# Patient Record
Sex: Female | Born: 1960 | Hispanic: Yes | Smoking: Former smoker
Health system: Southern US, Community
[De-identification: ages and names within clinical notes are randomized; demographics above are authoritative.]

## PROBLEM LIST (undated history)

## (undated) DIAGNOSIS — G894 Chronic pain syndrome: Secondary | ICD-10-CM

## (undated) DIAGNOSIS — M797 Fibromyalgia: Secondary | ICD-10-CM

## (undated) DIAGNOSIS — E119 Type 2 diabetes mellitus without complications: Secondary | ICD-10-CM

## (undated) DIAGNOSIS — I10 Essential (primary) hypertension: Secondary | ICD-10-CM

## (undated) DIAGNOSIS — J449 Chronic obstructive pulmonary disease, unspecified: Secondary | ICD-10-CM

## (undated) HISTORY — PX: COLONOSCOPY: SHX174

## (undated) HISTORY — PX: ESOPHAGOGASTRODUODENOSCOPY: SHX1529

## (undated) HISTORY — PX: HERNIA REPAIR: SHX51

## (undated) HISTORY — PX: OOPHORECTOMY: SHX86

## (undated) HISTORY — PX: APPENDECTOMY: SHX54

## (undated) HISTORY — PX: TUBAL LIGATION: SHX77

---

## 2011-07-21 DIAGNOSIS — K219 Gastro-esophageal reflux disease without esophagitis: Secondary | ICD-10-CM | POA: Insufficient documentation

## 2011-07-21 DIAGNOSIS — K5790 Diverticulosis of intestine, part unspecified, without perforation or abscess without bleeding: Secondary | ICD-10-CM | POA: Insufficient documentation

## 2011-07-21 DIAGNOSIS — F331 Major depressive disorder, recurrent, moderate: Secondary | ICD-10-CM | POA: Insufficient documentation

## 2011-07-21 DIAGNOSIS — M797 Fibromyalgia: Secondary | ICD-10-CM | POA: Insufficient documentation

## 2012-03-01 DIAGNOSIS — Z720 Tobacco use: Secondary | ICD-10-CM | POA: Insufficient documentation

## 2012-07-11 DIAGNOSIS — J449 Chronic obstructive pulmonary disease, unspecified: Secondary | ICD-10-CM | POA: Diagnosis present

## 2012-08-28 DIAGNOSIS — K432 Incisional hernia without obstruction or gangrene: Secondary | ICD-10-CM | POA: Insufficient documentation

## 2013-03-13 DIAGNOSIS — F1190 Opioid use, unspecified, uncomplicated: Secondary | ICD-10-CM | POA: Insufficient documentation

## 2013-03-13 DIAGNOSIS — M51369 Other intervertebral disc degeneration, lumbar region without mention of lumbar back pain or lower extremity pain: Secondary | ICD-10-CM | POA: Insufficient documentation

## 2013-03-13 DIAGNOSIS — T1491XA Suicide attempt, initial encounter: Secondary | ICD-10-CM | POA: Insufficient documentation

## 2013-03-13 DIAGNOSIS — M5136 Other intervertebral disc degeneration, lumbar region: Secondary | ICD-10-CM | POA: Insufficient documentation

## 2013-03-20 HISTORY — PX: EPIDURAL BLOOD PATCH: SHX1517

## 2013-05-01 DIAGNOSIS — E1165 Type 2 diabetes mellitus with hyperglycemia: Secondary | ICD-10-CM | POA: Insufficient documentation

## 2013-05-01 DIAGNOSIS — E119 Type 2 diabetes mellitus without complications: Secondary | ICD-10-CM | POA: Insufficient documentation

## 2013-05-01 DIAGNOSIS — Z794 Long term (current) use of insulin: Secondary | ICD-10-CM | POA: Insufficient documentation

## 2014-12-09 DIAGNOSIS — N3946 Mixed incontinence: Secondary | ICD-10-CM | POA: Insufficient documentation

## 2015-04-20 DIAGNOSIS — M87051 Idiopathic aseptic necrosis of right femur: Secondary | ICD-10-CM | POA: Insufficient documentation

## 2015-04-20 DIAGNOSIS — M87052 Idiopathic aseptic necrosis of left femur: Secondary | ICD-10-CM

## 2015-11-11 DIAGNOSIS — G4719 Other hypersomnia: Secondary | ICD-10-CM | POA: Insufficient documentation

## 2016-02-18 DIAGNOSIS — Z6839 Body mass index (BMI) 39.0-39.9, adult: Secondary | ICD-10-CM | POA: Insufficient documentation

## 2016-02-18 DIAGNOSIS — E66813 Obesity, class 3: Secondary | ICD-10-CM | POA: Insufficient documentation

## 2016-06-11 DIAGNOSIS — K635 Polyp of colon: Secondary | ICD-10-CM | POA: Insufficient documentation

## 2016-10-06 DIAGNOSIS — M461 Sacroiliitis, not elsewhere classified: Secondary | ICD-10-CM | POA: Insufficient documentation

## 2017-01-12 DIAGNOSIS — K439 Ventral hernia without obstruction or gangrene: Secondary | ICD-10-CM | POA: Insufficient documentation

## 2017-01-24 DIAGNOSIS — M2550 Pain in unspecified joint: Secondary | ICD-10-CM | POA: Insufficient documentation

## 2017-03-28 ENCOUNTER — Emergency Department
Admission: EM | Admit: 2017-03-28 | Discharge: 2017-03-28 | Disposition: A | Discharge status: Home / Self Care | Payer: Medicare (Managed Care) | Attending: Emergency Medicine | Admitting: Emergency Medicine

## 2017-03-28 ENCOUNTER — Encounter: Payer: Self-pay | Admitting: Emergency Medicine

## 2017-03-28 ENCOUNTER — Emergency Department: Payer: Medicare (Managed Care)

## 2017-03-28 DIAGNOSIS — J441 Chronic obstructive pulmonary disease with (acute) exacerbation: Secondary | ICD-10-CM

## 2017-03-28 DIAGNOSIS — E119 Type 2 diabetes mellitus without complications: Secondary | ICD-10-CM | POA: Insufficient documentation

## 2017-03-28 DIAGNOSIS — I10 Essential (primary) hypertension: Secondary | ICD-10-CM | POA: Insufficient documentation

## 2017-03-28 DIAGNOSIS — J45909 Unspecified asthma, uncomplicated: Secondary | ICD-10-CM | POA: Insufficient documentation

## 2017-03-28 DIAGNOSIS — Z87891 Personal history of nicotine dependence: Secondary | ICD-10-CM | POA: Insufficient documentation

## 2017-03-28 HISTORY — DX: Chronic obstructive pulmonary disease, unspecified: J44.9

## 2017-03-28 HISTORY — DX: Type 2 diabetes mellitus without complications: E11.9

## 2017-03-28 LAB — COMPREHENSIVE METABOLIC PANEL
ALBUMIN: 3.6 g/dL (ref 3.5–5.0)
ALT: 31 U/L (ref 14–54)
ANION GAP: 9 (ref 5–15)
AST: 51 U/L — ABNORMAL HIGH (ref 15–41)
Alkaline Phosphatase: 105 U/L (ref 38–126)
BILIRUBIN TOTAL: 1 mg/dL (ref 0.3–1.2)
BUN: 7 mg/dL (ref 6–20)
CHLORIDE: 101 mmol/L (ref 101–111)
CO2: 24 mmol/L (ref 22–32)
Calcium: 8.5 mg/dL — ABNORMAL LOW (ref 8.9–10.3)
Creatinine, Ser: 0.65 mg/dL (ref 0.44–1.00)
GFR calc Af Amer: >60 mL/min (ref >60)
GFR calc non Af Amer: >60 mL/min (ref >60)
GLUCOSE: 132 mg/dL — AB (ref 65–99)
POTASSIUM: 3.6 mmol/L (ref 3.5–5.1)
SODIUM: 134 mmol/L — AB (ref 135–145)
TOTAL PROTEIN: 8.9 g/dL — AB (ref 6.5–8.1)

## 2017-03-28 LAB — BLOOD GAS, VENOUS
Acid-Base Excess: 1.7 mmol/L (ref 0.0–2.0)
BICARBONATE: 25.8 mmol/L (ref 20.0–28.0)
O2 Saturation: 95.9 %
PATIENT TEMPERATURE: 37
PO2 VEN: 78 mmHg — AB (ref 32.0–45.0)
pCO2, Ven: 38 mmHg — ABNORMAL LOW (ref 44.0–60.0)
pH, Ven: 7.44 — ABNORMAL HIGH (ref 7.250–7.430)

## 2017-03-28 LAB — CBC WITH DIFFERENTIAL/PLATELET
BASOS PCT: 0 %
Basophils Absolute: 0 10*3/uL (ref 0–0.1)
EOS ABS: 0.2 10*3/uL (ref 0–0.7)
EOS PCT: 1 %
HEMATOCRIT: 37.3 % (ref 35.0–47.0)
Hemoglobin: 12.8 g/dL (ref 12.0–16.0)
Lymphocytes Relative: 18 %
Lymphs Abs: 2.2 10*3/uL (ref 1.0–3.6)
MCH: 30.3 pg (ref 26.0–34.0)
MCHC: 34.3 g/dL (ref 32.0–36.0)
MCV: 88.4 fL (ref 80.0–100.0)
MONO ABS: 0.6 10*3/uL (ref 0.2–0.9)
MONOS PCT: 5 %
Neutro Abs: 9.3 10*3/uL — ABNORMAL HIGH (ref 1.4–6.5)
Neutrophils Relative %: 76 %
PLATELETS: 163 10*3/uL (ref 150–440)
RBC: 4.22 MIL/uL (ref 3.80–5.20)
RDW: 13.8 % (ref 11.5–14.5)
WBC: 12.4 10*3/uL — ABNORMAL HIGH (ref 3.6–11.0)

## 2017-03-28 MED ORDER — AZITHROMYCIN 250 MG PO TABS: ORAL_TABLET | ORAL | 0 refills | Status: DC

## 2017-03-28 MED ORDER — IPRATROPIUM-ALBUTEROL 0.5-2.5 (3) MG/3ML IN SOLN
3.0000 mL | Freq: Once | RESPIRATORY_TRACT | Status: AC
  Administered 2017-03-28: 3 mL via RESPIRATORY_TRACT
  Filled 2017-03-28: qty 3

## 2017-03-28 MED ORDER — MAGNESIUM SULFATE 2 GM/50ML IV SOLN
2.0000 g | Freq: Once | INTRAVENOUS | Status: AC
  Administered 2017-03-28: 2 g via INTRAVENOUS
  Filled 2017-03-28: qty 50

## 2017-03-28 MED ORDER — PREDNISONE 10 MG PO TABS: 50.0000 mg | ORAL_TABLET | Freq: Every day | ORAL | 0 refills | Status: DC

## 2017-03-28 MED ORDER — HALOPERIDOL LACTATE 5 MG/ML IJ SOLN
5.0000 mg | Freq: Once | INTRAMUSCULAR | Status: AC
  Administered 2017-03-28: 5 mg via INTRAVENOUS

## 2017-03-28 MED ORDER — ALBUTEROL SULFATE HFA 108 (90 BASE) MCG/ACT IN AERS: 2.0000 | INHALATION_SPRAY | Freq: Four times a day (QID) | RESPIRATORY_TRACT | 2 refills | Status: DC | PRN

## 2017-03-28 MED ORDER — SPACER/AERO CHAMBER MOUTHPIECE MISC: 1.0000 [IU] | 0 refills | Status: DC | PRN

## 2017-03-28 MED ORDER — METHYLPREDNISOLONE SODIUM SUCC 125 MG IJ SOLR
125.0000 mg | Freq: Once | INTRAMUSCULAR | Status: AC
  Administered 2017-03-28: 125 mg via INTRAVENOUS
  Filled 2017-03-28: qty 2

## 2017-03-28 MED ORDER — HALOPERIDOL LACTATE 5 MG/ML IJ SOLN
INTRAMUSCULAR | Status: AC
  Filled 2017-03-28: qty 1

## 2017-03-28 NOTE — Discharge Instructions (Signed)
Please take all of your medications as prescribed and follow-up with the primary care physician in 2 days for recheck. Return to the emergency department for any concerns.  It was a pleasure to take care of you today, and thank you for coming to our emergency department.  If you have any questions or concerns before leaving please ask the nurse to grab me and I'm more than happy to go through your aftercare instructions again.  If you were prescribed any opioid pain medication today such as Norco, Vicodin, Percocet, morphine, hydrocodone, or oxycodone please make sure you do not drive when you are taking this medication as it can alter your ability to drive safely.  If you have any concerns once you are home that you are not improving or are in fact getting worse before you can make it to your follow-up appointment, please do not hesitate to call 911 and come back for further evaluation.  Darel Hong MD  Results for orders placed or performed during the hospital encounter of 03/28/17  CBC with Differential  Result Value Ref Range   WBC 12.4 (H) 3.6 - 11.0 K/uL   RBC 4.22 3.80 - 5.20 MIL/uL   Hemoglobin 12.8 12.0 - 16.0 g/dL   HCT 37.3 35.0 - 47.0 %   MCV 88.4 80.0 - 100.0 fL   MCH 30.3 26.0 - 34.0 pg   MCHC 34.3 32.0 - 36.0 g/dL   RDW 13.8 11.5 - 14.5 %   Platelets 163 150 - 440 K/uL   Neutrophils Relative % 76 %   Neutro Abs 9.3 (H) 1.4 - 6.5 K/uL   Lymphocytes Relative 18 %   Lymphs Abs 2.2 1.0 - 3.6 K/uL   Monocytes Relative 5 %   Monocytes Absolute 0.6 0.2 - 0.9 K/uL   Eosinophils Relative 1 %   Eosinophils Absolute 0.2 0 - 0.7 K/uL   Basophils Relative 0 %   Basophils Absolute 0.0 0 - 0.1 K/uL  Comprehensive metabolic panel  Result Value Ref Range   Sodium 134 (L) 135 - 145 mmol/L   Potassium 3.6 3.5 - 5.1 mmol/L   Chloride 101 101 - 111 mmol/L   CO2 24 22 - 32 mmol/L   Glucose, Bld 132 (H) 65 - 99 mg/dL   BUN 7 6 - 20 mg/dL   Creatinine, Ser 0.65 0.44 - 1.00 mg/dL   Calcium 8.5 (L) 8.9 - 10.3 mg/dL   Total Protein 8.9 (H) 6.5 - 8.1 g/dL   Albumin 3.6 3.5 - 5.0 g/dL   AST 51 (H) 15 - 41 U/L   ALT 31 14 - 54 U/L   Alkaline Phosphatase 105 38 - 126 U/L   Total Bilirubin 1.0 0.3 - 1.2 mg/dL   GFR calc non Af Amer >60 >60 mL/min   GFR calc Af Amer >60 >60 mL/min   Anion gap 9 5 - 15  Blood gas, venous  Result Value Ref Range   pH, Ven 7.44 (H) 7.250 - 7.430   pCO2, Ven 38 (L) 44.0 - 60.0 mmHg   pO2, Ven 78.0 (H) 32.0 - 45.0 mmHg   Bicarbonate 25.8 20.0 - 28.0 mmol/L   Acid-Base Excess 1.7 0.0 - 2.0 mmol/L   O2 Saturation 95.9 %   Patient temperature 37.0    Collection site VEIN    Sample type VENOUS    Dg Chest Port 1 View  Result Date: 03/28/2017 CLINICAL DATA:  Shortness of breath with cough for 3 weeks. History of COPD. EXAM:  PORTABLE CHEST 1 VIEW COMPARISON:  None. FINDINGS: Heart size is within normal limits. There are mild, but diffuse BILATERAL pulmonary opacities suggesting vascular congestion. Overt pulmonary edema is not present. Viral pneumonitis is possible. No effusion or pneumothorax. No bony abnormality. IMPRESSION: Mild diffuse BILATERAL pulmonary opacities which could represent vascular congestion or viral pneumonitis. No focal consolidation or frank pulmonary edema. Consider further evaluation with two-view upright PA and lateral chest when the patient is stable. Electronically Signed   By: Staci Righter M.D.   On: 03/28/2017 21:47

## 2017-03-28 NOTE — ED Provider Notes (Signed)
[facility] Emergency Department Provider Note  ____________________________________________   First MD Initiated Contact with Patient 03/28/17 2120     (approximate)  I have reviewed the triage vital signs and the nursing notes.   HISTORY  Chief Complaint Shortness of Breath    HPI Kelly Dillon is a 56 y.o. female who comes to the emergency department via EMS with roughly 24 hours of worsening shortness of breath and anxiety. She has a past medical history of asthma and COPD as well as chronic pain and fibromyalgia. When EMS arrived she was hyperventilating and not moving good air severe for 2 DuoNeb nebs which they said improved.She also reports a significant amount of stress at home. She reports moderate severity sharp chest pain worse when she coughs improved with rest. Nonradiating. She denies fevers or chills. She does report increasing nonproductive cough recently.   Past Medical History:  Diagnosis Date  . COPD (chronic obstructive pulmonary disease) ([place])   . Diabetes mellitus without complication ([place])   . Hypertension     Patient Active Problem List   Diagnosis Date Noted  . Acute exacerbation of COPD with asthma ([place]) 03/29/2017    Past Surgical History:  Procedure Laterality Date  . APPENDECTOMY    . HERNIA REPAIR    . TUBAL LIGATION      Prior to Admission medications   Medication Sig Start Date End Date Taking? Authorizing Provider  ADVAIR DISKUS 250-50 MCG/DOSE AEPB Inhale 1 puff into the lungs 2 (two) times daily. 01/19/17   [provider]  albuterol (PROVENTIL HFA;VENTOLIN HFA) 108 (90 Base) MCG/ACT inhaler Inhale 2 puffs into the lungs every 6 (six) hours as needed for wheezing or shortness of breath. 03/28/17   Darel Hong, MD  ipratropium (ATROVENT HFA) 17 MCG/ACT inhaler Inhale 2 puffs into the lungs 4 (four) times daily as needed. 02/18/16   [provider]  LYRICA 200 MG capsule Take 1 capsule by mouth 3  (three) times daily as needed. 02/22/17   [provider]  metFORMIN (GLUCOPHAGE) 1000 MG tablet Take 1 tablet by mouth 2 (two) times daily. 01/12/17   [provider]  methocarbamol (ROBAXIN) 500 MG tablet Take 1 tablet by mouth 3 (three) times daily as needed. 02/27/17   [provider]  morphine (MS CONTIN) 30 MG 12 hr tablet Take 1 tablet by mouth every 12 (twelve) hours. 02/13/17 04/14/17  [provider]  morphine (MSIR) 15 MG tablet Take 1 tablet by mouth 2 (two) times daily. 02/13/17 04/14/17  [provider]  omeprazole (PRILOSEC) 40 MG capsule Take 1 capsule by mouth daily. 02/18/17   [provider]  oxybutynin (DITROPAN) 5 MG tablet Take 1 tablet by mouth 2 (two) times daily. 02/13/17 02/13/18  [provider]  predniSONE (DELTASONE) 10 MG tablet Take 5 tablets (50 mg total) by mouth daily. Patient not taking: Reported on 03/29/2017 03/28/17 04/02/17  Darel Hong, MD  Spacer/Aero Chamber Mouthpiece MISC 1 Units by Does not apply route every 4 (four) hours as needed (wheezing). 03/28/17   Darel Hong, MD  traZODone (DESYREL) 50 MG tablet Take 1 tablet by mouth daily. 07/12/16   [provider]  venlafaxine XR (EFFEXOR-XR) 150 MG 24 hr capsule Take 1 capsule by mouth daily. 03/19/17   [provider]  zolpidem (AMBIEN) 5 MG tablet Take 1 tablet by mouth at bedtime. 03/19/17   [provider]    Allergies Patient has no known allergies.  No family  history on file.  Social History Social History  Substance Use Topics  . Smoking status: Former Smoker    Types: Cigarettes    Quit date: 02/19/2017  . Smokeless tobacco: Never Used  . Alcohol use No    Review of Systems Constitutional: No fever/chills Eyes: No visual changes. ENT: No sore throat. Cardiovascular: Positive chest pain. Respiratory: Positive shortness of breath. Gastrointestinal: No abdominal pain.  No nausea, no vomiting.  No diarrhea.  No  constipation. Genitourinary: Negative for dysuria. Musculoskeletal: Negative for back pain. Skin: Negative for rash. Neurological: Negative for headaches, focal weakness or numbness.  10-point ROS otherwise negative.  ____________________________________________   PHYSICAL EXAM:  VITAL SIGNS: ED Triage Vitals  Enc Vitals Group     BP      Pulse      Resp      Temp      Temp src      SpO2      Weight      Height      Head Circumference      Peak Flow      Pain Score      Pain Loc      Pain Edu?      Excl. in [place]?     Constitutional: Alert and oriented x 4 Anxious appearing tearful hyperventilating although able to speak in full clear sentences Eyes: PERRL EOMI. Head: Atraumatic. Nose: No congestion/rhinnorhea. Mouth/Throat: No trismus Neck: No stridor.   Cardiovascular: Tachycardic rate, regular rhythm. Grossly normal heart sounds.  Good peripheral circulation. Respiratory: Increased respiratory effort using mild accessory muscles wheezes throughout but moving good amounts of air Gastrointestinal: Soft nontender Musculoskeletal: No lower extremity edema   Neurologic:   No gross focal neurologic deficits are appreciated. Skin:  Skin is warm, dry and intact. No rash noted. Psychiatric: Anxious appearing    ____ ____________________________________________   LABS (all labs ordered are listed, but only abnormal results are displayed)  Labs Reviewed  CBC WITH DIFFERENTIAL/PLATELET - Abnormal; Notable for the following:       Result Value   WBC 12.4 (*)    Neutro Abs 9.3 (*)    All other components within normal limits  COMPREHENSIVE METABOLIC PANEL - Abnormal; Notable for the following:    Sodium 134 (*)    Glucose, Bld 132 (*)    Calcium 8.5 (*)    Total Protein 8.9 (*)    AST 51 (*)    All other components within normal limits  BLOOD GAS, VENOUS - Abnormal; Notable for the following:    pH, Ven 7.44 (*)    pCO2, Ven 38 (*)    pO2, Ven 78.0 (*)    All  other components within normal limits    Elevated white count is nonspecific and is likely secondary to stress __________________________________________  EKG  ED ECG REPORT I, Darel Hong, the attending physician, personally viewed and interpreted this ECG.  Date: 03/31/2017 Rate: 120 Rhythm: Sinus tachycardia QRS Axis: normal Intervals: normal ST/T Wave abnormalities: normal Conduction Disturbances: none Narrative Interpretation: Wavy baseline makes interpretation difficult but unremarkable largely  ____________________________________________  RADIOLOGY  IMPRESSION: Mild diffuse BILATERAL pulmonary opacities which could represent vascular congestion or viral pneumonitis. No focal consolidation or frank pulmonary edema.  Could be consistent with COPD exacerbation ____________________________________________   PROCEDURES  Procedure(s) performed: no  Procedures  Critical Care performed: no  Observation: no ____________________________________________   INITIAL IMPRESSION / ASSESSMENT AND PLAN / ED COURSE  Pertinent labs & imaging  results that were available during my care of the patient were reviewed by me and considered in my medical decision making (see chart for details).  According to EMS when they first found the patient on scene she appeared much worse than she does now after they've given her 2 nebulizations. She is certainly quite wheezy and hyperventilating. I will give her 3 more nebulization treatments along with Solu-Medrol and a touch of haloperidol for her anxiety. Disposition depending on response to treatment.  The patient feels improved after several more nebulizations would like to go home. Think this is reasonable as she is saturating well and has normal work of breathing. I will discharge her home with primary care follow-up and a short course of steroids and antibiotics. The patient verbalized understanding and agreement with the plan.        ____________________________________________   FINAL CLINICAL IMPRESSION(S) / ED DIAGNOSES  Final diagnoses:  COPD exacerbation ([place])      NEW MEDICATIONS STARTED DURING THIS VISIT:  Discharge Medication List as of 03/28/2017 10:29 PM    START taking these medications   Details  albuterol (PROVENTIL HFA;VENTOLIN HFA) 108 (90 Base) MCG/ACT inhaler Inhale 2 puffs into the lungs every 6 (six) hours as needed for wheezing or shortness of breath., Starting Tue 03/28/2017, Print    predniSONE (DELTASONE) 10 MG tablet Take 5 tablets (50 mg total) by mouth daily., Starting Tue 03/28/2017, Until Sun 04/02/2017, Print    Spacer/Aero Chamber Mouthpiece MISC 1 Units by Does not apply route every 4 (four) hours as needed (wheezing)., Starting Tue 03/28/2017, Print    azithromycin (ZITHROMAX Z-PAK) 250 MG tablet Take 2 tablets (500 mg) on  Day 1,  followed by 1 tablet (250 mg) once daily on Days 2 through 5., Print         Note:  This document was prepared using Dragon voice recognition software and may include unintentional dictation errors.     Darel Hong, MD 03/31/17 2217

## 2017-03-29 ENCOUNTER — Encounter: Payer: Self-pay | Admitting: *Deleted

## 2017-03-29 ENCOUNTER — Inpatient Hospital Stay
Admission: EM | Admit: 2017-03-29 | Discharge: 2017-04-01 | DRG: 193 | Disposition: A | Discharge status: Home Health | Payer: Medicare (Managed Care) | Attending: Internal Medicine | Admitting: Internal Medicine

## 2017-03-29 ENCOUNTER — Emergency Department: Payer: Medicare (Managed Care)

## 2017-03-29 DIAGNOSIS — I10 Essential (primary) hypertension: Secondary | ICD-10-CM | POA: Diagnosis present

## 2017-03-29 DIAGNOSIS — J9621 Acute and chronic respiratory failure with hypoxia: Secondary | ICD-10-CM | POA: Diagnosis present

## 2017-03-29 DIAGNOSIS — F329 Major depressive disorder, single episode, unspecified: Secondary | ICD-10-CM | POA: Diagnosis present

## 2017-03-29 DIAGNOSIS — J189 Pneumonia, unspecified organism: Principal | ICD-10-CM | POA: Diagnosis present

## 2017-03-29 DIAGNOSIS — E1140 Type 2 diabetes mellitus with diabetic neuropathy, unspecified: Secondary | ICD-10-CM | POA: Diagnosis present

## 2017-03-29 DIAGNOSIS — J441 Chronic obstructive pulmonary disease with (acute) exacerbation: Secondary | ICD-10-CM | POA: Diagnosis present

## 2017-03-29 DIAGNOSIS — Z6841 Body Mass Index (BMI) 40.0 and over, adult: Secondary | ICD-10-CM

## 2017-03-29 DIAGNOSIS — G8929 Other chronic pain: Secondary | ICD-10-CM | POA: Diagnosis present

## 2017-03-29 DIAGNOSIS — J449 Chronic obstructive pulmonary disease, unspecified: Secondary | ICD-10-CM | POA: Diagnosis present

## 2017-03-29 DIAGNOSIS — Z23 Encounter for immunization: Secondary | ICD-10-CM | POA: Diagnosis present

## 2017-03-29 DIAGNOSIS — Z87891 Personal history of nicotine dependence: Secondary | ICD-10-CM

## 2017-03-29 DIAGNOSIS — R0600 Dyspnea, unspecified: Secondary | ICD-10-CM

## 2017-03-29 DIAGNOSIS — M797 Fibromyalgia: Secondary | ICD-10-CM | POA: Diagnosis present

## 2017-03-29 DIAGNOSIS — R32 Unspecified urinary incontinence: Secondary | ICD-10-CM | POA: Diagnosis present

## 2017-03-29 HISTORY — DX: Essential (primary) hypertension: I10

## 2017-03-29 LAB — COMPREHENSIVE METABOLIC PANEL
ALBUMIN: 3.7 g/dL (ref 3.5–5.0)
ALK PHOS: 102 U/L (ref 38–126)
ALT: 31 U/L (ref 14–54)
ANION GAP: 7 (ref 5–15)
AST: 48 U/L — ABNORMAL HIGH (ref 15–41)
BUN: 10 mg/dL (ref 6–20)
CALCIUM: 9.2 mg/dL (ref 8.9–10.3)
CHLORIDE: 106 mmol/L (ref 101–111)
CO2: 25 mmol/L (ref 22–32)
Creatinine, Ser: 0.54 mg/dL (ref 0.44–1.00)
GFR calc Af Amer: >60 mL/min (ref >60)
GFR calc non Af Amer: >60 mL/min (ref >60)
GLUCOSE: 176 mg/dL — AB (ref 65–99)
POTASSIUM: 3.7 mmol/L (ref 3.5–5.1)
SODIUM: 138 mmol/L (ref 135–145)
Total Bilirubin: 1 mg/dL (ref 0.3–1.2)
Total Protein: 9.5 g/dL — ABNORMAL HIGH (ref 6.5–8.1)

## 2017-03-29 LAB — TROPONIN I: Troponin I: <0.03 ng/mL (ref <0.03)

## 2017-03-29 LAB — GLUCOSE, CAPILLARY
GLUCOSE-CAPILLARY: 170 mg/dL — AB (ref 65–99)
Glucose-Capillary: 165 mg/dL — ABNORMAL HIGH (ref 65–99)

## 2017-03-29 LAB — CBC
HCT: 43.9 % (ref 35.0–47.0)
HEMOGLOBIN: 14.9 g/dL (ref 12.0–16.0)
MCH: 30.5 pg (ref 26.0–34.0)
MCHC: 33.9 g/dL (ref 32.0–36.0)
MCV: 90.1 fL (ref 80.0–100.0)
PLATELETS: 186 10*3/uL (ref 150–440)
RBC: 4.87 MIL/uL (ref 3.80–5.20)
RDW: 14.4 % (ref 11.5–14.5)
WBC: 15 10*3/uL — ABNORMAL HIGH (ref 3.6–11.0)

## 2017-03-29 LAB — TSH: TSH: 0.701 u[IU]/mL (ref 0.350–4.500)

## 2017-03-29 MED ORDER — ONDANSETRON HCL 4 MG/2ML IJ SOLN
4.0000 mg | Freq: Four times a day (QID) | INTRAMUSCULAR | Status: DC | PRN
  Administered 2017-03-30 – 2017-03-31 (×2): 4 mg via INTRAVENOUS
  Filled 2017-03-29 (×2): qty 2

## 2017-03-29 MED ORDER — ACETAMINOPHEN 650 MG RE SUPP: 650.0000 mg | Freq: Four times a day (QID) | RECTAL | Status: DC | PRN

## 2017-03-29 MED ORDER — MORPHINE SULFATE ER 15 MG PO TBCR: 15.0000 mg | EXTENDED_RELEASE_TABLET | Freq: Two times a day (BID) | ORAL | Status: DC

## 2017-03-29 MED ORDER — LORAZEPAM 2 MG/ML IJ SOLN
INTRAMUSCULAR | Status: AC
  Filled 2017-03-29: qty 1

## 2017-03-29 MED ORDER — PNEUMOCOCCAL VAC POLYVALENT 25 MCG/0.5ML IJ INJ
0.5000 mL | INJECTION | INTRAMUSCULAR | Status: AC
  Administered 2017-03-31: 0.5 mL via INTRAMUSCULAR
  Filled 2017-03-29: qty 0.5

## 2017-03-29 MED ORDER — TIOTROPIUM BROMIDE MONOHYDRATE 18 MCG IN CAPS
18.0000 ug | ORAL_CAPSULE | Freq: Every day | RESPIRATORY_TRACT | Status: DC
  Administered 2017-03-29: 18 ug via RESPIRATORY_TRACT
  Filled 2017-03-29: qty 5

## 2017-03-29 MED ORDER — CEFTRIAXONE SODIUM IN DEXTROSE 20 MG/ML IV SOLN
1.0000 g | INTRAVENOUS | Status: DC
  Filled 2017-03-29: qty 50

## 2017-03-29 MED ORDER — METHYLPREDNISOLONE SODIUM SUCC 125 MG IJ SOLR
60.0000 mg | Freq: Four times a day (QID) | INTRAMUSCULAR | Status: DC
  Administered 2017-03-29 – 2017-04-01 (×12): 60 mg via INTRAVENOUS
  Filled 2017-03-29 (×12): qty 2

## 2017-03-29 MED ORDER — LORAZEPAM 2 MG/ML IJ SOLN
0.5000 mg | Freq: Once | INTRAMUSCULAR | Status: AC
  Administered 2017-03-29: 0.5 mg via INTRAVENOUS
  Filled 2017-03-29: qty 1

## 2017-03-29 MED ORDER — GUAIFENESIN ER 600 MG PO TB12
600.0000 mg | ORAL_TABLET | Freq: Two times a day (BID) | ORAL | Status: DC
  Administered 2017-03-29 – 2017-04-01 (×6): 600 mg via ORAL
  Filled 2017-03-29 (×6): qty 1

## 2017-03-29 MED ORDER — GUAIFENESIN-CODEINE 100-10 MG/5ML PO SOLN
10.0000 mL | Freq: Four times a day (QID) | ORAL | Status: DC | PRN
  Administered 2017-03-29 – 2017-04-01 (×5): 10 mL via ORAL
  Filled 2017-03-29 (×6): qty 10

## 2017-03-29 MED ORDER — ZOLPIDEM TARTRATE 5 MG PO TABS
5.0000 mg | ORAL_TABLET | Freq: Every evening | ORAL | Status: DC | PRN
  Administered 2017-03-29 – 2017-03-31 (×3): 5 mg via ORAL
  Filled 2017-03-29 (×3): qty 1

## 2017-03-29 MED ORDER — INSULIN ASPART 100 UNIT/ML ~~LOC~~ SOLN
0.0000 [IU] | Freq: Three times a day (TID) | SUBCUTANEOUS | Status: DC
  Administered 2017-03-29 – 2017-03-30 (×2): 2 [IU] via SUBCUTANEOUS
  Administered 2017-03-30: 3 [IU] via SUBCUTANEOUS
  Administered 2017-03-30 – 2017-03-31 (×2): 5 [IU] via SUBCUTANEOUS
  Administered 2017-03-31 (×2): 3 [IU] via SUBCUTANEOUS
  Administered 2017-04-01: 5 [IU] via SUBCUTANEOUS
  Administered 2017-04-01: 2 [IU] via SUBCUTANEOUS
  Filled 2017-03-29: qty 3
  Filled 2017-03-29 (×2): qty 2
  Filled 2017-03-29 (×2): qty 3
  Filled 2017-03-29 (×3): qty 5
  Filled 2017-03-29: qty 2

## 2017-03-29 MED ORDER — IPRATROPIUM-ALBUTEROL 0.5-2.5 (3) MG/3ML IN SOLN
3.0000 mL | Freq: Once | RESPIRATORY_TRACT | Status: AC
  Administered 2017-03-29: 3 mL via RESPIRATORY_TRACT
  Filled 2017-03-29: qty 3

## 2017-03-29 MED ORDER — IPRATROPIUM-ALBUTEROL 0.5-2.5 (3) MG/3ML IN SOLN
3.0000 mL | Freq: Four times a day (QID) | RESPIRATORY_TRACT | Status: DC
  Administered 2017-03-29 – 2017-03-30 (×3): 3 mL via RESPIRATORY_TRACT
  Filled 2017-03-29 (×3): qty 3

## 2017-03-29 MED ORDER — NICOTINE 21 MG/24HR TD PT24
21.0000 mg | MEDICATED_PATCH | Freq: Every day | TRANSDERMAL | Status: DC
  Administered 2017-03-29 – 2017-04-01 (×4): 21 mg via TRANSDERMAL
  Filled 2017-03-29 (×4): qty 1

## 2017-03-29 MED ORDER — ENOXAPARIN SODIUM 40 MG/0.4ML ~~LOC~~ SOLN
40.0000 mg | SUBCUTANEOUS | Status: DC
  Administered 2017-03-29: 40 mg via SUBCUTANEOUS
  Filled 2017-03-29: qty 0.4

## 2017-03-29 MED ORDER — ACETAMINOPHEN 325 MG PO TABS
650.0000 mg | ORAL_TABLET | Freq: Four times a day (QID) | ORAL | Status: DC | PRN
  Administered 2017-03-30 – 2017-04-01 (×5): 650 mg via ORAL
  Filled 2017-03-29 (×5): qty 2

## 2017-03-29 MED ORDER — INSULIN ASPART 100 UNIT/ML ~~LOC~~ SOLN: 0.0000 [IU] | Freq: Three times a day (TID) | SUBCUTANEOUS | Status: DC

## 2017-03-29 MED ORDER — IOPAMIDOL (ISOVUE-370) INJECTION 76%
75.0000 mL | Freq: Once | INTRAVENOUS | Status: AC | PRN
  Administered 2017-03-29: 75 mL via INTRAVENOUS

## 2017-03-29 MED ORDER — MORPHINE SULFATE 15 MG PO TABS: 15.0000 mg | ORAL_TABLET | Freq: Two times a day (BID) | ORAL | Status: DC

## 2017-03-29 MED ORDER — DEXTROSE 5 % IV SOLN
500.0000 mg | INTRAVENOUS | Status: DC
  Administered 2017-03-30 – 2017-03-31 (×2): 500 mg via INTRAVENOUS
  Filled 2017-03-29 (×4): qty 500

## 2017-03-29 MED ORDER — CEFTRIAXONE SODIUM-DEXTROSE 1-3.74 GM-% IV SOLR
1.0000 g | Freq: Once | INTRAVENOUS | Status: AC
  Administered 2017-03-29: 1 g via INTRAVENOUS
  Filled 2017-03-29: qty 50

## 2017-03-29 MED ORDER — SODIUM CHLORIDE 0.9% FLUSH
3.0000 mL | Freq: Two times a day (BID) | INTRAVENOUS | Status: DC
  Administered 2017-03-29 – 2017-04-01 (×6): 3 mL via INTRAVENOUS

## 2017-03-29 MED ORDER — LORAZEPAM 2 MG/ML IJ SOLN
0.5000 mg | Freq: Once | INTRAMUSCULAR | Status: AC
  Administered 2017-03-29: 0.5 mg via INTRAVENOUS

## 2017-03-29 MED ORDER — CEFTRIAXONE SODIUM IN DEXTROSE 20 MG/ML IV SOLN
1.0000 g | Freq: Once | INTRAVENOUS | Status: DC
  Filled 2017-03-29: qty 50

## 2017-03-29 MED ORDER — SODIUM CHLORIDE 0.9% FLUSH: 3.0000 mL | INTRAVENOUS | Status: DC | PRN

## 2017-03-29 MED ORDER — SODIUM CHLORIDE 0.9 % IV SOLN: 250.0000 mL | INTRAVENOUS | Status: DC | PRN

## 2017-03-29 MED ORDER — AZITHROMYCIN 500 MG PO TABS
500.0000 mg | ORAL_TABLET | Freq: Once | ORAL | Status: AC
  Administered 2017-03-29: 500 mg via ORAL
  Filled 2017-03-29: qty 1

## 2017-03-29 MED ORDER — METHYLPREDNISOLONE SODIUM SUCC 125 MG IJ SOLR
125.0000 mg | Freq: Once | INTRAMUSCULAR | Status: AC
Start: 2017-03-29 — End: 2017-03-29
  Administered 2017-03-29: 125 mg via INTRAVENOUS
  Filled 2017-03-29: qty 2

## 2017-03-29 MED ORDER — MORPHINE SULFATE 15 MG PO TABS
15.0000 mg | ORAL_TABLET | Freq: Two times a day (BID) | ORAL | Status: DC
  Administered 2017-03-29 – 2017-04-01 (×6): 15 mg via ORAL
  Filled 2017-03-29 (×7): qty 1

## 2017-03-29 MED ORDER — ONDANSETRON HCL 4 MG PO TABS: 4.0000 mg | ORAL_TABLET | Freq: Four times a day (QID) | ORAL | Status: DC | PRN

## 2017-03-29 MED ORDER — MORPHINE SULFATE ER 30 MG PO TBCR
30.0000 mg | EXTENDED_RELEASE_TABLET | Freq: Two times a day (BID) | ORAL | Status: DC
  Administered 2017-03-29 – 2017-04-01 (×6): 30 mg via ORAL
  Filled 2017-03-29 (×6): qty 1

## 2017-03-29 NOTE — ED Provider Notes (Signed)
[facility] Emergency Department Provider Note  Time seen: 11:26 AM  I have reviewed the triage vital signs and the nursing notes.   HISTORY  Chief Complaint Shortness of Breath and Anxiety    HPI Kelly Dillon is a 56 y.o. female with a past medical history of asthma, COPD, diabetes, fibromyalgia, on morphine at home, who presents to the emergency department for difficulty breathing. According to the patient for the past one week she has been feeling increased shortness of breath which became worse yesterday along with some mild chest discomfort. Patient came to the emergency department was diagnosed with a likely asthma exacerbation due to a upper respiratory infection. Patient states she was discharged prednisone but has not had it filled yet. She states her shortness of breath was worse this morning so she came to the emergency department for recheck/reevaluation. Patient describes her chest discomfort as a tightness sensation ongoing for the past 2 or 3 days. Scratch or shortness of breath is moderate, ongoing for the past one week but worse over the past 2 days. She states mild cough and multiple sick family members with upper respiratory infections. Denies any fever. Denies abdominal pain, nausea vomiting or diarrhea. Patient states she has been admitted to the hospital before for asthma attacks.  Past Medical History:  Diagnosis Date  . COPD (chronic obstructive pulmonary disease) ([place])   . Diabetes mellitus without complication ([place])     There are no active problems to display for this patient.   History reviewed. No pertinent surgical history.  Prior to Admission medications   Medication Sig Start Date End Date Taking? Authorizing Provider  albuterol (PROVENTIL HFA;VENTOLIN HFA) 108 (90 Base) MCG/ACT inhaler Inhale 2 puffs into the lungs every 6 (six) hours as needed for wheezing or shortness of breath. 03/28/17   Darel Hong, MD  azithromycin (ZITHROMAX  Z-PAK) 250 MG tablet Take 2 tablets (500 mg) on  Day 1,  followed by 1 tablet (250 mg) once daily on Days 2 through 5. 03/28/17 04/02/17  Darel Hong, MD  predniSONE (DELTASONE) 10 MG tablet Take 5 tablets (50 mg total) by mouth daily. 03/28/17 04/02/17  Darel Hong, MD  Spacer/Aero Chamber Mouthpiece MISC 1 Units by Does not apply route every 4 (four) hours as needed (wheezing). 03/28/17   Darel Hong, MD    No Known Allergies  History reviewed. No pertinent family history.  Social History Social History  Substance Use Topics  . Smoking status: Former Smoker    Types: Cigarettes    Quit date: 02/19/2017  . Smokeless tobacco: Not on file  . Alcohol use No    Review of Systems Constitutional: Negative for fever. Eyes: Negative for visual changes. ENT: Negative for congestion Cardiovascular: Mild chest tightness Respiratory: Moderate shortness of breath. Moderate wheeze. Gastrointestinal: Negative for abdominal pain, vomiting and diarrhea. Genitourinary: Negative for dysuria. Musculoskeletal: No leg pain or swelling. Skin: Negative for rash. Neurological: Negative for headache All other ROS negative  ____________________________________________   PHYSICAL EXAM:  VITAL SIGNS: ED Triage Vitals  Enc Vitals Group     BP 03/29/17 1049 (!) 124/97     Pulse Rate 03/29/17 1049 (!) 104     Resp 03/29/17 1049 (!) 32     Temp 03/29/17 1049 97.6 F (36.4 C)     Temp Source 03/29/17 1049 Oral     SpO2 03/29/17 1049 93 %     Weight 03/29/17 1051 180 lb (81.6 kg)     Height 03/29/17  1051 4\' 10"  (1.473 m)     Head Circumference --      Peak Flow --      Pain Score 03/29/17 1101 0     Pain Loc --      Pain Edu? --      Excl. in [place]? --     Constitutional: Alert and oriented. Well appearing and in no distress. Eyes: Normal exam ENT   Head: Normocephalic and atraumatic.   Mouth/Throat: Mucous membranes are moist. Cardiovascular: Normal rate, regular rhythm. No  murmur Respiratory: Moderate increased respiratory effort with moderate tachypnea. Mild to moderate expiratory wheezes bilaterally. No obvious rales or rhonchi. Gastrointestinal: Soft and nontender. No distention.   Musculoskeletal: Nontender with normal range of motion in all extremities. No lower extremity tenderness or edema. Neurologic:  Normal speech and language. No gross focal neurologic deficits Skin:  Skin is warm, dry and intact.  Psychiatric: Mood and affect are normal.   ____________________________________________    EKG  EKG reviewed and interpreted by myself shows sinus tachycardia 108 bpm, narrow QRS, normal axis, normal intervals, nonspecific ST changes without ST elevation.  ____________________________________________    RADIOLOGY  IMPRESSION: 1. No evidence of pulmonary emboli within limitations of mild motion artifact. 2. Patchy ground-glass opacities throughout both lungs. This may reflect pneumonia (including viral etiologies), although noninfectious inflammatory conditions (such as eosinophilic pneumonia) are also possible.  ____________________________________________   INITIAL IMPRESSION / ASSESSMENT AND PLAN / ED COURSE  Pertinent labs & imaging results that were available during my care of the patient were reviewed by me and considered in my medical decision making (see chart for details).  Patient presents to the emergency department with increased dyspnea over the past 2-3 days, but ongoing for 1 week. Patient has a history of asthma as well as COPD. Patient states she has been using her inhalers at home without relief. She has not yet filled her steroids which she was prescribed yesterday. Patient's chest x-ray yesterday appear to show bilateral opacities which could be vascular congestion versus viral pneumonitis. Patient is given neck and hypoxic today with mild to moderate wheezes. Given the patient's equivocal chest x-ray yesterday with continued  and worsening ongoing symptoms we will obtain a CT scan of the chest today to rule out pulmonary emboli, pneumonia, etc. We will treat with DuoNeb times, Solu-Medrol, continued close monitoring the emergency department while awaiting further results.  CT shows no evidence of PE but the patient does have patchy groundglass opacities throughout both lungs which could reflect pneumonia. Patient satting in the upper 80s on room air, we will place on 2 L nasal cannula, check blood cultures start antibiotics to cover for community-acquired pneumonia and admitted to the hospital for hypoxia.  ____________________________________________   FINAL CLINICAL IMPRESSION(S) / ED DIAGNOSES  Asthma exacerbation Pneumonia Hypoxia   Harvest Dark, MD 03/29/17 1437

## 2017-03-29 NOTE — H&P (Signed)
[facility] NAME: Kelly Dillon    MR#:  [id number]  DATE OF BIRTH:  [birth year]  DATE OF ADMISSION:  03/29/2017  PRIMARY CARE PHYSICIAN: System, Pcp Not In   REQUESTING/REFERRING PHYSICIAN: Harvest Dark MD  CHIEF COMPLAINT:   Chief Complaint  Patient presents with  . Shortness of Breath  . Anxiety    HISTORY OF PRESENT ILLNESS: Kelly Dillon  is a 56 y.o. female with a known history of  COPD, diabetes type 2, essential hypertension who is presenting to the emergency room with complaint of shortness of breath ongoing for the past 1 week. She reports that she has had productive cough with greenish yellow sputum. Also progressive shortness of breath with wheezing. She came to the emergency room yesterday and was given some prednisone however she was not able to fill that. She comes back to the emergency room with complaint of worsening shortness of breath and cough. She is noted to have oxygen saturations in the 80s. She underwent a CT scan of the chest which shows bilateral infiltrates suggestive of possible atypical infection. Patient does complain of having some fevers and chills. Complains of chest pain with coughing. No denies any nausea vomiting or diarrhea.   PAST MEDICAL HISTORY:   Past Medical History:  Diagnosis Date  . COPD (chronic obstructive pulmonary disease) ([place])   . Diabetes mellitus without complication ([place])   . Hypertension     PAST SURGICAL HISTORY:  Past Surgical History:  Procedure Laterality Date  . APPENDECTOMY    . HERNIA REPAIR    . TUBAL LIGATION      SOCIAL HISTORY:  Social History  Substance Use Topics  . Smoking status: Former Smoker    Types: Cigarettes    Quit date: 02/19/2017  . Smokeless tobacco: Not on file  . Alcohol use No    FAMILY HISTORY: History reviewed. No pertinent family history.  DRUG ALLERGIES: No Known Allergies  REVIEW OF SYSTEMS:   CONSTITUTIONAL: No fever, fatigue or  weakness.  EYES: No blurred or double vision.  EARS, NOSE, AND THROAT: No tinnitus or ear pain.  RESPIRATORY: Positive cough, positive shortness of breath, positive wheezing no hemoptysis.  CARDIOVASCULAR: No chest pain, orthopnea, edema.  GASTROINTESTINAL: No nausea, vomiting, diarrhea or abdominal pain.  GENITOURINARY: No dysuria, hematuria.  ENDOCRINE: No polyuria, nocturia,  HEMATOLOGY: No anemia, easy bruising or bleeding SKIN: No rash or lesion. MUSCULOSKELETAL: No joint pain or arthritis.   NEUROLOGIC: No tingling, numbness, weakness.  PSYCHIATRY: No anxiety or depression.   MEDICATIONS AT HOME:  Prior to Admission medications   Medication Sig Start Date End Date Taking? Authorizing Provider  albuterol (PROVENTIL HFA;VENTOLIN HFA) 108 (90 Base) MCG/ACT inhaler Inhale 2 puffs into the lungs every 6 (six) hours as needed for wheezing or shortness of breath. 03/28/17   Darel Hong, MD  azithromycin (ZITHROMAX Z-PAK) 250 MG tablet Take 2 tablets (500 mg) on  Day 1,  followed by 1 tablet (250 mg) once daily on Days 2 through 5. 03/28/17 04/02/17  Darel Hong, MD  predniSONE (DELTASONE) 10 MG tablet Take 5 tablets (50 mg total) by mouth daily. 03/28/17 04/02/17  Darel Hong, MD  Spacer/Aero Chamber Mouthpiece MISC 1 Units by Does not apply route every 4 (four) hours as needed (wheezing). 03/28/17   Darel Hong, MD      PHYSICAL EXAMINATION:   VITAL SIGNS: Blood pressure 118/74, pulse (!) 106, temperature 97.6 F (36.4 C), temperature source  Oral, resp. rate 12, height 4\' 10"  (1.473 m), weight 180 lb (81.6 kg), SpO2 90 %.  GENERAL:  56 y.o.-year-old patient lying in the bed with no acute distress.  EYES: Pupils equal, round, reactive to light and accommodation. No scleral icterus. Extraocular muscles intact.  HEENT: Head atraumatic, normocephalic. Oropharynx and nasopharynx clear.  NECK:  Supple, no jugular venous distention. No thyroid enlargement, no tenderness.  LUNGS:  Bilateral wheezing throughout both lungs no sensory muscle usage no crackles  CARDIOVASCULAR: S1, S2 normal. No murmurs, rubs, or gallops.  ABDOMEN: Soft, nontender, nondistended. Bowel sounds present. No organomegaly or mass.  EXTREMITIES: No pedal edema, cyanosis, or clubbing.  NEUROLOGIC: Cranial nerves II through XII are intact. Muscle strength 5/5 in all extremities. Sensation intact. Gait not checked.  PSYCHIATRIC: The patient is alert and oriented x 3.  SKIN: No obvious rash, lesion, or ulcer.   LABORATORY PANEL:   CBC  Recent Labs Lab 03/28/17 2135 03/29/17 1145  WBC 12.4* 15.0*  HGB 12.8 14.9  HCT 37.3 43.9  PLT 163 186  MCV 88.4 90.1  MCH 30.3 30.5  MCHC 34.3 33.9  RDW 13.8 14.4  LYMPHSABS 2.2  --   MONOABS 0.6  --   EOSABS 0.2  --   BASOSABS 0.0  --    ------------------------------------------------------------------------------------------------------------------  Chemistries   Recent Labs Lab 03/28/17 2135 03/29/17 1145  NA 134* 138  K 3.6 3.7  CL 101 106  CO2 24 25  GLUCOSE 132* 176*  BUN 7 10  CREATININE 0.65 0.54  CALCIUM 8.5* 9.2  AST 51* 48*  ALT 31 31  ALKPHOS 105 102  BILITOT 1.0 1.0   ------------------------------------------------------------------------------------------------------------------ estimated creatinine clearance is 73.4 mL/min (by C-G formula based on SCr of 0.54 mg/dL). ------------------------------------------------------------------------------------------------------------------ No results for input(s): TSH, T4TOTAL, T3FREE, THYROIDAB in the last 72 hours.  Invalid input(s): FREET3   Coagulation profile No results for input(s): INR, PROTIME in the last 168 hours. ------------------------------------------------------------------------------------------------------------------- No results for input(s): DDIMER in the last 72  hours. -------------------------------------------------------------------------------------------------------------------  Cardiac Enzymes  Recent Labs Lab 03/29/17 1145  TROPONINI <0.03   ------------------------------------------------------------------------------------------------------------------ Invalid input(s): POCBNP  ---------------------------------------------------------------------------------------------------------------  Urinalysis No results found for: COLORURINE, APPEARANCEUR, LABSPEC, PHURINE, GLUCOSEU, HGBUR, BILIRUBINUR, KETONESUR, PROTEINUR, UROBILINOGEN, NITRITE, LEUKOCYTESUR   RADIOLOGY: Ct Angio Chest Pe W Or Wo Contrast  Result Date: 03/29/2017 CLINICAL DATA:  Shortness of breath, worsening over the past 2 weeks. Leukocytosis. EXAM: CT ANGIOGRAPHY CHEST WITH CONTRAST TECHNIQUE: Multidetector CT imaging of the chest was performed using the standard protocol during bolus administration of intravenous contrast. Multiplanar CT image reconstructions and MIPs were obtained to evaluate the vascular anatomy. CONTRAST:  75 mL Isovue 370 COMPARISON:  Chest radiograph 03/28/2017 FINDINGS: Cardiovascular: No pulmonary emboli are identified, although segmental and subsegmental evaluation is limited in some regions by respiratory motion artifact. There is no evidence of thoracic aortic aneurysm or dissection. The heart is normal in size. There is no pericardial effusion. Mediastinum/Nodes: No enlarged axillary, mediastinal, or hilar lymph nodes are identified. Scattered small mediastinal lymph nodes measure up to 9 mm in short axis. The thyroid and esophagus are unremarkable. Lungs/Pleura: No pleural effusion or pneumothorax. There are patchy ground-glass opacities throughout both lungs, greatest in the right upper lobe. Many of these opacities are peripherally located. There is a small focus of consolidation or atelectasis in the right middle lobe. No mass. Upper Abdomen:  Punctate calcification in the liver. Prior upper abdominal ventral hernia repair. Musculoskeletal: Mild thoracic spondylosis. Review of the MIP  images confirms the above findings. IMPRESSION: 1. No evidence of pulmonary emboli within limitations of mild motion artifact. 2. Patchy ground-glass opacities throughout both lungs. This may reflect pneumonia (including viral etiologies), although noninfectious inflammatory conditions (such as eosinophilic pneumonia) are also possible. Electronically Signed   By: Logan Bores M.D.   On: 03/29/2017 13:13   Dg Chest Port 1 View  Result Date: 03/28/2017 CLINICAL DATA:  Shortness of breath with cough for 3 weeks. History of COPD. EXAM: PORTABLE CHEST 1 VIEW COMPARISON:  None. FINDINGS: Heart size is within normal limits. There are mild, but diffuse BILATERAL pulmonary opacities suggesting vascular congestion. Overt pulmonary edema is not present. Viral pneumonitis is possible. No effusion or pneumothorax. No bony abnormality. IMPRESSION: Mild diffuse BILATERAL pulmonary opacities which could represent vascular congestion or viral pneumonitis. No focal consolidation or frank pulmonary edema. Consider further evaluation with two-view upright PA and lateral chest when the patient is stable. Electronically Signed   By: Staci Righter M.D.   On: 03/28/2017 21:47    EKG: Orders placed or performed during the hospital encounter of 03/28/17  . ED EKG  . ED EKG  . EKG 12-Lead  . EKG 12-Lead    IMPRESSION AND PLAN: Patient is a 56 year old Hispanic female with history of COPD presents with shortness of breath  1. Acute hypoxic respiratory failure This is due to acute COPD exasperation as well as possible atypical pneumonia I will treat patient with IV Solu-Medrol and antibiotics We will place patient on nebulizer therapy I will place her on Pulmicort nebs I will asked pulmonary to see Oxygen therapy We will treat with mucolytic  2. Diabetes type 2 will place on  sliding scale insulin and check hemoglobin A1c  3.  Nicotine abuse patient states she stopped smoking 3 wks ago patient still requesting nicotine patch. I continue to reinforce she should not smoke. Continue smoking cessation provided for minutes spent strongly recommended to refrain from smoking    All the records are reviewed and case discussed with ED provider. Management plans discussed with the patient, family and they are in agreement.  CODE STATUS: Code Status History    This patient does not have a recorded code status. Please follow your organizational policy for patients in this situation.       TOTAL TIME TAKING CARE OF THIS PATIENT: 55 minutes.    Dustin Flock M.D on 03/29/2017 at 3:09 PM  Between 7am to 6pm - Pager - [phone]  After 6pm go to [email or web address] - password [email or web address] [facility]  Office  [phone]  CC: Primary care physician; System, Pcp Not In

## 2017-03-29 NOTE — ED Notes (Signed)
Admitting MD in room to assess patient.  Will continue to monitor.   

## 2017-03-29 NOTE — Progress Notes (Signed)
Pt. Requests ambien 5mg  po hs to be ordered. Pt. Stated she takes this at home. Dr. Ara Kussmaul notified and ambien 5mg  qhs ordered.

## 2017-03-29 NOTE — ED Triage Notes (Signed)
Pt arrives with complaints of SOB, pt pacing in room, tachypenic, sweating, hx of anxiety, states was seen last night and told she is having an asthma attack

## 2017-03-29 NOTE — ED Notes (Signed)
Pt coming from home via EMS for SOB had duenebs PTA. Pt has hx of COPD. Pt normally on 2L [place] at home.

## 2017-03-29 NOTE — Progress Notes (Addendum)
Patient requesting morphine which she takes outpatient.  Was not ordered on admission.  On call MD Dr. Fritzi Mandes paged, and verbal order given to order morphine MS contin 30mg  po q12hrs and morphine MSIR 15mg  po q12hrs.  These are the doses that patient takes at home.  Clarise Cruz, RN

## 2017-03-29 NOTE — ED Notes (Signed)
Patient sleeping. No obvious distress at this time.

## 2017-03-29 NOTE — ED Notes (Signed)
Dr. Paduchowski in room to assess patient.  Will continue to monitor.   

## 2017-03-30 LAB — BASIC METABOLIC PANEL
ANION GAP: 6 (ref 5–15)
BUN: 15 mg/dL (ref 6–20)
CHLORIDE: 108 mmol/L (ref 101–111)
CO2: 26 mmol/L (ref 22–32)
Calcium: 9.1 mg/dL (ref 8.9–10.3)
Creatinine, Ser: 0.6 mg/dL (ref 0.44–1.00)
GFR calc non Af Amer: >60 mL/min (ref >60)
Glucose, Bld: 166 mg/dL — ABNORMAL HIGH (ref 65–99)
POTASSIUM: 3.5 mmol/L (ref 3.5–5.1)
SODIUM: 140 mmol/L (ref 135–145)

## 2017-03-30 LAB — CBC
HEMATOCRIT: 42.4 % (ref 35.0–47.0)
HEMOGLOBIN: 14.4 g/dL (ref 12.0–16.0)
MCH: 30.8 pg (ref 26.0–34.0)
MCHC: 33.9 g/dL (ref 32.0–36.0)
MCV: 91 fL (ref 80.0–100.0)
Platelets: 185 10*3/uL (ref 150–440)
RBC: 4.66 MIL/uL (ref 3.80–5.20)
RDW: 14 % (ref 11.5–14.5)
WBC: 18.5 10*3/uL — ABNORMAL HIGH (ref 3.6–11.0)

## 2017-03-30 LAB — PROCALCITONIN: Procalcitonin: <0.1 ng/mL

## 2017-03-30 LAB — GLUCOSE, CAPILLARY
GLUCOSE-CAPILLARY: 254 mg/dL — AB (ref 65–99)
GLUCOSE-CAPILLARY: 227 mg/dL — AB (ref 65–99)
Glucose-Capillary: 174 mg/dL — ABNORMAL HIGH (ref 65–99)
Glucose-Capillary: 203 mg/dL — ABNORMAL HIGH (ref 65–99)

## 2017-03-30 MED ORDER — VENLAFAXINE HCL ER 75 MG PO CP24
150.0000 mg | ORAL_CAPSULE | Freq: Every day | ORAL | Status: DC
  Administered 2017-03-30 – 2017-04-01 (×3): 150 mg via ORAL
  Filled 2017-03-30 (×3): qty 2

## 2017-03-30 MED ORDER — TRAZODONE HCL 50 MG PO TABS
50.0000 mg | ORAL_TABLET | Freq: Every day | ORAL | Status: DC
  Administered 2017-03-30 – 2017-04-01 (×3): 50 mg via ORAL
  Filled 2017-03-30 (×3): qty 1

## 2017-03-30 MED ORDER — IPRATROPIUM-ALBUTEROL 0.5-2.5 (3) MG/3ML IN SOLN
3.0000 mL | RESPIRATORY_TRACT | Status: DC
  Administered 2017-03-30 – 2017-04-01 (×12): 3 mL via RESPIRATORY_TRACT
  Filled 2017-03-30 (×12): qty 3

## 2017-03-30 MED ORDER — PREGABALIN 75 MG PO CAPS
200.0000 mg | ORAL_CAPSULE | Freq: Three times a day (TID) | ORAL | Status: DC | PRN
  Administered 2017-03-31 – 2017-04-01 (×4): 200 mg via ORAL
  Filled 2017-03-30 (×4): qty 1

## 2017-03-30 MED ORDER — DEXTROSE 5 % IV SOLN
1.0000 g | INTRAVENOUS | Status: DC
  Administered 2017-03-30 – 2017-03-31 (×2): 1 g via INTRAVENOUS
  Filled 2017-03-30 (×3): qty 10

## 2017-03-30 MED ORDER — BUDESONIDE 0.5 MG/2ML IN SUSP
0.5000 mg | Freq: Two times a day (BID) | RESPIRATORY_TRACT | Status: DC
  Administered 2017-03-30 – 2017-04-01 (×5): 0.5 mg via RESPIRATORY_TRACT
  Filled 2017-03-30 (×5): qty 2

## 2017-03-30 MED ORDER — OXYBUTYNIN CHLORIDE 5 MG PO TABS
5.0000 mg | ORAL_TABLET | Freq: Two times a day (BID) | ORAL | Status: DC
  Administered 2017-03-30 – 2017-04-01 (×4): 5 mg via ORAL
  Filled 2017-03-30 (×6): qty 1

## 2017-03-30 MED ORDER — ENOXAPARIN SODIUM 40 MG/0.4ML ~~LOC~~ SOLN
40.0000 mg | Freq: Two times a day (BID) | SUBCUTANEOUS | Status: DC
  Administered 2017-03-30 – 2017-03-31 (×4): 40 mg via SUBCUTANEOUS
  Filled 2017-03-30 (×4): qty 0.4

## 2017-03-30 NOTE — Progress Notes (Signed)
Anticoagulation monitoring(Lovenox):  55yo  F ordered Lovenox 40 mg Q24h  Filed Weights   03/29/17 1051 03/29/17 1649  Weight: 180 lb (81.6 kg) 195 lb 8 oz (88.7 kg)   BMI 40.9   Lab Results  Component Value Date   CREATININE 0.60 03/30/2017   CREATININE 0.54 03/29/2017   CREATININE 0.65 03/28/2017   Estimated Creatinine Clearance: 77 mL/min (by C-G formula based on SCr of 0.6 mg/dL). Hemoglobin & Hematocrit     Component Value Date/Time   HGB 14.4 03/30/2017 0527   HCT 42.4 03/30/2017 0527     Per Protocol for Patient with estCrcl > 30 ml/min and BMI > 40, will transition to Lovenox 40 mg Q12h.     Chinita Greenland PharmD Clinical Pharmacist 03/30/2017

## 2017-03-30 NOTE — Care Management Note (Addendum)
Case Management Note  Patient Details  Name: Kelly Dillon MRN: [id number] Date of Birth: 10-18-1961  Subjective/Objective:   Met with patient at bedside to discuss discharged planning. She lives alone but plans to go to her daughters to stay for a while. Daughter,  [name] [phone]. LM to get her address. PCP is Tim Lair [phone]) at [facility] in [place].She is on chronic O2 @ 2l. She is open to home health with no agency preference. [facility] to [facility] for [facility], PT and HHA. She has a walker. Patient has been independent with adls prior to admission.                Action/Plan: [facility] for PT, SN and HHA.   Expected Discharge Date:                  Expected Discharge Plan:  [place]  In-House Referral:     Discharge planning Services  CM Consult  Post Acute Care Choice:  Home Health Choice offered to:  Patient  DME Arranged:    DME Agency:     HH Arranged:  RN, PT, Nurse's Aide [place] Agency:  [facility]  Status of Service:  In process, will continue to follow  If discussed at Long Length of Stay Meetings, dates discussed:    Additional Comments:  Jolly Mango, RN 03/30/2017, 11:14 AM

## 2017-03-30 NOTE — Progress Notes (Signed)
Date: 03/30/2017,   MRN# [id number] Kelly Dillon 1961/02/09 Code Status:     Code Status Orders        Start     Ordered   03/29/17 1654  Full code  Continuous     03/29/17 1653    Code Status History    Date Active Date Inactive Code Status Order ID Comments User Context   This patient has a current code status but no historical code status.     Hosp day:@LENGTHOFSTAYDAYS @ Referring MD: @ATDPROV @        AdmissionWeight: 180 lb (81.6 kg)                 CurrentWeight: 195 lb 8 oz (88.7 kg)  CC: shortness of breath, wheezing and peripheral patchy infiltrates  HPI: This is 56 yr old latino lady, from [place], a smoker, on disability who is here with increase sob, wheezing and cough. She is  known to have copd/asthma. She was in the ER with like symptoms the night preceding the admission.  Since being her with present regimen she is improving. Asked to see regarding her cxr findings. That. Peripheral ground glass infiltrates. Her eosinophil count was one. No prior ct's to compare. No pleurisy or hemoptysis. No rashes, hair loss.     Past Medical History:  Diagnosis Date  . COPD (chronic obstructive pulmonary disease) ([place])   . Diabetes mellitus without complication ([place])   . Hypertension    Surgical Hx:  Past Surgical History:  Procedure Laterality Date  . APPENDECTOMY    . HERNIA REPAIR    . TUBAL LIGATION     Family Hx:  History reviewed. No pertinent family history. Social Hx:   Social History  Substance Use Topics  . Smoking status: Former Smoker    Types: Cigarettes    Quit date: 02/19/2017  . Smokeless tobacco: Never Used  . Alcohol use No   Medication:    Home Medication:    Current Medication: @CURMEDTAB @   Allergies:  Patient has no known allergies.  Review of Systems: Gen:  Denies  fever, sweats, chills HEENT: Denies blurred vision, double vision, ear pain, eye pain, hearing loss, nose bleeds, sore throat Cvc:  No dizziness, chest pain or  heaviness Resp:   Dyspnea, some better sice here,cough, wheezing, no hemoptysis Gi: Denies swallowing difficulty, stomach pain, nausea or vomiting, diarrhea, constipation, bowel incontinence Gu:  Denies bladder incontinence, burning urine Ext:   No Joint pain, stiffness or swelling Skin: No skin rash, easy bruising or bleeding or hives Endoc:  No polyuria, polydipsia , polyphagia or weight change Psych: No depression, insomnia or hallucinations  Other:  All other systems negative  Physical Examination:   VS: BP 138/86 (BP Location: Right Arm)   Pulse (!) 113   Temp 97.7 F (36.5 C) (Oral)   Resp (!) 22   Ht 4\' 10"  (1.473 m)   Wt 195 lb 8 oz (88.7 kg)   SpO2 94%   BMI 40.86 kg/m   General Appearance: No distress, coughs intermittently  Neuro: without focal findings, mental status, speech normal, alert and oriented, cranial nerves 2-12 intact, reflexes normal and symmetric, sensation grossly normal  NECK: Supple, no stridor HEENT: PERRLA, EOM intact, no ptosis, no other lesions noticed Pulmonary:.+ ve wheezing, No rales     Cardiovascular:  Normal S1,S2.  No m/r/g.   Abdomen:Benign, Soft, non-tender, No masses, hepatosplenomegaly, No lymphadenopathy Endoc: No evident thyromegaly, no signs of acromegaly or Cushing features Skin:  warm, no rashes, no ecchymosis  Extremities: normal, no cyanosis, clubbing, no edema, warm with normal capillary refill. Other findings:   Labs results:   Recent Labs     03/28/17  2135  03/29/17  1145  03/30/17  0527  HGB  12.8  14.9  14.4  HCT  37.3  43.9  42.4  MCV  88.4  90.1  91.0  WBC  12.4*  15.0*  18.5*  BUN  7  10  15   CREATININE  0.65  0.54  0.60  GLUCOSE  132*  176*  166*  CALCIUM  8.5*  9.2  9.1  ,      Rad results:   Ct Angio Chest Pe W Or Wo Contrast  Result Date: 03/29/2017 CLINICAL DATA:  Shortness of breath, worsening over the past 2 weeks. Leukocytosis. EXAM: CT ANGIOGRAPHY CHEST WITH CONTRAST TECHNIQUE: Multidetector  CT imaging of the chest was performed using the standard protocol during bolus administration of intravenous contrast. Multiplanar CT image reconstructions and MIPs were obtained to evaluate the vascular anatomy. CONTRAST:  75 mL Isovue 370 COMPARISON:  Chest radiograph 03/28/2017 FINDINGS: Cardiovascular: No pulmonary emboli are identified, although segmental and subsegmental evaluation is limited in some regions by respiratory motion artifact. There is no evidence of thoracic aortic aneurysm or dissection. The heart is normal in size. There is no pericardial effusion. Mediastinum/Nodes: No enlarged axillary, mediastinal, or hilar lymph nodes are identified. Scattered small mediastinal lymph nodes measure up to 9 mm in short axis. The thyroid and esophagus are unremarkable. Lungs/Pleura: No pleural effusion or pneumothorax. There are patchy ground-glass opacities throughout both lungs, greatest in the right upper lobe. Many of these opacities are peripherally located. There is a small focus of consolidation or atelectasis in the right middle lobe. No mass. Upper Abdomen: Punctate calcification in the liver. Prior upper abdominal ventral hernia repair. Musculoskeletal: Mild thoracic spondylosis. Review of the MIP images confirms the above findings. IMPRESSION: 1. No evidence of pulmonary emboli within limitations of mild motion artifact. 2. Patchy ground-glass opacities throughout both lungs. This may reflect pneumonia (including viral etiologies), although noninfectious inflammatory conditions (such as eosinophilic pneumonia) are also possible. Electronically Signed   By: Logan Bores M.D.   On: 03/29/2017 13:13     Assessment and Plan: Known hx of copd/asthma, on oxygen, smoker here with an exacerbation -duo nebs qid -budenoside 0.5 bid -solumederol 50 mg qid  -follow up at [facility] pulmonary -out patient pfts -reases in am dvt prophylaxis check IGE. Eosinophil count   Peripheral infiltrates. ? Etiology.  Ddx: cad pneumonia, on rocephin, ? EOS pneumonia. Doubt ( no fever, no peripheral eosinophilia, grant it she was on steroids prior to admission) -treat as cap  -out patient follow up chest xray -anticipate going home on pred taper -hold on bronch unless cxr not improving       I have personally obtained a history, examined the patient, evaluated laboratory and imaging results, formulated the assessment and plan and placed orders.  The Patient requires high complexity decision making for assessment and support, frequent evaluation and titration of therapies, application of advanced monitoring technologies and extensive interpretation of multiple databases.   Herbon Fleming,M.D. Pulmonary & Critical care Medicine [facility]

## 2017-03-30 NOTE — Progress Notes (Signed)
[facility] [place] NAME: Kelly Dillon    MR#:  [id number]  DATE OF BIRTH:  Mar 16, 1961  SUBJECTIVE:   Patient here due to shortness of breath and wheezing and noted to be in COPD exacerbation. Still having some wheezing and bronchospasm.  REVIEW OF SYSTEMS:    Review of Systems  Constitutional: Negative for chills and fever.  HENT: Negative for congestion and tinnitus.   Eyes: Negative for blurred vision and double vision.  Respiratory: Positive for cough, shortness of breath and wheezing.   Cardiovascular: Negative for chest pain, orthopnea and PND.  Gastrointestinal: Negative for abdominal pain, diarrhea, nausea and vomiting.  Genitourinary: Negative for dysuria and hematuria.  Neurological: Negative for dizziness, sensory change and focal weakness.  All other systems reviewed and are negative.   Nutrition: Heart healthy Tolerating Diet: Yes Tolerating PT: Eval noted.    DRUG ALLERGIES:  No Known Allergies  VITALS:  Blood pressure 138/86, pulse (!) 113, temperature 97.7 F (36.5 C), temperature source Oral, resp. rate (!) 22, height 4\' 10"  (1.473 m), weight 88.7 kg (195 lb 8 oz), SpO2 94 %.  PHYSICAL EXAMINATION:   Physical Exam  GENERAL:  56 y.o.-year-old patient lying in bed in no acute distress.  EYES: Pupils equal, round, reactive to light and accommodation. No scleral icterus. Extraocular muscles intact.  HEENT: Head atraumatic, normocephalic. Oropharynx and nasopharynx clear.  NECK:  Supple, no jugular venous distention. No thyroid enlargement, no tenderness.  LUNGS:Diffuse wheezing, rhonchi bilaterally, negative use of accessory muscles. No dullness to percussion. CARDIOVASCULAR: S1, S2 normal. No murmurs, rubs, or gallops.  ABDOMEN: Soft, nontender, nondistended. Bowel sounds present. No organomegaly or mass.  EXTREMITIES: No cyanosis, clubbing or edema b/l.    NEUROLOGIC: Cranial nerves II through XII are intact. No  focal Motor or sensory deficits b/l.   PSYCHIATRIC: The patient is alert and oriented x 3.  SKIN: No obvious rash, lesion, or ulcer.    LABORATORY PANEL:   CBC  Recent Labs Lab 03/30/17 0527  WBC 18.5*  HGB 14.4  HCT 42.4  PLT 185   ------------------------------------------------------------------------------------------------------------------  Chemistries   Recent Labs Lab 03/29/17 1145 03/30/17 0527  NA 138 140  K 3.7 3.5  CL 106 108  CO2 25 26  GLUCOSE 176* 166*  BUN 10 15  CREATININE 0.54 0.60  CALCIUM 9.2 9.1  AST 48*  --   ALT 31  --   ALKPHOS 102  --   BILITOT 1.0  --    ------------------------------------------------------------------------------------------------------------------  Cardiac Enzymes  Recent Labs Lab 03/29/17 1145  TROPONINI <0.03   ------------------------------------------------------------------------------------------------------------------  RADIOLOGY:  Ct Angio Chest Pe W Or Wo Contrast  Result Date: 03/29/2017 CLINICAL DATA:  Shortness of breath, worsening over the past 2 weeks. Leukocytosis. EXAM: CT ANGIOGRAPHY CHEST WITH CONTRAST TECHNIQUE: Multidetector CT imaging of the chest was performed using the standard protocol during bolus administration of intravenous contrast. Multiplanar CT image reconstructions and MIPs were obtained to evaluate the vascular anatomy. CONTRAST:  75 mL Isovue 370 COMPARISON:  Chest radiograph 03/28/2017 FINDINGS: Cardiovascular: No pulmonary emboli are identified, although segmental and subsegmental evaluation is limited in some regions by respiratory motion artifact. There is no evidence of thoracic aortic aneurysm or dissection. The heart is normal in size. There is no pericardial effusion. Mediastinum/Nodes: No enlarged axillary, mediastinal, or hilar lymph nodes are identified. Scattered small mediastinal lymph nodes measure up to 9 mm in short axis. The thyroid and esophagus are unremarkable.  Lungs/Pleura: No pleural effusion or pneumothorax. There are patchy ground-glass opacities throughout both lungs, greatest in the right upper lobe. Many of these opacities are peripherally located. There is a small focus of consolidation or atelectasis in the right middle lobe. No mass. Upper Abdomen: Punctate calcification in the liver. Prior upper abdominal ventral hernia repair. Musculoskeletal: Mild thoracic spondylosis. Review of the MIP images confirms the above findings. IMPRESSION: 1. No evidence of pulmonary emboli within limitations of mild motion artifact. 2. Patchy ground-glass opacities throughout both lungs. This may reflect pneumonia (including viral etiologies), although noninfectious inflammatory conditions (such as eosinophilic pneumonia) are also possible. Electronically Signed   By: Logan Bores M.D.   On: 03/29/2017 13:13   Dg Chest Port 1 View  Result Date: 03/28/2017 CLINICAL DATA:  Shortness of breath with cough for 3 weeks. History of COPD. EXAM: PORTABLE CHEST 1 VIEW COMPARISON:  None. FINDINGS: Heart size is within normal limits. There are mild, but diffuse BILATERAL pulmonary opacities suggesting vascular congestion. Overt pulmonary edema is not present. Viral pneumonitis is possible. No effusion or pneumothorax. No bony abnormality. IMPRESSION: Mild diffuse BILATERAL pulmonary opacities which could represent vascular congestion or viral pneumonitis. No focal consolidation or frank pulmonary edema. Consider further evaluation with two-view upright PA and lateral chest when the patient is stable. Electronically Signed   By: Staci Righter M.D.   On: 03/28/2017 21:47     ASSESSMENT AND PLAN:   56 year old female with past history of hypertension, diabetes, COPD who presented to the hospital due to shortness of breath and noted to be in COPD exacerbation.  1. COPD exacerbation-secondary to ongoing tobacco abuse and also underlying bronchitis/atypical pneumonia. -Continue IV  steroids, scheduled DuoNeb's, will add Pulmicort nebs. -Continue empiric IV antibiotics with ceftriaxone, Zithromax.  -seen by pulmonary and continue current care. Check IgE levels.   2. Chronic Pain - cont. MS contin, Morphine IR  3. DM - cont. SSI and follow BS  4. Depression - cont. Trazodone, Effexor  5. Neuropathy - cont. Lyrica  6. Urinary Incontinence - cont. Ditropan    All the records are reviewed and case discussed with Care Management/Social Worker. Management plans discussed with the patient, family and they are in agreement.  CODE STATUS: Full Code  DVT Prophylaxis: Lovenox  TOTAL TIME TAKING CARE OF THIS PATIENT: 30 minutes.   POSSIBLE D/C IN 1-2 DAYS, DEPENDING ON CLINICAL CONDITION.   Henreitta Leber M.D on 03/30/2017 at 1:31 PM  Between 7am to 6pm - Pager - [phone]  After 6pm go to [email or web address] - Proofreader  [facility]  Office  [phone]  CC: Primary care physician; System, Pcp Not In

## 2017-03-30 NOTE — Evaluation (Signed)
Physical Therapy Evaluation Patient Details Name: Kelly Dillon MRN: [id number] DOB: Feb 13, 1961 Today's Date: 03/30/2017   History of Present Illness  Pt admitted for acute exacerbation of COPD. Pt with complaints of anxiety and SOB symptoms x 1 week. History includes COPD, DM, and HTN and is currently on 2L of O2 at home.   Clinical Impression  Pt is a pleasant 56 year old female who was admitted for acute exacerbation of COPD. Pt performs bed mobility with mod I, transfers with cga, and ambulation with cga and RW. Pt demonstrates deficits with strength/endurance/mobility. Pt is getting close to baseline level and reports she feels safe to dc home whenever the time comes. Reports she will be able to stay with daughter if needed for more support. Is interested in having HHPT for further improvement with mobility status. Would benefit from skilled PT to address above deficits and promote optimal return to PLOF. Recommend transition to [place] upon discharge from acute hospitalization.       Follow Up Recommendations Home health PT;Supervision for mobility/OOB    Equipment Recommendations  None recommended by PT    Recommendations for Other Services OT consult     Precautions / Restrictions Precautions Precautions: Fall Restrictions Weight Bearing Restrictions: No      Mobility  Bed Mobility Overal bed mobility: Modified Independent             General bed mobility comments: safe technique performed with use of railing. Once seated at EOB, pt able to scoot in B lateral directions without assist as well as scoot towards EOB.  Transfers Overall transfer level: Needs assistance Equipment used: Rolling walker (2 wheeled) Transfers: Sit to/from Stand Sit to Stand: Min guard         General transfer comment: safe technique using RW. Increased SOB symptoms noted with exertion  Ambulation/Gait Ambulation/Gait assistance: Min guard Ambulation Distance (Feet): 10 Feet Assistive  device: Rolling walker (2 wheeled) Gait Pattern/deviations: Step-to pattern     General Gait Details: step to gait pattern with safe technique from bed->BSC->bed->recliner. Safe technique with all movement with no LOB noted. Safe technique with RW. Maintained on 3L of O2 with exertion with sats at 90%.  Stairs            Wheelchair Mobility    Modified Rankin (Stroke Patients Only)       Balance Overall balance assessment: Needs assistance (reports no falls) Sitting-balance support: Feet supported;Bilateral upper extremity supported Sitting balance-Leahy Scale: Normal     Standing balance support: Bilateral upper extremity supported Standing balance-Leahy Scale: Good                               Pertinent Vitals/Pain Pain Assessment: Faces Faces Pain Scale: Hurts little more Pain Location: B knees with movement Pain Descriptors / Indicators: Aching;Discomfort;Dull Pain Intervention(s): Limited activity within patient's tolerance;Repositioned    Home Living Family/patient expects to be discharged to:: Private residence Living Arrangements: Alone Available Help at Discharge: Family (daughter is close by) Type of Home: House Home Access: Stairs to enter Entrance Stairs-Rails: Can reach both Entrance Stairs-Number of Steps: 2 Home Layout: One level Home Equipment: Walker - 2 wheels;Walker - 4 wheels;Wheelchair - manual      Prior Function Level of Independence: Independent with assistive device(s)         Comments: usually able to ambulate short household distances with rollater and uses electric WC primarily around home and community. Daughter assist  for ADLs.     Hand Dominance        Extremity/Trunk Assessment   Upper Extremity Assessment Upper Extremity Assessment: Overall WFL for tasks assessed    Lower Extremity Assessment Lower Extremity Assessment: Generalized weakness (B LE grossly 4/5)       Communication   Communication:  No difficulties  Cognition Arousal/Alertness: Awake/alert Behavior During Therapy: WFL for tasks assessed/performed Overall Cognitive Status: Within Functional Limits for tasks assessed                                        General Comments      Exercises Other Exercises Other Exercises: seated ther-ex performed including LAQ, ankle circles, shoudler flexion, and hip abd/add. All ther-ex performed x 10 reps with supervision and cues for correct technique. 2 rest break required secondary to fatigue. Other Exercises: Assist for bathroom tasks while on BSC. Needs set up and cga while performing self hygiene.   Assessment/Plan    PT Assessment Patient needs continued PT services  PT Problem List Decreased strength;Decreased balance;Decreased mobility;Pain;Cardiopulmonary status limiting activity       PT Treatment Interventions Gait training;DME instruction;Therapeutic exercise    PT Goals (Current goals can be found in the Care Plan section)  Acute Rehab PT Goals Patient Stated Goal: to get stronger PT Goal Formulation: With patient Time For Goal Achievement: 04/13/17 Potential to Achieve Goals: Good    Frequency Min 2X/week   Barriers to discharge        Co-evaluation               AM-PAC PT "6 Clicks" Daily Activity  Outcome Measure Difficulty turning over in bed (including adjusting bedclothes, sheets and blankets)?: None Difficulty moving from lying on back to sitting on the side of the bed? : None Difficulty sitting down on and standing up from a chair with arms (e.g., wheelchair, bedside commode, etc,.)?: Total Help needed moving to and from a bed to chair (including a wheelchair)?: A Little Help needed walking in hospital room?: A Little Help needed climbing 3-5 steps with a railing? : A Lot 6 Click Score: 17    End of Session Equipment Utilized During Treatment: Gait belt;Oxygen Activity Tolerance: Patient limited by fatigue Patient  left: in chair;with nursing/sitter in room (no chair alarm on unit; RN aware) Nurse Communication: Mobility status PT Visit Diagnosis: Unsteadiness on feet (R26.81);Muscle weakness (generalized) (M62.81);Pain;Difficulty in walking, not elsewhere classified (R26.2)    Time: 7673-4193 PT Time Calculation (min) (ACUTE ONLY): 31 min   Charges:   PT Evaluation $PT Eval Low Complexity: 1 Procedure PT Treatments $Therapeutic Exercise: 8-22 mins $Therapeutic Activity: 8-22 mins   PT G Codes:        Greggory Stallion, PT, DPT [phone]   Kelly Dillon 03/30/2017, 9:57 AM

## 2017-03-31 LAB — GLUCOSE, CAPILLARY
GLUCOSE-CAPILLARY: 289 mg/dL — AB (ref 65–99)
Glucose-Capillary: 206 mg/dL — ABNORMAL HIGH (ref 65–99)
Glucose-Capillary: 245 mg/dL — ABNORMAL HIGH (ref 65–99)
Glucose-Capillary: 337 mg/dL — ABNORMAL HIGH (ref 65–99)

## 2017-03-31 LAB — HIV ANTIBODY (ROUTINE TESTING W REFLEX): HIV SCREEN 4TH GENERATION: NONREACTIVE

## 2017-03-31 NOTE — Progress Notes (Signed)
[facility] [place] NAME: Kelly Dillon    MR#:  [id number]  DATE OF BIRTH:  1961/02/01  SUBJECTIVE:   Patient here due to shortness of breath and wheezing and noted to be in COPD exacerbation. Shortness of breath improved since admission but still has some wheezing/bronchospasm.   REVIEW OF SYSTEMS:    Review of Systems  Constitutional: Negative for chills and fever.  HENT: Negative for congestion and tinnitus.   Eyes: Negative for blurred vision and double vision.  Respiratory: Positive for shortness of breath and wheezing. Negative for cough.   Cardiovascular: Negative for chest pain, orthopnea and PND.  Gastrointestinal: Negative for abdominal pain, diarrhea, nausea and vomiting.  Genitourinary: Negative for dysuria and hematuria.  Neurological: Negative for dizziness, sensory change and focal weakness.  All other systems reviewed and are negative.   Nutrition: Heart healthy Tolerating Diet: Yes Tolerating PT: Eval noted.    DRUG ALLERGIES:  No Known Allergies  VITALS:  Blood pressure 118/64, pulse 74, temperature 97.5 F (36.4 C), temperature source Oral, resp. rate (!) 24, height 4\' 10"  (1.473 m), weight 88.7 kg (195 lb 8 oz), SpO2 97 %.  PHYSICAL EXAMINATION:   Physical Exam  GENERAL:  56 y.o.-year-old patient lying in bed in no acute distress.  EYES: Pupils equal, round, reactive to light and accommodation. No scleral icterus. Extraocular muscles intact.  HEENT: Head atraumatic, normocephalic. Oropharynx and nasopharynx clear.  NECK:  Supple, no jugular venous distention. No thyroid enlargement, no tenderness.  LUNGS: end-exp. Wheezing, rhonchi bilaterally, negative use of accessory muscles. No dullness to percussion. CARDIOVASCULAR: S1, S2 normal. No murmurs, rubs, or gallops.  ABDOMEN: Soft, nontender, nondistended. Bowel sounds present. No organomegaly or mass.  EXTREMITIES: No cyanosis, clubbing or edema b/l.     NEUROLOGIC: Cranial nerves II through XII are intact. No focal Motor or sensory deficits b/l.   PSYCHIATRIC: The patient is alert and oriented x 3.  SKIN: No obvious rash, lesion, or ulcer.    LABORATORY PANEL:   CBC  Recent Labs Lab 03/30/17 0527  WBC 18.5*  HGB 14.4  HCT 42.4  PLT 185   ------------------------------------------------------------------------------------------------------------------  Chemistries   Recent Labs Lab 03/29/17 1145 03/30/17 0527  NA 138 140  K 3.7 3.5  CL 106 108  CO2 25 26  GLUCOSE 176* 166*  BUN 10 15  CREATININE 0.54 0.60  CALCIUM 9.2 9.1  AST 48*  --   ALT 31  --   ALKPHOS 102  --   BILITOT 1.0  --    ------------------------------------------------------------------------------------------------------------------  Cardiac Enzymes  Recent Labs Lab 03/29/17 1145  TROPONINI <0.03   ------------------------------------------------------------------------------------------------------------------  RADIOLOGY:  No results found.   ASSESSMENT AND PLAN:   56 year old female with past history of hypertension, diabetes, COPD who presented to the hospital due to shortness of breath and noted to be in COPD exacerbation.  1. COPD exacerbation-secondary to ongoing tobacco abuse and also underlying bronchitis/atypical pneumonia. -Continue IV steroids, scheduled DuoNeb's, Pulmicort nebs and improved since yesterday. -Continue IV ceftriaxone, Zithromax.  -seen by pulmonary and continue current care.   2. Chronic Pain - cont. MS contin, Morphine IR  3. DM - cont. SSI and follow BS  4. Depression - cont. Trazodone, Effexor  5. Neuropathy - cont. Lyrica  6. Urinary Incontinence - cont. Ditropan  Likely d/c home tomorrow.   All the records are reviewed and case discussed with Care Management/Social Worker. Management plans discussed with the patient, family  and they are in agreement.  CODE STATUS: Full Code  DVT  Prophylaxis: Lovenox  TOTAL TIME TAKING CARE OF THIS PATIENT: 25 minutes.   POSSIBLE D/C IN 1-2 DAYS, DEPENDING ON CLINICAL CONDITION.   Henreitta Leber M.D on 03/31/2017 at 2:03 PM  Between 7am to 6pm - Pager - [phone]  After 6pm go to [email or web address] - Proofreader  [facility]  Office  [phone]  CC: Primary care physician; System, Pcp Not In

## 2017-03-31 NOTE — Progress Notes (Signed)
Physical Therapy Treatment Patient Details Name: Kelly Dillon MRN: [id number] DOB: [birth year] Today's Date: 03/31/2017    History of Present Illness Pt admitted for acute exacerbation of COPD. Pt with complaints of anxiety and SOB symptoms x 1 week. History includes COPD, DM, and HTN and is currently on 2L of O2 at home.     PT Comments    Pt is making improved progress with mobility with increased ambulation distance noted this date. All mobility performed on 2L of O2 with sats WNL at rest and with exertion. Good endurance with there-ex this date. Will continue to progress.   Follow Up Recommendations  Home health PT     Equipment Recommendations  None recommended by PT    Recommendations for Other Services OT consult     Precautions / Restrictions Precautions Precautions: Fall Restrictions Weight Bearing Restrictions: No    Mobility  Bed Mobility Overal bed mobility: Modified Independent             General bed mobility comments: safe technique with ease of transition  Transfers Overall transfer level: Needs assistance Equipment used: Rolling walker (2 wheeled) Transfers: Sit to/from Stand Sit to Stand: Modified independent (Device/Increase time)         General transfer comment: safe technique with upright posture  Ambulation/Gait Ambulation/Gait assistance: Min guard Ambulation Distance (Feet): 60 Feet Assistive device: Rolling walker (2 wheeled) Gait Pattern/deviations: Step-through pattern     General Gait Details: ambulated using reciprocal gait pattern and safe technique around room. Pt on 2L of O2 with all exertion with O2 sats WNL. No fatigue noted. 1 coughing spell with exertion.   Stairs            Wheelchair Mobility    Modified Rankin (Stroke Patients Only)       Balance Overall balance assessment: Needs assistance Sitting-balance support: Feet supported;No upper extremity supported Sitting balance-Leahy Scale: Normal      Standing balance support: Bilateral upper extremity supported Standing balance-Leahy Scale: Good                              Cognition Arousal/Alertness: Awake/alert Behavior During Therapy: WFL for tasks assessed/performed Overall Cognitive Status: Within Functional Limits for tasks assessed                                        Exercises Other Exercises Other Exercises: supine ther-ex performed on B LE including ankle circles, SLRs, hip abd/add, SAQ, and hip add squeezes. ALl ther-ex performed x 12 reps with supervision. Other Exercises: Assist for ambulation to bathroom with safe technique. Able to perform self hygiene with supervision    General Comments        Pertinent Vitals/Pain Pain Assessment: No/denies pain    Home Living                      Prior Function            PT Goals (current goals can now be found in the care plan section) Acute Rehab PT Goals Patient Stated Goal: to get stronger PT Goal Formulation: With patient Time For Goal Achievement: 04/13/17 Potential to Achieve Goals: Good Progress towards PT goals: Progressing toward goals    Frequency    Min 2X/week      PT Plan Current plan remains appropriate  Co-evaluation              AM-PAC PT "6 Clicks" Daily Activity  Outcome Measure  Difficulty turning over in bed (including adjusting bedclothes, sheets and blankets)?: None Difficulty moving from lying on back to sitting on the side of the bed? : None Difficulty sitting down on and standing up from a chair with arms (e.g., wheelchair, bedside commode, etc,.)?: Total Help needed moving to and from a bed to chair (including a wheelchair)?: None Help needed walking in hospital room?: None Help needed climbing 3-5 steps with a railing? : A Little 6 Click Score: 20    End of Session Equipment Utilized During Treatment: Gait belt;Oxygen Activity Tolerance: Patient tolerated treatment  well Patient left: in chair;with chair alarm set Nurse Communication: Mobility status PT Visit Diagnosis: Unsteadiness on feet (R26.81);Muscle weakness (generalized) (M62.81);Pain;Difficulty in walking, not elsewhere classified (R26.2)     Time: 5625-6389 PT Time Calculation (min) (ACUTE ONLY): 24 min  Charges:  $Gait Training: 8-22 mins $Therapeutic Exercise: 8-22 mins                    G Codes:       Greggory Stallion, PT, DPT [phone]    Dorothea Yow 03/31/2017, 1:16 PM

## 2017-03-31 NOTE — Plan of Care (Signed)
Problem: Activity: Goal: Ability to tolerate increased activity will improve Outcome: Progressing Patient ambulating without being diaphoretic, patient states she feels less short of breath than yesterday. Patient now on 2LO2 as she is on baseline, sating 95%.   Deri Fuelling, RN

## 2017-03-31 NOTE — Progress Notes (Signed)
Date: 03/31/2017,   MRN# [id number] Kelly Dillon 05-03-1961 Code Status:     Code Status Orders        Start     Ordered   03/29/17 1654  Full code  Continuous     03/29/17 1653    Code Status History    Date Active Date Inactive Code Status Order ID Comments User Context   This patient has a current code status but no historical code status.     Hosp day:@LENGTHOFSTAYDAYS @ Referring MD: @ATDPROV @        HPI: Sitting in the chair eating. Speaking in full sentences. Still wheezing, cough and sob is some better.   PMHX:   Past Medical History:  Diagnosis Date  . COPD (chronic obstructive pulmonary disease) ([place])   . Diabetes mellitus without complication ([place])   . Hypertension    Surgical Hx:  Past Surgical History:  Procedure Laterality Date  . APPENDECTOMY    . HERNIA REPAIR    . TUBAL LIGATION     Family Hx:  History reviewed. No pertinent family history. Social Hx:   Social History  Substance Use Topics  . Smoking status: Former Smoker    Types: Cigarettes    Quit date: 02/19/2017  . Smokeless tobacco: Never Used  . Alcohol use No   Medication:    Home Medication:    Current Medication: @CURMEDTAB @   Allergies:  Patient has no known allergies.  Review of Systems: Gen:  Denies  fever, sweats, chills HEENT: Denies blurred vision, double vision, ear pain, eye pain, hearing loss, nose bleeds, sore throat Cvc:  No dizziness, chest pain or heaviness Resp:   Still wheezing, less dyspneic Gi: Denies swallowing difficulty, stomach pain, nausea or vomiting, diarrhea, constipation, bowel incontinence Gu:  Denies bladder incontinence, burning urine Ext:   No Joint pain, stiffness or swelling Skin: No skin rash, easy bruising or bleeding or hives Endoc:  No polyuria, polydipsia , polyphagia or weight change Psych: No depression, insomnia or hallucinations  Other:  All other systems negative  Physical Examination:   VS: BP 118/64 (BP Location: Left Arm)   Pulse  74   Temp 97.5 F (36.4 C) (Oral)   Resp (!) 24   Ht 4\' 10"  (1.473 m)   Wt 195 lb 8 oz (88.7 kg)   SpO2 97%   BMI 40.86 kg/m   General Appearance: No distress [place] o2 on at  Neuro: without focal findings, mental status, speech normal, alert and oriented, cranial nerves 2-12 intact, reflexes normal and symmetric, sensation grossly normal  NECK: No stridor HEENT: PERRLA, EOM intact, no ptosis, no other lesions noticed,  Pulmonary:.still wheezing, tight No rales  :   Cardiovascular:  Normal S1,S2.  No m/r/g.    Abdomen:Benign, Soft, non-tender, No masses, hepatosplenomegaly, No lymphadenopathy Endoc: No evident thyromegaly, no signs of acromegaly or Cushing features Skin:   warm, no rashes, no ecchymosis  Extremities: normal, no cyanosis, clubbing, no edema, warm with normal capillary refill. Other findings:   Labs results:   Recent Labs     03/28/17  2135  03/29/17  1145  03/30/17  0527  HGB  12.8  14.9  14.4  HCT  37.3  43.9  42.4  MCV  88.4  90.1  91.0  WBC  12.4*  15.0*  18.5*  BUN  7  10  15   CREATININE  0.65  0.54  0.60  GLUCOSE  132*  176*  166*  CALCIUM  8.5*  9.2  9.1  ,  IGE pending    Assessment and Plan: Known hx of copd/asthma, on oxygen, smoker here with an exacerbation, still wheezing -duo nebs qid -budenoside 0.5 bid -solumederol 50 mg qid , transition to prednisone -[facility] up at [facility] pulmonary -out patient pfts -reases in am dvt prophylaxis Keep another day   Peripheral infiltrates. ? Etiology. Ddx: cap  on rocephin, ? EOS pneumonia. Doubt ( no fever, no peripheral eosinophilia, grant it she was on steroids prior to admission) -treat as cap  -out patient follow up chest xray -anticipate going home on pred taper -hold on bronch unless cxr not improving         I have personally obtained a history, examined the patient, evaluated laboratory and imaging results, formulated the assessment and plan and placed orders.  The Patient requires high  complexity decision making for assessment and support, frequent evaluation and titration of therapies, application of advanced monitoring technologies and extensive interpretation of multiple databases.   Abdulrahim Siddiqi,M.D. Pulmonary & Critical care Medicine [facility]

## 2017-03-31 NOTE — Progress Notes (Signed)
Inpatient Diabetes Program Recommendations  AACE/ADA: New Consensus Statement on Inpatient Glycemic Control (2015)  Target Ranges:  Prepandial:   less than 140 mg/dL      Peak postprandial:   less than 180 mg/dL (1-2 hours)      Critically ill patients:  140 - 180 mg/dL   Results for Kelly Dillon, Kelly Dillon (MRN [id number]) as of 03/31/2017 09:23  Ref. Range 03/30/2017 07:24 03/30/2017 11:26 03/30/2017 16:15 03/30/2017 21:23 03/31/2017 07:48  Glucose-Capillary Latest Ref Range: 65 - 99 mg/dL 174 (H) 203 (H) 254 (H) 227 (H) 206 (H)   Review of Glycemic Control  Diabetes history: DM2 Outpatient Diabetes medications: Metformin 1000 mg BID Current orders for Inpatient glycemic control: Novolog 0-9 units TID with meals for meal coverage  Inpatient Diabetes Program Recommendations: Correction (SSI): Please consider ordering Novolog 0-5 units QHS for bedtime correction. Insulin - Meal Coverage: While inpatient and ordered steroids, please consider ordering Novolog 3 units TID with meals for meal coverage if patient eats at least 50% of meals.  Thanks, Barnie Alderman, RN, MSN, CDE Diabetes Coordinator Inpatient Diabetes Program [phone] (Team Pager from 8am to 5pm)

## 2017-03-31 NOTE — Care Management Important Message (Signed)
Important Message  Patient Details  Name: Kelly Dillon MRN: [id number] Date of Birth: Aug 14, 1961   Medicare Important Message Given:  Yes    Jolly Mango, RN 03/31/2017, 9:49 AM

## 2017-04-01 LAB — GLUCOSE, CAPILLARY
Glucose-Capillary: 198 mg/dL — ABNORMAL HIGH (ref 65–99)
Glucose-Capillary: 275 mg/dL — ABNORMAL HIGH (ref 65–99)

## 2017-04-01 LAB — PROCALCITONIN

## 2017-04-01 MED ORDER — IPRATROPIUM-ALBUTEROL 0.5-2.5 (3) MG/3ML IN SOLN
3.0000 mL | Freq: Four times a day (QID) | RESPIRATORY_TRACT | Status: DC
  Administered 2017-04-01: 3 mL via RESPIRATORY_TRACT
  Filled 2017-04-01: qty 3

## 2017-04-01 MED ORDER — GUAIFENESIN ER 600 MG PO TB12: 600.0000 mg | ORAL_TABLET | Freq: Two times a day (BID) | ORAL | 0 refills | Status: DC

## 2017-04-01 MED ORDER — LEVOFLOXACIN 500 MG PO TABS: 500.0000 mg | ORAL_TABLET | Freq: Every day | ORAL | 0 refills | Status: DC

## 2017-04-01 MED ORDER — IPRATROPIUM-ALBUTEROL 0.5-2.5 (3) MG/3ML IN SOLN: 3.0000 mL | Freq: Four times a day (QID) | RESPIRATORY_TRACT | 0 refills | Status: DC

## 2017-04-01 MED ORDER — GUAIFENESIN-CODEINE 100-10 MG/5ML PO SOLN: 10.0000 mL | Freq: Four times a day (QID) | ORAL | 0 refills | Status: DC | PRN

## 2017-04-01 MED ORDER — INSULIN LISPRO 100 UNIT/ML (KWIKPEN): 15.0000 [IU] | PEN_INJECTOR | Freq: Every day | SUBCUTANEOUS | 0 refills | Status: DC

## 2017-04-01 MED ORDER — INSULIN GLARGINE 100 UNITS/ML SOLOSTAR PEN: 15.0000 [IU] | PEN_INJECTOR | Freq: Every day | SUBCUTANEOUS | 0 refills | Status: DC

## 2017-04-01 MED ORDER — PREDNISONE 50 MG PO TABS: 50.0000 mg | ORAL_TABLET | Freq: Every day | ORAL | 0 refills | Status: DC

## 2017-04-01 NOTE — Care Management Note (Signed)
Case Management Note  Patient Details  Name: Tassie Pollett MRN: [id number] Date of Birth: 12-Jan-1961  Subjective/Objective:       Mandy at [place] was updated that Mrs Enerson address that she will be going to after discharge is [address] [place], [place].              Action/Plan:   Expected Discharge Date:  04/01/17               Expected Discharge Plan:  [place]  In-House Referral:     Discharge planning Services  CM Consult  Post Acute Care Choice:  Home Health Choice offered to:  Patient  DME Arranged:    DME Agency:     HH Arranged:  RN, PT, Nurse's Aide [place] Agency:  [place]  Status of Service:  In process, will continue to follow  If discussed at Long Length of Stay Meetings, dates discussed:    Additional Comments:  Koby [place]field A, RN 04/01/2017, 1:55 PM

## 2017-04-01 NOTE — Discharge Instructions (Signed)
Resume diet and activity as before  QUIT SMOKING

## 2017-04-01 NOTE — Care Management Note (Signed)
Case Management Note  Patient Details  Name: Kelly Dillon MRN: [id number] Date of Birth: 1961/05/30  Subjective/Objective:      [name] was accepted by [facility] for new home oxygen and delivery of a nebulizer machine. Ardeen Fillers at [facility] accepted [name] for HH-PT and RN after running her insurance. [name]'s generic Medicare is through [employer]. [facility] and DME refused referral for new home oxygen and for HH-PT and RN is why these referrals were given to other providers after initially being referred to [facility] .            Action/Plan:   Expected Discharge Date:  04/01/17               Expected Discharge Plan:  [name]  In-House Referral:     Discharge planning Services  CM Consult  Post Acute Care Choice:  Home Health Choice offered to:  Patient  DME Arranged:    DME Agency:     HH Arranged:  RN, PT, Nurse's Aide [place] Agency:  [place]  Status of Service:  In process, will continue to follow  If discussed at Long Length of Stay Meetings, dates discussed:    Additional Comments:  Gabreil [place] A, RN 04/01/2017, 3:11 PM

## 2017-04-01 NOTE — Care Management Note (Addendum)
Case Management Note  Patient Details  Name: Kelly Dillon MRN: [id number] Date of Birth: Feb 27, 1961  Subjective/Objective:     Discussed discharge planning with [name]. Call to [facility] at [place] requesting a nebulizer machine and  new home oxygen with a portable tank to be delivered to [name] in room. Qualifying Sats are in Progress Notes and qualifying diagnosis is COPD. Referral called to Ardeen Fillers at [facility] for HH-PT, RN. No other discharge needs were identified. Dona at [place] called to request [name]'s Medicare ID number. We are currently awaiting a call back from [name] who has her insurance cards.              149.  Action/Plan:   Expected Discharge Date:  04/01/17               Expected Discharge Plan:  [place]  In-House Referral:     Discharge planning Services  CM Consult  Post Acute Care Choice:  Home Health Choice offered to:  Patient  DME Arranged:    DME Agency:     HH Arranged:  RN, PT, Nurse's Aide [place] Agency:  [place]  Status of Service:  In process, will continue to follow  If discussed at Long Length of Stay Meetings, dates discussed:    Additional Comments:  Kijuana Ruppel A, RN 04/01/2017, 11:17 AM

## 2017-04-01 NOTE — Progress Notes (Signed)
Patient was discharged home with daughter. Portable O2 and ned machine sent with patient. IV removed with cath intact. Reviewed, meds, last dose given, and scripts. Allowed time for questions.

## 2017-04-01 NOTE — Progress Notes (Signed)
SATURATION QUALIFICATIONS: (This note is used to comply with regulatory documentation for home oxygen)  Patient Saturations on Room Air at Rest = 88%  Patient Saturations on Room Air while Ambulating = 84%  Patient Saturations on 2 Liters of oxygen while Ambulating = 94%  Please briefly explain why patient needs home oxygen: Sats 97-98 on 2L at rest. Desats with activity.

## 2017-04-03 LAB — CULTURE, BLOOD (ROUTINE X 2)
CULTURE: NO GROWTH
CULTURE: NO GROWTH
SPECIAL REQUESTS: ADEQUATE
Special Requests: ADEQUATE

## 2017-04-04 LAB — IGE: IgE (Immunoglobulin E), Serum: 250 IU/mL — ABNORMAL HIGH (ref 0–100)

## 2017-04-10 NOTE — Discharge Summary (Signed)
[facility] at [place] NAME: Kelly Dillon    MR#:  [id number]  DATE OF BIRTH:  Dec 08, 1960  DATE OF ADMISSION:  03/29/2017 ADMITTING PHYSICIAN: Dustin Flock, MD  DATE OF DISCHARGE: 04/01/2017  4:48 PM  PRIMARY CARE PHYSICIAN: System, Pcp Not In   ADMISSION DIAGNOSIS:  Dyspnea [R06.00]  DISCHARGE DIAGNOSIS:  Active Problems:   Acute exacerbation of COPD with asthma ([place])   SECONDARY DIAGNOSIS:   Past Medical History:  Diagnosis Date  . COPD (chronic obstructive pulmonary disease) ([place])   . Diabetes mellitus without complication ([place])   . Hypertension      ADMITTING HISTORY  HISTORY OF PRESENT ILLNESS: Kelly Dillon  is a 56 y.o. female with a known history of  COPD, diabetes type 2, essential hypertension who is presenting to the emergency room with complaint of shortness of breath ongoing for the past 1 week. She reports that she has had productive cough with greenish yellow sputum. Also progressive shortness of breath with wheezing. She came to the emergency room yesterday and was given some prednisone however she was not able to fill that. She comes back to the emergency room with complaint of worsening shortness of breath and cough. She is noted to have oxygen saturations in the 80s. She underwent a CT scan of the chest which shows bilateral infiltrates suggestive of possible atypical infection. Patient does complain of having some fevers and chills. Complains of chest pain with coughing. No denies any nausea vomiting or diarrhea.  HOSPITAL COURSE:   * Acute COPD exacerbation and atypical pneumonia Acute on chronic respiratory failure . Patient was treated with IV steroids, antibiotics and scheduled nebulizer treatment. With this she improved well. By the day of discharge patient has mild expiratory wheezing.  She feels back to normal with some breathing. Discharged home on prednisone and oral antibiotics to follow up with her primary care  physician.  Patient's home medications for chronic pain, depression and neuropathy were continued.  Stable for discharge home.  CONSULTS OBTAINED:  Treatment Team:  Erby Pian, MD  DRUG ALLERGIES:  No Known Allergies  DISCHARGE MEDICATIONS:   Discharge Medication List as of 04/01/2017 10:07 AM    START taking these medications   Details  guaiFENesin (MUCINEX) 600 MG 12 hr tablet Take 1 tablet (600 mg total) by mouth 2 (two) times daily., Starting Sat 04/01/2017, Normal    guaiFENesin-codeine 100-10 MG/5ML syrup Take 10 mLs by mouth every 6 (six) hours as needed for cough., Starting Sat 04/01/2017, Print    insulin glargine (LANTUS) 100 unit/mL SOPN Inject 0.15 mLs (15 Units total) into the skin at bedtime., Starting Sat 04/01/2017, Print    insulin lispro (HUMALOG KWIKPEN) 100 UNIT/ML KiwkPen Inject 0.15 mLs (15 Units total) into the skin daily with breakfast., Starting Sat 04/01/2017, Normal    ipratropium-albuterol (DUONEB) 0.5-2.5 (3) MG/3ML SOLN Take 3 mLs by nebulization QID., Starting Sat 04/01/2017, Normal    levofloxacin (LEVAQUIN) 500 MG tablet Take 1 tablet (500 mg total) by mouth daily., Starting Sat 04/01/2017, Normal      CONTINUE these medications which have CHANGED   Details  predniSONE (DELTASONE) 50 MG tablet Take 1 tablet (50 mg total) by mouth daily with breakfast., Starting Sat 04/01/2017, Normal      CONTINUE these medications which have NOT CHANGED   Details  ADVAIR DISKUS 250-50 MCG/DOSE AEPB Inhale 1 puff into the lungs 2 (two) times daily., Starting Thu 01/19/2017, Historical Med  albuterol (PROVENTIL HFA;VENTOLIN HFA) 108 (90 Base) MCG/ACT inhaler Inhale 2 puffs into the lungs every 6 (six) hours as needed for wheezing or shortness of breath., Starting Tue 03/28/2017, Print    LYRICA 200 MG capsule Take 1 capsule by mouth 3 (three) times daily as needed., Starting Wed 02/22/2017, Historical Med    metFORMIN (GLUCOPHAGE) 1000 MG tablet Take 1 tablet by  mouth 2 (two) times daily., Starting Thu 01/12/2017, Historical Med    methocarbamol (ROBAXIN) 500 MG tablet Take 1 tablet by mouth 3 (three) times daily as needed., Starting Mon 02/27/2017, Historical Med    morphine (MS CONTIN) 30 MG 12 hr tablet Take 1 tablet by mouth every 12 (twelve) hours., Starting Mon 02/13/2017, Until Fri 04/14/2017, Historical Med    morphine (MSIR) 15 MG tablet Take 1 tablet by mouth 2 (two) times daily., Starting Mon 02/13/2017, Until Fri 04/14/2017, Historical Med    omeprazole (PRILOSEC) 40 MG capsule Take 1 capsule by mouth daily., Starting Sat 02/18/2017, Historical Med    oxybutynin (DITROPAN) 5 MG tablet Take 1 tablet by mouth 2 (two) times daily., Starting Mon 02/13/2017, Until Tue 02/13/2018, Historical Med    traZODone (DESYREL) 50 MG tablet Take 1 tablet by mouth daily., Starting Tue 07/12/2016, Historical Med    venlafaxine XR (EFFEXOR-XR) 150 MG 24 hr capsule Take 1 capsule by mouth daily., Starting Sun 03/19/2017, Historical Med    zolpidem (AMBIEN) 5 MG tablet Take 1 tablet by mouth at bedtime., Starting Sun 03/19/2017, Historical Med    Spacer/Aero Chamber Mouthpiece MISC 1 Units by Does not apply route every 4 (four) hours as needed (wheezing)., Starting Tue 03/28/2017, Print      STOP taking these medications     ipratropium (ATROVENT HFA) 17 MCG/ACT inhaler         Today   VITAL SIGNS:  Blood pressure (!) 145/85, pulse 78, temperature 97.9 F (36.6 C), temperature source Oral, resp. rate 18, height 4\' 10"  (1.473 m), weight 88.7 kg (195 lb 8 oz), SpO2 97 %.  I/O:  No intake or output data in the 24 hours ending 04/10/17 1346  PHYSICAL EXAMINATION:  Physical Exam  GENERAL:  56 y.o.-year-old patient lying in the bed with no acute distress. Morbidly obese LUNGS: Normal work of breathing. Mild expiratory wheezing. CARDIOVASCULAR: S1, S2 normal. No murmurs, rubs, or gallops.  ABDOMEN: Soft, non-tender, non-distended. Bowel sounds present. No  organomegaly or mass.  NEUROLOGIC: Moves all 4 extremities. PSYCHIATRIC: The patient is alert and oriented x 3.  SKIN: No obvious rash, lesion, or ulcer.   DATA REVIEW:   CBC No results for input(s): WBC, HGB, HCT, PLT in the last 168 hours.  Chemistries  No results for input(s): NA, K, CL, CO2, GLUCOSE, BUN, CREATININE, CALCIUM, MG, AST, ALT, ALKPHOS, BILITOT in the last 168 hours.  Invalid input(s): GFRCGP  Cardiac Enzymes No results for input(s): TROPONINI in the last 168 hours.  Microbiology Results  Results for orders placed or performed during the hospital encounter of 03/29/17  Blood culture (routine x 2)     Status: None   Collection Time: 03/29/17  2:42 PM  Result Value Ref Range Status   Specimen Description BLOOD RIGHT ANTECUBITAL  Final   Special Requests   Final    BOTTLES DRAWN AEROBIC AND ANAEROBIC Blood Culture adequate volume   Culture NO GROWTH 5 DAYS  Final   Report Status 04/03/2017 FINAL  Final  Blood culture (routine x 2)     Status: None  Collection Time: 03/29/17  2:42 PM  Result Value Ref Range Status   Specimen Description BLOOD RIGHT HAND  Final   Special Requests   Final    BOTTLES DRAWN AEROBIC AND ANAEROBIC Blood Culture adequate volume   Culture NO GROWTH 5 DAYS  Final   Report Status 04/03/2017 FINAL  Final    RADIOLOGY:  No results found.  Follow up with PCP in 1 week.  Management plans discussed with the patient, family and they are in agreement.  CODE STATUS:  Code Status History    Date Active Date Inactive Code Status Order ID Comments User Context   03/29/2017  4:53 PM 04/01/2017  7:54 PM Full Code [id number]  Dustin Flock, MD Inpatient      TOTAL TIME TAKING CARE OF THIS PATIENT ON DAY OF DISCHARGE: more than 30 minutes.   Hillary Bow R M.D on 04/10/2017 at 1:46 PM  Between 7am to 6pm - Pager - [phone]  After 6pm go to [email or web address] - password [email or web address] [place] [facility]  Office   [phone]  CC: Primary care physician; System, Pcp Not In  Note: This dictation was prepared with Dragon dictation along with smaller phrase technology. Any transcriptional errors that result from this process are unintentional.

## 2017-05-06 ENCOUNTER — Emergency Department
Admission: EM | Admit: 2017-05-06 | Discharge: 2017-05-07 | Disposition: A | Discharge status: Home / Self Care | Payer: Medicare (Managed Care) | Attending: Emergency Medicine | Admitting: Emergency Medicine

## 2017-05-06 ENCOUNTER — Encounter: Payer: Self-pay | Admitting: Emergency Medicine

## 2017-05-06 DIAGNOSIS — Z7951 Long term (current) use of inhaled steroids: Secondary | ICD-10-CM | POA: Insufficient documentation

## 2017-05-06 DIAGNOSIS — Z7984 Long term (current) use of oral hypoglycemic drugs: Secondary | ICD-10-CM | POA: Diagnosis not present

## 2017-05-06 DIAGNOSIS — J449 Chronic obstructive pulmonary disease, unspecified: Secondary | ICD-10-CM | POA: Diagnosis not present

## 2017-05-06 DIAGNOSIS — Z794 Long term (current) use of insulin: Secondary | ICD-10-CM | POA: Insufficient documentation

## 2017-05-06 DIAGNOSIS — R112 Nausea with vomiting, unspecified: Secondary | ICD-10-CM | POA: Insufficient documentation

## 2017-05-06 DIAGNOSIS — R42 Dizziness and giddiness: Secondary | ICD-10-CM | POA: Insufficient documentation

## 2017-05-06 DIAGNOSIS — R1084 Generalized abdominal pain: Secondary | ICD-10-CM | POA: Diagnosis not present

## 2017-05-06 DIAGNOSIS — Z79899 Other long term (current) drug therapy: Secondary | ICD-10-CM | POA: Diagnosis not present

## 2017-05-06 DIAGNOSIS — Z87891 Personal history of nicotine dependence: Secondary | ICD-10-CM | POA: Diagnosis not present

## 2017-05-06 DIAGNOSIS — E119 Type 2 diabetes mellitus without complications: Secondary | ICD-10-CM | POA: Insufficient documentation

## 2017-05-06 DIAGNOSIS — I10 Essential (primary) hypertension: Secondary | ICD-10-CM | POA: Insufficient documentation

## 2017-05-06 LAB — COMPREHENSIVE METABOLIC PANEL
ALK PHOS: 116 U/L (ref 38–126)
ALT: 34 U/L (ref 14–54)
ANION GAP: 8 (ref 5–15)
AST: 50 U/L — ABNORMAL HIGH (ref 15–41)
Albumin: 4.2 g/dL (ref 3.5–5.0)
BILIRUBIN TOTAL: 0.7 mg/dL (ref 0.3–1.2)
BUN: 15 mg/dL (ref 6–20)
CALCIUM: 9.3 mg/dL (ref 8.9–10.3)
CO2: 26 mmol/L (ref 22–32)
CREATININE: 0.83 mg/dL (ref 0.44–1.00)
Chloride: 103 mmol/L (ref 101–111)
Glucose, Bld: 111 mg/dL — ABNORMAL HIGH (ref 65–99)
Potassium: 3.8 mmol/L (ref 3.5–5.1)
SODIUM: 137 mmol/L (ref 135–145)
Total Protein: 10 g/dL — ABNORMAL HIGH (ref 6.5–8.1)

## 2017-05-06 LAB — URINALYSIS, COMPLETE (UACMP) WITH MICROSCOPIC
Bilirubin Urine: NEGATIVE
GLUCOSE, UA: NEGATIVE mg/dL
KETONES UR: NEGATIVE mg/dL
NITRITE: NEGATIVE
PH: 5 (ref 5.0–8.0)
PROTEIN: NEGATIVE mg/dL
Specific Gravity, Urine: 1.021 (ref 1.005–1.030)

## 2017-05-06 LAB — LIPASE, BLOOD: Lipase: 55 U/L — ABNORMAL HIGH (ref 11–51)

## 2017-05-06 LAB — TROPONIN I: Troponin I: <0.03 ng/mL (ref <0.03)

## 2017-05-06 LAB — CBC
HCT: 46.4 % (ref 35.0–47.0)
HEMOGLOBIN: 15.8 g/dL (ref 12.0–16.0)
MCH: 30.2 pg (ref 26.0–34.0)
MCHC: 34.1 g/dL (ref 32.0–36.0)
MCV: 88.6 fL (ref 80.0–100.0)
PLATELETS: 207 10*3/uL (ref 150–440)
RBC: 5.24 MIL/uL — AB (ref 3.80–5.20)
RDW: 14.4 % (ref 11.5–14.5)
WBC: 11.1 10*3/uL — ABNORMAL HIGH (ref 3.6–11.0)

## 2017-05-06 MED ORDER — IOPAMIDOL (ISOVUE-300) INJECTION 61%
30.0000 mL | Freq: Once | INTRAVENOUS | Status: AC | PRN
  Administered 2017-05-06: 30 mL via ORAL

## 2017-05-06 MED ORDER — FENTANYL CITRATE (PF) 100 MCG/2ML IJ SOLN
50.0000 ug | INTRAMUSCULAR | Status: DC | PRN
  Administered 2017-05-06: 50 ug via INTRAVENOUS
  Filled 2017-05-06: qty 2

## 2017-05-06 MED ORDER — SODIUM CHLORIDE 0.9 % IV BOLUS (SEPSIS)
1000.0000 mL | Freq: Once | INTRAVENOUS | Status: AC
  Administered 2017-05-06: 1000 mL via INTRAVENOUS

## 2017-05-06 MED ORDER — ONDANSETRON HCL 4 MG/2ML IJ SOLN
4.0000 mg | Freq: Once | INTRAMUSCULAR | Status: AC
  Administered 2017-05-06: 4 mg via INTRAVENOUS
  Filled 2017-05-06: qty 2

## 2017-05-06 MED ORDER — MORPHINE SULFATE (PF) 4 MG/ML IV SOLN
4.0000 mg | Freq: Once | INTRAVENOUS | Status: AC
  Administered 2017-05-06: 4 mg via INTRAVENOUS
  Filled 2017-05-06: qty 1

## 2017-05-06 MED ORDER — ONDANSETRON HCL 4 MG/2ML IJ SOLN
4.0000 mg | Freq: Once | INTRAMUSCULAR | Status: AC | PRN
  Administered 2017-05-06: 4 mg via INTRAVENOUS
  Filled 2017-05-06: qty 2

## 2017-05-06 NOTE — ED Notes (Signed)
Pt resting in recliner in subwait, waiting for treatment room; pt very talkative with other patients in same area; talking in complete coherent sentences

## 2017-05-06 NOTE — ED Notes (Signed)
Dr. Corky Downs notified that patient vomiting.

## 2017-05-06 NOTE — ED Triage Notes (Signed)
Pt states that she has been vomiting since Tuesday. Pt states that she ate some food Tuesday which smelled off but she is the only one sick. Pt denies diarrhea at this time. Pt also reports that she has had several near syncopal episodes in the last few days. Pt is diaphoretic at this time in triage.

## 2017-05-06 NOTE — ED Provider Notes (Signed)
[facility] Emergency Department Provider Note   ____________________________________________   First MD Initiated Contact with Patient 05/06/17 2329     (approximate)  I have reviewed the triage vital signs and the nursing notes.   HISTORY  Chief Complaint Emesis and Near Syncope    HPI Kelly Dillon is a 56 y.o. female who presents to the ED from home with a chief complaint of abdominal pain, nausea and vomiting. Symptoms 5 days. Patient thought she ate some bad food but she is the only family member who has symptoms. Denies associated constipation or diarrhea; last bowel movement today. Reports several near syncopal episodes in the past few days secondary to generalized weakness and lightheadedness. Denies associated fever, chills, chest pain, shortness of breath, dysuria. Denies recent travel, camping, trauma or antibiotic use. History of diverticulitis status post surgical repair.   Past Medical History:  Diagnosis Date  . COPD (chronic obstructive pulmonary disease) ([place])   . Diabetes mellitus without complication ([place])   . Hypertension     Patient Active Problem List   Diagnosis Date Noted  . Acute exacerbation of COPD with asthma ([place]) 03/29/2017    Past Surgical History:  Procedure Laterality Date  . APPENDECTOMY    . HERNIA REPAIR    . TUBAL LIGATION      Prior to Admission medications   Medication Sig Start Date End Date Taking? Authorizing Provider  ADVAIR DISKUS 250-50 MCG/DOSE AEPB Inhale 1 puff into the lungs 2 (two) times daily. 01/19/17   [provider]  albuterol (PROVENTIL HFA;VENTOLIN HFA) 108 (90 Base) MCG/ACT inhaler Inhale 2 puffs into the lungs every 6 (six) hours as needed for wheezing or shortness of breath. 03/28/17   Darel Hong, MD  dicyclomine (BENTYL) 20 MG tablet Take 1 tablet (20 mg total) by mouth every 6 (six) hours as needed. 05/07/17   Paulette Blanch, MD  guaiFENesin (MUCINEX) 600 MG 12 hr tablet Take 1  tablet (600 mg total) by mouth 2 (two) times daily. 04/01/17   Hillary Bow, MD  guaiFENesin-codeine 100-10 MG/5ML syrup Take 10 mLs by mouth every 6 (six) hours as needed for cough. 04/01/17   Hillary Bow, MD  insulin glargine (LANTUS) 100 unit/mL SOPN Inject 0.15 mLs (15 Units total) into the skin at bedtime. 04/01/17   Hillary Bow, MD  insulin lispro (HUMALOG KWIKPEN) 100 UNIT/ML KiwkPen Inject 0.15 mLs (15 Units total) into the skin daily with breakfast. 04/01/17   Hillary Bow, MD  ipratropium-albuterol (DUONEB) 0.5-2.5 (3) MG/3ML SOLN Take 3 mLs by nebulization QID. 04/01/17   Hillary Bow, MD  levofloxacin (LEVAQUIN) 500 MG tablet Take 1 tablet (500 mg total) by mouth daily. 04/01/17   Sudini, Srikar, MD  LYRICA 200 MG capsule Take 1 capsule by mouth 3 (three) times daily as needed. 02/22/17   [provider]  metFORMIN (GLUCOPHAGE) 1000 MG tablet Take 1 tablet by mouth 2 (two) times daily. 01/12/17   [provider]  methocarbamol (ROBAXIN) 500 MG tablet Take 1 tablet by mouth 3 (three) times daily as needed. 02/27/17   [provider]  omeprazole (PRILOSEC) 40 MG capsule Take 1 capsule by mouth daily. 02/18/17   [provider]  ondansetron (ZOFRAN ODT) 4 MG disintegrating tablet Take 1 tablet (4 mg total) by mouth every 8 (eight) hours as needed for nausea or vomiting. 05/07/17   Paulette Blanch, MD  oxybutynin (DITROPAN) 5 MG tablet Take 1 tablet by mouth 2 (two) times daily. 02/13/17  02/13/18  [provider]  predniSONE (DELTASONE) 50 MG tablet Take 1 tablet (50 mg total) by mouth daily with breakfast. 04/01/17   Hillary Bow, MD  promethazine (PHENERGAN) 25 MG tablet Take 1 tablet (25 mg total) by mouth every 6 (six) hours as needed for nausea or vomiting. 05/07/17   Paulette Blanch, MD  Spacer/Aero Chamber Mouthpiece MISC 1 Units by Does not apply route every 4 (four) hours as needed (wheezing). 03/28/17   Darel Hong, MD  traZODone (DESYREL) 50 MG  tablet Take 1 tablet by mouth daily. 07/12/16   [provider]  venlafaxine XR (EFFEXOR-XR) 150 MG 24 hr capsule Take 1 capsule by mouth daily. 03/19/17   [provider]  zolpidem (AMBIEN) 5 MG tablet Take 1 tablet by mouth at bedtime. 03/19/17   [provider]    Allergies Patient has no known allergies.  No family history on file.  Social History Social History  Substance Use Topics  . Smoking status: Former Smoker    Types: Cigarettes    Quit date: 02/19/2017  . Smokeless tobacco: Never Used  . Alcohol use No    Review of Systems  Constitutional: No fever/chills. Eyes: No visual changes. ENT: No sore throat. Cardiovascular: Denies chest pain. Respiratory: Denies shortness of breath. Gastrointestinal: Positive for abdominal pain, nausea, and vomiting.  No diarrhea.  No constipation. Genitourinary: Negative for dysuria. Musculoskeletal: Negative for back pain. Skin: Negative for rash. Neurological: Negative for headaches, focal weakness or numbness.   ____________________________________________   PHYSICAL EXAM:  VITAL SIGNS: ED Triage Vitals  Enc Vitals Group     BP 05/06/17 1940 131/68     Pulse Rate 05/06/17 1940 (!) 107     Resp 05/06/17 1940 (!) 22     Temp 05/06/17 1940 98.4 F (36.9 C)     Temp Source 05/06/17 1940 Oral     SpO2 05/06/17 1940 98 %     Weight 05/06/17 1942 195 lb (88.5 kg)     Height 05/06/17 1942 4\' 11"  (1.499 m)     Head Circumference --      Peak Flow --      Pain Score 05/06/17 1940 10     Pain Loc --      Pain Edu? --      Excl. in [place]? --     Constitutional: Alert and oriented. Well appearing and in mild acute distress. Eyes: Conjunctivae are normal.  Head: Atraumatic. Nose: No congestion/rhinnorhea. Mouth/Throat: Mucous membranes are mildly dry.  Oropharynx non-erythematous. Neck: No stridor.   Cardiovascular: Normal rate, regular rhythm. Grossly normal heart sounds.  Good peripheral  circulation. Respiratory: Normal respiratory effort.  No retractions. Lungs CTAB. Gastrointestinal: Soft with mild generalized tenderness to palpation without rebound or guarding. No distention. No abdominal bruits. No CVA tenderness. Musculoskeletal: No lower extremity tenderness nor edema.  No joint effusions. Neurologic:  Normal speech and language. No gross focal neurologic deficits are appreciated.  Skin:  Skin is warm, dry and intact. No rash noted. Psychiatric: Mood and affect are normal. Speech and behavior are normal.  ____________________________________________   LABS (all labs ordered are listed, but only abnormal results are displayed)  Labs Reviewed  LIPASE, BLOOD - Abnormal; Notable for the following:       Result Value   Lipase 55 (*)    All other components within normal limits  COMPREHENSIVE METABOLIC PANEL - Abnormal; Notable for the following:    Glucose, Bld 111 (*)  Total Protein 10.0 (*)    AST 50 (*)    All other components within normal limits  CBC - Abnormal; Notable for the following:    WBC 11.1 (*)    RBC 5.24 (*)    All other components within normal limits  URINALYSIS, COMPLETE (UACMP) WITH MICROSCOPIC - Abnormal; Notable for the following:    Color, Urine YELLOW (*)    APPearance HAZY (*)    Hgb urine dipstick MODERATE (*)    Leukocytes, UA TRACE (*)    Bacteria, UA RARE (*)    Squamous Epithelial / LPF 6-30 (*)    All other components within normal limits  TROPONIN I   ____________________________________________  EKG  ED ECG REPORT I, Leelynn Whetsel J, the attending physician, personally viewed and interpreted this ECG.   Date: 05/06/2017  EKG Time: 1951  Rate: 95  Rhythm: normal EKG, normal sinus rhythm  Axis: Normal  Intervals:first-degree A-V block   ST&T Change: Nonspecific  ____________________________________________  RADIOLOGY  Ct Abdomen Pelvis W Contrast  Result Date: 05/07/2017 CLINICAL DATA:  Abdominal pain for 5 days  with nausea and vomiting EXAM: CT ABDOMEN AND PELVIS WITH CONTRAST TECHNIQUE: Multidetector CT imaging of the abdomen and pelvis was performed using the standard protocol following bolus administration of intravenous contrast. CONTRAST:  132mL ISOVUE-300 IOPAMIDOL (ISOVUE-300) INJECTION 61% COMPARISON:  CT chest 03/29/2017 FINDINGS: Lower chest: Lung bases demonstrate patchy atelectasis. No consolidation or effusion. Normal heart size. Hepatobiliary: Calcified granuloma. No calcified gallstones. No biliary dilatation Pancreas: Unremarkable. No pancreatic ductal dilatation or surrounding inflammatory changes. Spleen: Normal in size without focal abnormality. Adrenals/Urinary Tract: Adrenal glands are unremarkable. Kidneys are normal, without renal calculi, focal lesion, or hydronephrosis. Bladder is unremarkable. Stomach/Bowel: Stomach is nonenlarged. No dilated small bowel. No colon wall thickening. Nonvisualization of the appendix consistent with history of appendectomy. Vascular/Lymphatic: Aortic atherosclerosis. No enlarged abdominal or pelvic lymph nodes. Reproductive: Uterus and bilateral adnexa are unremarkable. Other: No free air or free fluid. Left abdominal wall hernia containing mesentery and bowel but no evidence for obstruction. Evidence of prior hernia repair on the right. Small ventral fatty hernias as well. Musculoskeletal: No acute or significant osseous findings. IMPRESSION: 1. No evidence for a bowel obstruction or bowel wall thickening 2. Left abdominal wall hernia containing mesentery and bowel but no evidence for incarceration or obstruction at this time. 3. Prior granulomatous disease of the liver Electronically Signed   By: Donavan Foil M.D.   On: 05/07/2017 02:48    ____________________________________________   PROCEDURES  Procedure(s) performed: None  Procedures  Critical Care performed: No  ____________________________________________   INITIAL IMPRESSION / ASSESSMENT AND  PLAN / ED COURSE  Pertinent labs & imaging results that were available during my care of the patient were reviewed by me and considered in my medical decision making (see chart for details).  56 year old female with diabetes and hypertension status post hernia repair and appendectomy who presents with a four-day history of generalized abdominal pain, nausea and vomiting. Laboratory urinalysis results remarkable for mild elevation of lipase. Will continue IV fluid resuscitation, IV analgesia with antiemetic, and proceed with CT abdomen/pelvis to evaluate intra-abdominal etiology.  Clinical Course as of May 07 352  Sun May 07, 2017  0221 Patient had fallen asleep and only drunk a quarter of her bottle contrast. CT tech took her to CT scanning.  [JS]  [id number] Patient resting at this time. Tolerated PO without emesis. Discussed with patient's CT results and given strict return precautions. Patient  verbalizes understanding and agrees with plan of care.  [JS]    Clinical Course User Index [JS] Paulette Blanch, MD     ____________________________________________   FINAL CLINICAL IMPRESSION(S) / ED DIAGNOSES  Final diagnoses:  Generalized abdominal pain  Nausea and vomiting, intractability of vomiting not specified, unspecified vomiting type      NEW MEDICATIONS STARTED DURING THIS VISIT:  New Prescriptions   DICYCLOMINE (BENTYL) 20 MG TABLET    Take 1 tablet (20 mg total) by mouth every 6 (six) hours as needed.   ONDANSETRON (ZOFRAN ODT) 4 MG DISINTEGRATING TABLET    Take 1 tablet (4 mg total) by mouth every 8 (eight) hours as needed for nausea or vomiting.   PROMETHAZINE (PHENERGAN) 25 MG TABLET    Take 1 tablet (25 mg total) by mouth every 6 (six) hours as needed for nausea or vomiting.     Note:  This document was prepared using Dragon voice recognition software and may include unintentional dictation errors.    Paulette Blanch, MD 05/07/17 [phone]

## 2017-05-06 NOTE — ED Notes (Signed)
Pt assisted to wheelchair upon arrival and provided with emesis bag; c/o N/V

## 2017-05-07 ENCOUNTER — Emergency Department: Payer: Medicare (Managed Care)

## 2017-05-07 ENCOUNTER — Encounter: Payer: Self-pay | Admitting: Radiology

## 2017-05-07 MED ORDER — ONDANSETRON 4 MG PO TBDP: 4.0000 mg | ORAL_TABLET | Freq: Three times a day (TID) | ORAL | 0 refills | Status: DC | PRN

## 2017-05-07 MED ORDER — DICYCLOMINE HCL 20 MG PO TABS: 20.0000 mg | ORAL_TABLET | Freq: Four times a day (QID) | ORAL | 0 refills | Status: DC | PRN

## 2017-05-07 MED ORDER — PROMETHAZINE HCL 25 MG PO TABS: 25.0000 mg | ORAL_TABLET | Freq: Four times a day (QID) | ORAL | 0 refills | Status: DC | PRN

## 2017-05-07 MED ORDER — IOPAMIDOL (ISOVUE-300) INJECTION 61%
100.0000 mL | Freq: Once | INTRAVENOUS | Status: AC | PRN
  Administered 2017-05-07: 100 mL via INTRAVENOUS

## 2017-05-07 NOTE — ED Notes (Signed)
Reports feeling some better.  Patient drinking CT contrast.

## 2017-05-07 NOTE — Discharge Instructions (Signed)
1. You may take Zofran and/or Phenergan as needed for nausea. 2. You may take Bentyl as needed for abdominal discomfort. 3. Clear liquids 12 hours, then bland diet 3 days, then slowly advance diet as tolerated. 4. Return to the ER for worsening symptoms, persistent vomiting, difficulty breathing or other concerns.

## 2017-05-07 NOTE — ED Notes (Signed)
Patient resting at this time.

## 2017-05-07 NOTE — ED Notes (Signed)
Assisted pt to bathroom. Pt back in bed. Still nauseated.

## 2017-06-15 DIAGNOSIS — Z79899 Other long term (current) drug therapy: Secondary | ICD-10-CM | POA: Insufficient documentation

## 2017-08-28 DIAGNOSIS — R0602 Shortness of breath: Secondary | ICD-10-CM | POA: Insufficient documentation

## 2017-09-28 DIAGNOSIS — Z6841 Body Mass Index (BMI) 40.0 and over, adult: Secondary | ICD-10-CM

## 2018-02-21 ENCOUNTER — Other Ambulatory Visit: Payer: Self-pay | Admitting: Nurse Practitioner

## 2018-02-21 ENCOUNTER — Ambulatory Visit: Payer: Medicare (Managed Care) | Attending: Nurse Practitioner | Admitting: Nurse Practitioner

## 2018-02-21 ENCOUNTER — Encounter: Payer: Self-pay | Admitting: Nurse Practitioner

## 2018-02-21 ENCOUNTER — Other Ambulatory Visit: Payer: Self-pay

## 2018-02-21 VITALS — BP 129/86 | HR 106 | Temp 98.0°F | Resp 16 | Ht 60.0 in | Wt 197.0 lb

## 2018-02-21 DIAGNOSIS — E119 Type 2 diabetes mellitus without complications: Secondary | ICD-10-CM | POA: Diagnosis not present

## 2018-02-21 DIAGNOSIS — M79601 Pain in right arm: Secondary | ICD-10-CM

## 2018-02-21 DIAGNOSIS — Z6838 Body mass index (BMI) 38.0-38.9, adult: Secondary | ICD-10-CM | POA: Diagnosis not present

## 2018-02-21 DIAGNOSIS — Z79891 Long term (current) use of opiate analgesic: Secondary | ICD-10-CM | POA: Insufficient documentation

## 2018-02-21 DIAGNOSIS — M899 Disorder of bone, unspecified: Secondary | ICD-10-CM | POA: Diagnosis not present

## 2018-02-21 DIAGNOSIS — M25572 Pain in left ankle and joints of left foot: Secondary | ICD-10-CM | POA: Insufficient documentation

## 2018-02-21 DIAGNOSIS — F17200 Nicotine dependence, unspecified, uncomplicated: Secondary | ICD-10-CM | POA: Diagnosis not present

## 2018-02-21 DIAGNOSIS — R1084 Generalized abdominal pain: Secondary | ICD-10-CM | POA: Diagnosis not present

## 2018-02-21 DIAGNOSIS — J449 Chronic obstructive pulmonary disease, unspecified: Secondary | ICD-10-CM | POA: Insufficient documentation

## 2018-02-21 DIAGNOSIS — Z79899 Other long term (current) drug therapy: Secondary | ICD-10-CM | POA: Insufficient documentation

## 2018-02-21 DIAGNOSIS — M25562 Pain in left knee: Secondary | ICD-10-CM

## 2018-02-21 DIAGNOSIS — M25511 Pain in right shoulder: Secondary | ICD-10-CM | POA: Diagnosis not present

## 2018-02-21 DIAGNOSIS — M542 Cervicalgia: Secondary | ICD-10-CM | POA: Insufficient documentation

## 2018-02-21 DIAGNOSIS — F329 Major depressive disorder, single episode, unspecified: Secondary | ICD-10-CM | POA: Insufficient documentation

## 2018-02-21 DIAGNOSIS — Z789 Other specified health status: Secondary | ICD-10-CM | POA: Insufficient documentation

## 2018-02-21 DIAGNOSIS — Z794 Long term (current) use of insulin: Secondary | ICD-10-CM | POA: Insufficient documentation

## 2018-02-21 DIAGNOSIS — M25561 Pain in right knee: Secondary | ICD-10-CM | POA: Diagnosis not present

## 2018-02-21 DIAGNOSIS — M5441 Lumbago with sciatica, right side: Secondary | ICD-10-CM | POA: Diagnosis not present

## 2018-02-21 DIAGNOSIS — R109 Unspecified abdominal pain: Secondary | ICD-10-CM | POA: Diagnosis not present

## 2018-02-21 DIAGNOSIS — I10 Essential (primary) hypertension: Secondary | ICD-10-CM | POA: Insufficient documentation

## 2018-02-21 DIAGNOSIS — M79604 Pain in right leg: Secondary | ICD-10-CM | POA: Diagnosis not present

## 2018-02-21 DIAGNOSIS — M25571 Pain in right ankle and joints of right foot: Secondary | ICD-10-CM | POA: Diagnosis not present

## 2018-02-21 DIAGNOSIS — M533 Sacrococcygeal disorders, not elsewhere classified: Secondary | ICD-10-CM | POA: Insufficient documentation

## 2018-02-21 DIAGNOSIS — M79602 Pain in left arm: Secondary | ICD-10-CM

## 2018-02-21 DIAGNOSIS — G894 Chronic pain syndrome: Secondary | ICD-10-CM | POA: Diagnosis present

## 2018-02-21 DIAGNOSIS — M79603 Pain in arm, unspecified: Secondary | ICD-10-CM

## 2018-02-21 DIAGNOSIS — G8929 Other chronic pain: Secondary | ICD-10-CM | POA: Insufficient documentation

## 2018-02-21 DIAGNOSIS — M79605 Pain in left leg: Secondary | ICD-10-CM

## 2018-02-21 DIAGNOSIS — M5442 Lumbago with sciatica, left side: Secondary | ICD-10-CM | POA: Diagnosis not present

## 2018-02-21 DIAGNOSIS — M25512 Pain in left shoulder: Secondary | ICD-10-CM

## 2018-02-21 NOTE — Progress Notes (Signed)
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   8. Chronic pain of both upper extremities   9. Chronic pain of both lower extremities (L>R)   10. Chronic pain syndrome   11. Long term current use of opiate analgesic   12. Pharmacologic therapy   13. Disorder of skeletal system   14. Problems influencing health status    Plan of Care  Initial treatment plan:  Please be advised that as per protocol, today's visit has been an evaluation only. We have not taken over the patient's controlled substance management.  Problem-specific plan: No problem-specific Assessment & Plan notes found for this encounter.  Ordered Lab-work, Procedure(s), Referral(s), & Consult(s): Orders Placed This Encounter  Procedures  . DG Ankle Complete Left  . DG Ankle Complete Right  . DG Lumbar Spine Complete W/Bend  . DG Knee 1-2 Views Left  . DG Knee 1-2 Views Right  . DG Si Joints  . DG Shoulder Left  . DG Shoulder Right  . Compliance Drug Analysis, Ur  . Comp. Metabolic Panel (12)  . Magnesium  . Vitamin B12  . Sedimentation rate  . 25-Hydroxyvitamin D Lcms D2+D3  . C-reactive protein  . Ambulatory referral to Psychology   Pharmacotherapy: Medications ordered:  No orders of the defined types were placed in  this encounter.  Medications administered during this visit: Hedwig Morton had no medications administered during this visit.   Pharmacotherapy under consideration:  Opioid Analgesics: The patient was informed that there is no guarantee that she would be a candidate for opioid analgesics. The decision will be made following CDC guidelines. This decision will be based on the results of diagnostic studies, as well as Ms. Belger's risk profile.  Membrane stabilizer: To be determined at a later time Muscle relaxant: To be determined at a later time  NSAID: To be determined at a later time Other analgesic(s): To be determined at a later time   Interventional therapies under consideration: Ms. Racine was informed that there is no guarantee that she would be a candidate for interventional therapies. The decision will be based on the results of diagnostic studies, as well as Ms. Dupre's risk profile.  Possible procedure(s): Possible lidocaine infusion Diagnostic trigger point injection Diagnostic bilateral intra-articular ankle injections diagnostic bilateral intra-articular knee injection Possible Hyalgan series Diagnostic bilateral LESI Diagnostic bilateral lumbar facet nerve block Possible bilateral lumbar facet RFA Diagnostic midline CESI Diagnostic bilateral cervical facet nerve block Possible bilateral cervical facet RFA Diagnostic bilateral sacroiliac joint injections Possible bilateral sacroiliac joint RFA    Provider-requested follow-up: Return for 2nd Visit, w/ Dr. Dossie Arbour, after [facility] eval.  No future appointments.  Primary Care Physician: Lorelee Market, MD Location: [facility] Note by:  Date: 02/21/2018; Time: 2:55 PM  Pain Score Disclaimer: We use the NRS-11 scale. This is a self-reported, subjective measurement of pain severity with only modest accuracy. It is used primarily to identify changes within a particular patient. It must be  understood that outpatient pain scales are significantly less accurate that those used for research, where they can be applied under ideal controlled circumstances with minimal exposure to variables. In reality, the score is likely to be a combination of pain intensity and pain affect, where pain affect describes the degree of emotional arousal or changes in action readiness caused by the sensory experience of pain. Factors such as social and work situation, setting, emotional state, anxiety levels, expectation, and prior pain experience may influence pain perception and show large inter-individual differences that may also be affected by time variables.  Patient instructions provided during this appointment: Patient Instructions   ____________________________________________________________________________________________  Appointment Policy Summary  It is our goal and responsibility to provide the medical community with assistance in the evaluation and management of patients with chronic pain. Unfortunately our resources are limited. Because we do not have an unlimited amount of time, or available appointments, we are required to closely monitor and manage their use. The following rules exist to maximize their use:  Patient's responsibilities: 1. Punctuality:  At what time should I arrive? You should be physically present in our office 30 minutes before your scheduled appointment. Your scheduled appointment is with your assigned healthcare provider. However, it takes 5-10 minutes to be "checked-in", and another 15 minutes for the nurses to do the admission. If you arrive to our office at the time you were given for your appointment, you will end up being at least 20-25 minutes late to your appointment with the provider. 2. Tardiness:  What happens if I arrive only a few minutes after my scheduled appointment time? You will need to reschedule your appointment. The cutoff is your appointment time.  This is why it is so important that you arrive at least 30 minutes before that appointment. If you have an appointment scheduled for 10:00 AM and you arrive at 10:01, you will be required to reschedule your appointment.  3. Plan ahead:  Always assume that you will encounter traffic on your way in. Plan for it. If you are dependent on a driver, make sure they understand these rules and the need to arrive early. 4. Other appointments and responsibilities:  Avoid scheduling any other appointments before or after your pain clinic appointments.  5. Be prepared:  Write down everything that you need to discuss with your healthcare provider  and give this information to the admitting nurse. Write down the medications that you will need refilled. Bring your pills and bottles (even the empty ones), to all of your appointments, except for those where a procedure is scheduled. 6. No children or pets:  Find someone to take care of them. It is not appropriate to bring them in. 7. Scheduling changes:  We request "advanced notification" of any changes or cancellations. 8. Advanced notification:  Defined as a time period of more than 24 hours prior to the originally scheduled appointment. This allows for the appointment to be offered to other patients. 9. Rescheduling:  When a visit is rescheduled, it will require the cancellation of the original appointment. For this reason they both fall within the category of "Cancellations".  10. Cancellations:  They require advanced notification. Any cancellation less than 24 hours before the  appointment will be recorded as a "No Show". 11. No Show:  Defined as an unkept appointment where the patient failed to notify or declare to the practice their intention or inability to keep the appointment.  Corrective process for repeat offenders:  1. Tardiness: Three (3) episodes of rescheduling due to late arrivals will be recorded as one (1) "No Show". 2. Cancellation or  reschedule: Three (3) cancellations or rescheduling will be recorded as one (1) "No Show". 3. "No Shows": Three (3) "No Shows" within a 12 month period will result in discharge from the practice. ____________________________________________________________________________________________  ____________________________________________________________________________________________  Pain Scale  Introduction: The pain score used by this practice is the Verbal Numerical Rating Scale (VNRS-11). This is an 11-point scale. It is for adults and children 10 years or older. There are significant differences in how the pain score is reported, used, and applied. Forget everything you learned in the past and learn this scoring system.  General Information: The scale should reflect your current level of pain. Unless you are specifically asked for the level of your worst pain, or your average pain. If you are asked for one of these two, then it should be understood that it is over the past 24 hours.  Basic Activities of Daily Living (ADL): Personal hygiene, dressing, eating, transferring, and using restroom.  Instructions: Most patients tend to report their level of pain as a combination of two factors, their physical pain and their psychosocial pain. This last one is also known as "suffering" and it is reflection of how physical pain affects you socially and psychologically. From now on, report them separately. From this point on, when asked to report your pain level, report only your physical pain. Use the following table for reference.  Pain Clinic Pain Levels (0-5/10)  Pain Level Score  Description  No Pain 0   Mild pain 1 Nagging, annoying, but does not interfere with basic activities of daily living (ADL). Patients are able to eat, bathe, get dressed, toileting (being able to get on and off the toilet and perform personal hygiene functions), transfer (move in and out of bed or a chair without assistance),  and maintain continence (able to control bladder and bowel functions). Blood pressure and heart rate are unaffected. A normal heart rate for a healthy adult ranges from 60 to 100 bpm (beats per minute).   Mild to moderate pain 2 Noticeable and distracting. Impossible to hide from other people. More frequent flare-ups. Still possible to adapt and function close to normal. It can be very annoying and may have occasional stronger flare-ups. With discipline, patients may get used to it  and adapt.   Moderate pain 3 Interferes significantly with activities of daily living (ADL). It becomes difficult to feed, bathe, get dressed, get on and off the toilet or to perform personal hygiene functions. Difficult to get in and out of bed or a chair without assistance. Very distracting. With effort, it can be ignored when deeply involved in activities.   Moderately severe pain 4 Impossible to ignore for more than a few minutes. With effort, patients may still be able to manage work or participate in some social activities. Very difficult to concentrate. Signs of autonomic nervous system discharge are evident: dilated pupils (mydriasis); mild sweating (diaphoresis); sleep interference. Heart rate becomes elevated (>115 bpm). Diastolic blood pressure (lower number) rises above 100 mmHg. Patients find relief in laying down and not moving.   Severe pain 5 Intense and extremely unpleasant. Associated with frowning face and frequent crying. Pain overwhelms the senses.  Ability to do any activity or maintain social relationships becomes significantly limited. Conversation becomes difficult. Pacing back and forth is common, as getting into a comfortable position is nearly impossible. Pain wakes you up from deep sleep. Physical signs will be obvious: pupillary dilation; increased sweating; goosebumps; brisk reflexes; cold, clammy hands and feet; nausea, vomiting or dry heaves; loss of appetite; significant sleep disturbance with  inability to fall asleep or to remain asleep. When persistent, significant weight loss is observed due to the complete loss of appetite and sleep deprivation.  Blood pressure and heart rate becomes significantly elevated. Caution: If elevated blood pressure triggers a pounding headache associated with blurred vision, then the patient should immediately seek attention at an urgent or emergency care unit, as these may be signs of an impending stroke.    Emergency Department Pain Levels (6-10/10)  Emergency Room Pain 6 Severely limiting. Requires emergency care and should not be seen or managed at an outpatient pain management facility. Communication becomes difficult and requires great effort. Assistance to reach the emergency department may be required. Facial flushing and profuse sweating along with potentially dangerous increases in heart rate and blood pressure will be evident.   Distressing pain 7 Self-care is very difficult. Assistance is required to transport, or use restroom. Assistance to reach the emergency department will be required. Tasks requiring coordination, such as bathing and getting dressed become very difficult.   Disabling pain 8 Self-care is no longer possible. At this level, pain is disabling. The individual is unable to do even the most "basic" activities such as walking, eating, bathing, dressing, transferring to a bed, or toileting. Fine motor skills are lost. It is difficult to think clearly.   Incapacitating pain 9 Pain becomes incapacitating. Thought processing is no longer possible. Difficult to remember your own name. Control of movement and coordination are lost.   The worst pain imaginable 10 At this level, most patients pass out from pain. When this level is reached, collapse of the autonomic nervous system occurs, leading to a sudden drop in blood pressure and heart rate. This in turn results in a temporary and dramatic drop in blood flow to the brain, leading to a loss  of consciousness. Fainting is one of the body's self defense mechanisms. Passing out puts the brain in a calmed state and causes it to shut down for a while, in order to begin the healing process.    Summary: 1. Refer to this scale when providing [place] with your pain level. 2. Be accurate and careful when reporting your pain level. This will help  with your care. 3. Over-reporting your pain level will lead to loss of credibility. 4. Even a level of 1/10 means that there is pain and will be treated at our facility. 5. High, inaccurate reporting will be documented as "Symptom Exaggeration", leading to loss of credibility and suspicions of possible secondary gains such as obtaining more narcotics, or wanting to appear disabled, for fraudulent reasons. 6. Only pain levels of 5 or below will be seen at our facility. 7. Pain levels of 6 and above will be sent to the Emergency Department and the appointment cancelled. ____________________________________________________________________________________________

## 2018-02-21 NOTE — Patient Instructions (Signed)

## 2018-02-21 NOTE — Progress Notes (Signed)
Safety precautions to be maintained throughout the outpatient stay will include: orient to surroundings, keep bed in low position, maintain call bell within reach at all times, provide assistance with transfer out of bed and ambulation.   Patient reports that she suffers from sleep apnea but does not have C pap machine yet.

## 2018-02-26 LAB — COMP. METABOLIC PANEL (12)
ALK PHOS: 132 IU/L — AB (ref 39–117)
AST: 25 IU/L (ref 0–40)
Albumin/Globulin Ratio: 1.3 (ref 1.2–2.2)
Albumin: 4.2 g/dL (ref 3.5–5.5)
BUN / CREAT RATIO: 16 (ref 9–23)
BUN: 12 mg/dL (ref 6–24)
Bilirubin Total: 0.3 mg/dL (ref 0.0–1.2)
CREATININE: 0.76 mg/dL (ref 0.57–1.00)
Calcium: 9.4 mg/dL (ref 8.7–10.2)
Chloride: 102 mmol/L (ref 96–106)
GFR calc Af Amer: 103 mL/min/{1.73_m2} (ref >59)
GFR, EST NON AFRICAN AMERICAN: 89 mL/min/{1.73_m2} (ref >59)
GLUCOSE: 101 mg/dL — AB (ref 65–99)
Globulin, Total: 3.2 g/dL (ref 1.5–4.5)
Potassium: 4.7 mmol/L (ref 3.5–5.2)
SODIUM: 142 mmol/L (ref 134–144)
TOTAL PROTEIN: 7.4 g/dL (ref 6.0–8.5)

## 2018-02-26 LAB — 25-HYDROXYVITAMIN D LCMS D2+D3

## 2018-02-26 LAB — 25-HYDROXY VITAMIN D LCMS D2+D3
25-Hydroxy, Vitamin D-3: 25 ng/mL
25-Hydroxy, Vitamin D: 25 ng/mL — ABNORMAL LOW

## 2018-02-26 LAB — MAGNESIUM: MAGNESIUM: 2 mg/dL (ref 1.6–2.3)

## 2018-02-26 LAB — C-REACTIVE PROTEIN: CRP: 16.6 mg/L — ABNORMAL HIGH (ref 0.0–4.9)

## 2018-02-26 LAB — SEDIMENTATION RATE: Sed Rate: 23 mm/hr (ref 0–40)

## 2018-02-26 LAB — VITAMIN B12: VITAMIN B 12: 601 pg/mL (ref 232–1245)

## 2018-02-27 LAB — COMPLIANCE DRUG ANALYSIS, UR

## 2018-03-01 ENCOUNTER — Ambulatory Visit
Admission: RE | Admit: 2018-03-01 | Discharge: 2018-03-01 | Disposition: A | Discharge status: Home / Self Care | Payer: Medicare (Managed Care) | Source: Ambulatory Visit | Attending: Nurse Practitioner | Admitting: Nurse Practitioner

## 2018-03-01 DIAGNOSIS — M25511 Pain in right shoulder: Secondary | ICD-10-CM | POA: Insufficient documentation

## 2018-03-01 DIAGNOSIS — M25562 Pain in left knee: Secondary | ICD-10-CM | POA: Diagnosis not present

## 2018-03-01 DIAGNOSIS — M25512 Pain in left shoulder: Secondary | ICD-10-CM

## 2018-03-01 DIAGNOSIS — M5136 Other intervertebral disc degeneration, lumbar region: Secondary | ICD-10-CM | POA: Diagnosis not present

## 2018-03-01 DIAGNOSIS — M7731 Calcaneal spur, right foot: Secondary | ICD-10-CM | POA: Diagnosis not present

## 2018-03-01 DIAGNOSIS — G8929 Other chronic pain: Secondary | ICD-10-CM | POA: Insufficient documentation

## 2018-03-01 DIAGNOSIS — M25561 Pain in right knee: Secondary | ICD-10-CM

## 2018-03-01 DIAGNOSIS — M7732 Calcaneal spur, left foot: Secondary | ICD-10-CM | POA: Insufficient documentation

## 2018-03-01 DIAGNOSIS — M25572 Pain in left ankle and joints of left foot: Principal | ICD-10-CM

## 2018-03-01 DIAGNOSIS — M5442 Lumbago with sciatica, left side: Secondary | ICD-10-CM

## 2018-03-01 DIAGNOSIS — M25571 Pain in right ankle and joints of right foot: Principal | ICD-10-CM

## 2018-03-01 DIAGNOSIS — M5441 Lumbago with sciatica, right side: Secondary | ICD-10-CM

## 2018-03-01 DIAGNOSIS — M533 Sacrococcygeal disorders, not elsewhere classified: Secondary | ICD-10-CM

## 2018-03-01 NOTE — Progress Notes (Signed)
Results were reviewed and found to be: mildly abnormal  No acute injury or pathology identified  Review would suggest interventional pain management techniques may be of benefit 

## 2018-03-01 NOTE — Progress Notes (Signed)
Results were reviewed and found to be: significantly abnormal  Surgical consultation may be of benefit  Review would suggest no procedures needed at this time

## 2018-08-14 ENCOUNTER — Other Ambulatory Visit: Payer: Self-pay

## 2018-08-14 ENCOUNTER — Emergency Department: Payer: Medicare (Managed Care)

## 2018-08-14 ENCOUNTER — Encounter: Payer: Self-pay | Admitting: Emergency Medicine

## 2018-08-14 ENCOUNTER — Observation Stay
Admission: EM | Admit: 2018-08-14 | Discharge: 2018-08-16 | Disposition: A | Discharge status: Home / Self Care | Payer: Medicare (Managed Care) | Source: Home / Self Care | Attending: Emergency Medicine | Admitting: Emergency Medicine

## 2018-08-14 DIAGNOSIS — G894 Chronic pain syndrome: Secondary | ICD-10-CM | POA: Insufficient documentation

## 2018-08-14 DIAGNOSIS — Z87891 Personal history of nicotine dependence: Secondary | ICD-10-CM

## 2018-08-14 DIAGNOSIS — M461 Sacroiliitis, not elsewhere classified: Secondary | ICD-10-CM | POA: Insufficient documentation

## 2018-08-14 DIAGNOSIS — M797 Fibromyalgia: Secondary | ICD-10-CM

## 2018-08-14 DIAGNOSIS — K5790 Diverticulosis of intestine, part unspecified, without perforation or abscess without bleeding: Secondary | ICD-10-CM

## 2018-08-14 DIAGNOSIS — J449 Chronic obstructive pulmonary disease, unspecified: Secondary | ICD-10-CM

## 2018-08-14 DIAGNOSIS — E119 Type 2 diabetes mellitus without complications: Secondary | ICD-10-CM | POA: Insufficient documentation

## 2018-08-14 DIAGNOSIS — M5136 Other intervertebral disc degeneration, lumbar region: Secondary | ICD-10-CM

## 2018-08-14 DIAGNOSIS — K432 Incisional hernia without obstruction or gangrene: Secondary | ICD-10-CM | POA: Diagnosis present

## 2018-08-14 DIAGNOSIS — I10 Essential (primary) hypertension: Secondary | ICD-10-CM

## 2018-08-14 DIAGNOSIS — F329 Major depressive disorder, single episode, unspecified: Secondary | ICD-10-CM

## 2018-08-14 DIAGNOSIS — Z79899 Other long term (current) drug therapy: Secondary | ICD-10-CM | POA: Insufficient documentation

## 2018-08-14 DIAGNOSIS — Z79891 Long term (current) use of opiate analgesic: Secondary | ICD-10-CM | POA: Insufficient documentation

## 2018-08-14 DIAGNOSIS — K56609 Unspecified intestinal obstruction, unspecified as to partial versus complete obstruction: Secondary | ICD-10-CM

## 2018-08-14 DIAGNOSIS — K430 Incisional hernia with obstruction, without gangrene: Secondary | ICD-10-CM | POA: Insufficient documentation

## 2018-08-14 DIAGNOSIS — Z6836 Body mass index (BMI) 36.0-36.9, adult: Secondary | ICD-10-CM

## 2018-08-14 DIAGNOSIS — M1990 Unspecified osteoarthritis, unspecified site: Secondary | ICD-10-CM

## 2018-08-14 DIAGNOSIS — Z794 Long term (current) use of insulin: Secondary | ICD-10-CM

## 2018-08-14 DIAGNOSIS — K439 Ventral hernia without obstruction or gangrene: Secondary | ICD-10-CM

## 2018-08-14 LAB — URINALYSIS, COMPLETE (UACMP) WITH MICROSCOPIC
BACTERIA UA: NONE SEEN
BILIRUBIN URINE: NEGATIVE
Glucose, UA: NEGATIVE mg/dL
KETONES UR: NEGATIVE mg/dL
LEUKOCYTES UA: NEGATIVE
NITRITE: NEGATIVE
PROTEIN: NEGATIVE mg/dL
SPECIFIC GRAVITY, URINE: 1.02 (ref 1.005–1.030)
pH: 5 (ref 5.0–8.0)

## 2018-08-14 LAB — COMPREHENSIVE METABOLIC PANEL
ALT: 36 U/L (ref 0–44)
ANION GAP: 6 (ref 5–15)
AST: 47 U/L — AB (ref 15–41)
Albumin: 4.1 g/dL (ref 3.5–5.0)
Alkaline Phosphatase: 118 U/L (ref 38–126)
BUN: 16 mg/dL (ref 6–20)
CHLORIDE: 104 mmol/L (ref 98–111)
CO2: 28 mmol/L (ref 22–32)
Calcium: 8.9 mg/dL (ref 8.9–10.3)
Creatinine, Ser: 0.72 mg/dL (ref 0.44–1.00)
GFR calc Af Amer: >60 mL/min (ref >60)
GFR calc non Af Amer: >60 mL/min (ref >60)
Glucose, Bld: 130 mg/dL — ABNORMAL HIGH (ref 70–99)
POTASSIUM: 4 mmol/L (ref 3.5–5.1)
Sodium: 138 mmol/L (ref 135–145)
TOTAL PROTEIN: 8 g/dL (ref 6.5–8.1)
Total Bilirubin: 0.6 mg/dL (ref 0.3–1.2)

## 2018-08-14 LAB — TROPONIN I

## 2018-08-14 LAB — CBC
HEMATOCRIT: 41.7 % (ref 35.0–47.0)
HEMOGLOBIN: 14.8 g/dL (ref 12.0–16.0)
MCH: 32.4 pg (ref 26.0–34.0)
MCHC: 35.5 g/dL (ref 32.0–36.0)
MCV: 91.2 fL (ref 80.0–100.0)
Platelets: 125 10*3/uL — ABNORMAL LOW (ref 150–440)
RBC: 4.57 MIL/uL (ref 3.80–5.20)
RDW: 13.3 % (ref 11.5–14.5)
WBC: 10.3 10*3/uL (ref 3.6–11.0)

## 2018-08-14 LAB — LIPASE, BLOOD: LIPASE: 40 U/L (ref 11–51)

## 2018-08-14 MED ORDER — ROCURONIUM BROMIDE 50 MG/5ML IV SOLN
INTRAVENOUS | Status: AC
  Filled 2018-08-14: qty 1

## 2018-08-14 MED ORDER — DEXAMETHASONE SODIUM PHOSPHATE 10 MG/ML IJ SOLN
INTRAMUSCULAR | Status: AC
  Filled 2018-08-14: qty 1

## 2018-08-14 MED ORDER — HYDROMORPHONE HCL 1 MG/ML IJ SOLN
1.0000 mg | Freq: Once | INTRAMUSCULAR | Status: AC
  Administered 2018-08-14: 1 mg via INTRAVENOUS
  Filled 2018-08-14: qty 1

## 2018-08-14 MED ORDER — IOPAMIDOL (ISOVUE-300) INJECTION 61%
30.0000 mL | Freq: Once | INTRAVENOUS | Status: AC | PRN
  Administered 2018-08-14: 30 mL via ORAL

## 2018-08-14 MED ORDER — CEFAZOLIN SODIUM-DEXTROSE 2-4 GM/100ML-% IV SOLN
2.0000 g | Freq: Once | INTRAVENOUS | Status: AC
  Administered 2018-08-15: 2 g via INTRAVENOUS
  Filled 2018-08-14: qty 100

## 2018-08-14 MED ORDER — MIDAZOLAM HCL 2 MG/2ML IJ SOLN
INTRAMUSCULAR | Status: AC
  Filled 2018-08-14: qty 2

## 2018-08-14 MED ORDER — ONDANSETRON HCL 4 MG/2ML IJ SOLN
4.0000 mg | Freq: Once | INTRAMUSCULAR | Status: AC
  Administered 2018-08-14: 4 mg via INTRAVENOUS
  Filled 2018-08-14: qty 2

## 2018-08-14 MED ORDER — EPHEDRINE SULFATE 50 MG/ML IJ SOLN
INTRAMUSCULAR | Status: AC
  Filled 2018-08-14: qty 1

## 2018-08-14 MED ORDER — PROPOFOL 10 MG/ML IV BOLUS
INTRAVENOUS | Status: AC
  Filled 2018-08-14: qty 20

## 2018-08-14 MED ORDER — SUCCINYLCHOLINE CHLORIDE 20 MG/ML IJ SOLN
INTRAMUSCULAR | Status: AC
  Filled 2018-08-14: qty 1

## 2018-08-14 MED ORDER — LIDOCAINE HCL (PF) 2 % IJ SOLN
INTRAMUSCULAR | Status: AC
  Filled 2018-08-14: qty 10

## 2018-08-14 MED ORDER — SODIUM CHLORIDE 0.9 % IV BOLUS
1000.0000 mL | Freq: Once | INTRAVENOUS | Status: AC
  Administered 2018-08-14: 1000 mL via INTRAVENOUS

## 2018-08-14 MED ORDER — FENTANYL CITRATE (PF) 100 MCG/2ML IJ SOLN
INTRAMUSCULAR | Status: AC
  Filled 2018-08-14: qty 2

## 2018-08-14 MED ORDER — IOPAMIDOL (ISOVUE-300) INJECTION 61%
100.0000 mL | Freq: Once | INTRAVENOUS | Status: AC | PRN
  Administered 2018-08-14: 100 mL via INTRAVENOUS

## 2018-08-14 MED ORDER — ONDANSETRON HCL 4 MG/2ML IJ SOLN
INTRAMUSCULAR | Status: AC
  Filled 2018-08-14: qty 2

## 2018-08-14 NOTE — ED Provider Notes (Addendum)
[facility] Emergency Department Provider Note  Time seen: 7:44 PM  I have reviewed the triage vital signs and the nursing notes.   HISTORY  Chief Complaint Abdominal Pain    HPI Kelly Dillon is a 57 y.o. female with a past medical history of COPD, diabetes, hypertension, chronic pain, abdominal hernia, presents to the emergency department for left-sided abdominal pain.  According to the patient for the past 2 months she has intermittently been experiencing pain in the left side of her abdomen related to an internal hernia per patient.  She states over the past 2 days the pain is been constant and severe, aching pain in the left side she has been very nauseated with frequent vomiting.  States normal bowel movements.  Denies fever.  Denies dysuria.   Past Medical History:  Diagnosis Date  . COPD (chronic obstructive pulmonary disease) ([place])   . Diabetes mellitus without complication ([place])   . Hypertension     Patient Active Problem List   Diagnosis Date Noted  . Chronic generalized abdominal pain (Primary Area of Pain) 02/21/2018  . Chronic ankle pain, bilateral (Secondary Area of Pain) (L>R) 02/21/2018  . Chronic pain of both knees [facility] Area of Pain) (L>R) 02/21/2018  . Chronic bilateral low back pain with bilateral sciatica (Fourth Area of Pain) (L>R) 02/21/2018  . Chronic neck pain(midline) 02/21/2018  . Chronic pain of both shoulders(L>R) 02/21/2018  . Chronic upper extremity pain (L>R) 02/21/2018  . Chronic pain of both lower extremities (L>R) 02/21/2018  . Chronic pain syndrome 02/21/2018  . Long term current use of opiate analgesic 02/21/2018  . Pharmacologic therapy 02/21/2018  . Disorder of skeletal system 02/21/2018  . Problems influencing health status 02/21/2018  . Morbid obesity with BMI of 40.0-44.9, adult ([place]) 09/28/2017  . Shortness of breath 08/28/2017  . High risk medication use 06/15/2017  . Multiple joint pain 01/24/2017  .  Hernia of abdominal wall 01/12/2017  . Sacroiliitis ([place]) 10/06/2016  . Colon polyp 06/11/2016  . BMI 39.0-39.9,adult 02/18/2016  . Excessive daytime sleepiness 11/11/2015  . Avascular necrosis of bones of both hips ([place]) 04/20/2015  . Mixed stress and urge urinary incontinence 12/09/2014  . Controlled type 2 diabetes mellitus without complication ([place]) 23/55/7322  . Chronic, continuous use of opioids 03/13/2013  . DDD (degenerative disc disease), lumbar 03/13/2013  . Suicide attempt ([place]) 03/13/2013  . Recurrent ventral hernia 08/28/2012  . Moderate COPD (chronic obstructive pulmonary disease) ([place]) 07/11/2012  . Tobacco abuse 03/01/2012  . Depression, major, recurrent, moderate ([place]) 07/21/2011  . Diverticulosis 07/21/2011  . Fibromyalgia 07/21/2011    Past Surgical History:  Procedure Laterality Date  . APPENDECTOMY    . HERNIA REPAIR    . TUBAL LIGATION      Prior to Admission medications   Medication Sig Start Date End Date Taking? Authorizing Provider  ADVAIR DISKUS 250-50 MCG/DOSE AEPB Inhale 1 puff into the lungs 2 (two) times daily. 01/19/17   [provider]  albuterol (PROVENTIL HFA;VENTOLIN HFA) 108 (90 Base) MCG/ACT inhaler Inhale 2 puffs into the lungs every 6 (six) hours as needed for wheezing or shortness of breath. 03/28/17   Darel Hong, MD  atorvastatin (LIPITOR) 40 MG tablet Take 1 tablet by mouth daily. 11/07/17 11/07/18  [provider]  insulin glargine (LANTUS) 100 unit/mL SOPN Inject 0.15 mLs (15 Units total) into the skin at bedtime. 04/01/17   Sudini, Alveta Heimlich, MD  insulin lispro (HUMALOG KWIKPEN) 100 UNIT/ML KiwkPen Inject 0.15 mLs (15  Units total) into the skin daily with breakfast. 04/01/17   Hillary Bow, MD  ipratropium-albuterol (DUONEB) 0.5-2.5 (3) MG/3ML SOLN Take 3 mLs by nebulization QID. 04/01/17   Sudini, Srikar, MD  LYRICA 200 MG capsule Take 1 capsule by mouth 3 (three) times daily as needed. 02/22/17   [provider]   metFORMIN (GLUCOPHAGE) 1000 MG tablet Take 1 tablet by mouth 2 (two) times daily. 01/12/17   [provider]  omeprazole (PRILOSEC) 40 MG capsule Take 1 capsule by mouth daily. 02/18/17   [provider]  oxybutynin (DITROPAN) 5 MG tablet Take 5 mg by mouth daily. 10/09/17 10/09/18  [provider]  Spacer/Aero Chamber Mouthpiece MISC 1 Units by Does not apply route every 4 (four) hours as needed (wheezing). 03/28/17   Darel Hong, MD  tiZANidine (ZANAFLEX) 2 MG tablet Take 1 tablet by mouth every 8 (eight) hours as needed. 08/24/17   [provider]  traZODone (DESYREL) 50 MG tablet Take 1 tablet by mouth daily. 07/12/16   [provider]  venlafaxine XR (EFFEXOR-XR) 150 MG 24 hr capsule Take 1 capsule by mouth daily. 03/19/17   [provider]    No Known Allergies  No family history on file.  Social History Social History   Tobacco Use  . Smoking status: Former Smoker    Types: Cigarettes    Last attempt to quit: 02/19/2017    Years since quitting: 1.4  . Smokeless tobacco: Never Used  Substance Use Topics  . Alcohol use: No  . Drug use: No    Review of Systems Constitutional: Negative for fever. Cardiovascular: Negative for chest pain. Respiratory: Negative for shortness of breath. Gastrointestinal: Severe left-sided abdominal pain.  Positive for nausea vomiting.  Negative for diarrhea.  Negative constipation.  Normal bowel movements per patient. Genitourinary: Negative for dysuria.  States occasional incontinence. Musculoskeletal: Negative for musculoskeletal complaints Skin: Negative for skin complaints  Neurological: Negative for headache All other ROS negative  ____________________________________________   PHYSICAL EXAM:  VITAL SIGNS: ED Triage Vitals  Enc Vitals Group     BP 08/14/18 1908 (!) 157/105     Pulse Rate 08/14/18 1908 (!) 111     Resp 08/14/18 1908 (!) 22     Temp 08/14/18 1908 98.2 F (36.8 C)      Temp Source 08/14/18 1908 Oral     SpO2 08/14/18 1908 98 %     Weight 08/14/18 1911 182 lb (82.6 kg)     Height 08/14/18 1911 4\' 11"  (1.499 m)     Head Circumference --      Peak Flow --      Pain Score 08/14/18 1909 10     Pain Loc --      Pain Edu? --      Excl. in [place]? --    Constitutional: Alert and oriented.  Mild distress due to abdominal pain. Eyes: Normal exam ENT   Head: Normocephalic and atraumatic.   Mouth/Throat: Mucous membranes are moist. Cardiovascular: Normal rate, regular rhythm.  Respiratory: Normal respiratory effort without tachypnea nor retractions. Breath sounds are clear Gastrointestinal: Patient has mild distention to the left side of her abdomen, prior laparotomy scar.  Moderate tenderness left side of the abdomen, no rebound or guarding. Musculoskeletal: Nontender with normal range of motion in all extremities. Neurologic:  Normal speech and language. No gross focal neurologic deficits Skin:  Skin is warm, dry and intact.  Psychiatric: Mood and affect are normal.   ____________________________________________  RADIOLOGY  IMPRESSION: Large hernia is seen through left lateral abdominal wall which contains a loop of small bowel with surrounding inflammation. The loop of small bowel does appear to be dilated within the hernia with mild dilatation of the more proximal small bowel suggesting some degree of obstruction.  ____________________________________________   INITIAL IMPRESSION / ASSESSMENT AND PLAN / ED COURSE  Pertinent labs & imaging results that were available during my care of the patient were reviewed by me and considered in my medical decision making (see chart for details).  Patient presents to the emergency department for left-sided abdominal pain nausea vomiting over the past 2 months, worse over the past 2 days.  Differential would include small bowel obstruction, partial SBO, intra-abdominal pathology such as  diverticulitis, colitis, gastroparesis.  We will check labs, treat pain and nausea, IV hydrate and obtain CT imaging of the abdomen/pelvis to further evaluate.  Patient agreeable to plan of care.  CT shows a large abdominal wall hernia that appears to have obstruction.  I discussed patient with general surgery they are currently in the operating room but will be down shortly to see the patient is.  I discussed this with the patient she is agreeable to plan of care.  ____________________________________________   FINAL CLINICAL IMPRESSION(S) / ED DIAGNOSES  Left sided abdominal pain Emesis Abdominal wall hernia Small bowel obstruction   Harvest Dark, MD 08/14/18 2218    Harvest Dark, MD 08/27/18 1642

## 2018-08-14 NOTE — H&P (Addendum)
Subjective:   CC: recurrent ventral hernia  HPI:  Kelly Dillon is a 57 y.o. female who was referred by [facility] for evaluation of above cc.   History of previous ventral hernia repair in 2010.  Patient states this current recurrent hernia first noticed 2 years ago with intermittent pain associated with nausea vomiting.  This last episode has been the worse thus far in prompted her to visit the ED with there are CT scans concerning for possible bowel obstruction with transition point within the hernia.  Despite multiple doses of Dilaudid the patient states the pain persists worsening with palpation in the area.  Pain is to severe to attempt reduction on her own.    Past Medical History:  has a past medical history of COPD (chronic obstructive pulmonary disease) ([place]), Diabetes mellitus without complication ([place]), and Hypertension.  Past Surgical History:  Past Surgical History:  Procedure Laterality Date  . APPENDECTOMY    . HERNIA REPAIR    . TUBAL LIGATION      Family History: non-contributory  Social History:  reports that she quit smoking about 17 months ago. Her smoking use included cigarettes. She has never used smokeless tobacco. She reports that she does not drink alcohol or use drugs.  Current Medications: none reported Allergies:  Allergies as of 08/14/2018  . (No Known Allergies)    ROS:  A 15 point review of systems was performed and pertinent positives and negatives noted in HPI   Objective:     BP (!) 144/100   Pulse 75   Temp 98.2 F (36.8 C) (Oral)   Resp (!) 24   Ht 4\' 11"  (1.499 m)   Wt 82.6 kg   SpO2 96%   BMI 36.76 kg/m   Constitutional :  alert, cooperative and appears stated age  Lymphatics/Throat:  no asymmetry, masses, or scars  Respiratory:  clear to auscultation bilaterally  Cardiovascular:  regular rate and rhythm  Gastrointestinal: obese, but obviously visible LLQ venteral hernia with no overlying skin changes.  Extereme tenderness to touch  reported, but able to some what distract.  At least partialy reducible with some distraction, but no change in reported pain.  Hernia easily recurs with change in position.  Musculoskeletal: lying in bed, no obvious difficulty moving upper extremities  Skin: Cool and moist  Psychiatric: Normal affect, non-agitated, not confused       LABS:  CMP Latest Ref Rng & Units 08/14/2018 02/21/2018 05/06/2017  Glucose 70 - 99 mg/dL 130(H) 101(H) 111(H)  BUN 6 - 20 mg/dL 16 12 15   Creatinine 0.44 - 1.00 mg/dL 0.72 0.76 0.83  Sodium 135 - 145 mmol/L 138 142 137  Potassium 3.5 - 5.1 mmol/L 4.0 4.7 3.8  Chloride 98 - 111 mmol/L 104 102 103  CO2 22 - 32 mmol/L 28 - 26  Calcium 8.9 - 10.3 mg/dL 8.9 9.4 9.3  Total Protein 6.5 - 8.1 g/dL 8.0 7.4 10.0(H)  Total Bilirubin 0.3 - 1.2 mg/dL 0.6 0.3 0.7  Alkaline Phos 38 - 126 U/L 118 132(H) 116  AST 15 - 41 U/L 47(H) 25 50(H)  ALT 0 - 44 U/L 36 - 34   CBC Latest Ref Rng & Units 08/14/2018 05/06/2017 03/30/2017  WBC 3.6 - 11.0 K/uL 10.3 11.1(H) 18.5(H)  Hemoglobin 12.0 - 16.0 g/dL 14.8 15.8 14.4  Hematocrit 35.0 - 47.0 % 41.7 46.4 42.4  Platelets 150 - 440 K/uL 125(L) 207 185    RADS: CLINICAL DATA:  Acute left upper quadrant abdominal  pain.  EXAM: CT ABDOMEN AND PELVIS WITH CONTRAST  TECHNIQUE: Multidetector CT imaging of the abdomen and pelvis was performed using the standard protocol following bolus administration of intravenous contrast.  CONTRAST:  166mL ISOVUE-300 IOPAMIDOL (ISOVUE-300) INJECTION 61%  COMPARISON:  CT scan of May 07, 2017.  FINDINGS: Lower chest: No acute abnormality.  Hepatobiliary: No focal liver abnormality is seen. No gallstones, gallbladder wall thickening, or biliary dilatation.  Pancreas: Unremarkable. No pancreatic ductal dilatation or surrounding inflammatory changes.  Spleen: Normal in size without focal abnormality.  Adrenals/Urinary Tract: Adrenal glands are unremarkable. Kidneys are normal,  without renal calculi, focal lesion, or hydronephrosis. Bladder is unremarkable.  Stomach/Bowel: The stomach appears normal. Status post appendectomy. Large hernia is noted in left lateral abdominal wall which contains a loop of small bowel with surrounding inflammation. This appears to resulting in mild partial small bowel obstruction as there is dilatation of the more proximal small bowel. The bowel loops within the hernia also appear to be dilated.  Vascular/Lymphatic: No significant vascular findings are present. No enlarged abdominal or pelvic lymph nodes.  Reproductive: Uterus and bilateral adnexa are unremarkable.  Other: Status post ventral hernia repair. No abnormal fluid collection is noted.  Musculoskeletal: No acute or significant osseous findings.  IMPRESSION: Large hernia is seen through left lateral abdominal wall which contains a loop of small bowel with surrounding inflammation. The loop of small bowel does appear to be dilated within the hernia with mild dilatation of the more proximal small bowel suggesting some degree of obstruction.   Electronically Signed   By: Marijo Conception, M.D.   On: 08/14/2018 21:09 Assessment:     Recurrent ventral hernia Chronic pain syndrome DM COPD Plan:    Discussed the risk of surgery including recurrence, which can be up to 50% in the case of incisional or complex hernias, possible use of prosthetic materials (mesh) and the increased risk of mesh infxn if used, bleeding, chronic pain, post-op infxn, post-op SBO or ileus, and possible re-operation to address said risks. The risks of general anesthetic, if used, includes MI, CVA, sudden death or even reaction to anesthetic medications also discussed. Alternatives include continued observation.  Benefits include possible symptom relief, prevention of incarceration, strangulation, enlargement in size over time, and the risk of emergency surgery in the face of  strangulation.  Typical post-op recovery time of 3-5 days with 4-6 weeks of activity restrictions were also discussed.  The patient verbalized understanding and all questions were answered to the patient's satisfaction.  Clinically the hernia is at least partially reducible and soft with no overlying skin changes.  With her extensive history of chronic pain syndrome and noncompliance with a pain contract I cannot completely exclude part of her pain may be not related to her hernia.  Despite this as well as a normal white count, CT does show some inflammation along with subtle clues of possible obstruction with transition point at the hernia site.  In addition, patient states that the pain is no better even when it is partially reduced at bedside and the hernia does immediately recur as soon as she changes from a supine position to a sitting position.    In summary with the persistent pain, as well as possible inflammation of the small bowel we have agreed to proceed with urgent repair.  I explicitly explained to the patient that this repair would be  Not be an ideal repair for this specific type of hernia due to its emergent nature and  that it has a very high chance of recurrence and future issues down the road.  I also explicitly stated that she will not be completely pain-free postprocedure and likely will have to address a different kind of pain from what she is experiencing currently.  Her continued smoking habits, obesity and diabetes also contribute to increase chance of complications postop as well.  All this was explained extensively to the patient and the patient verbalized understanding and still wishes to proceed with the surgery.

## 2018-08-14 NOTE — ED Notes (Signed)
Pt taken to room 6 via w/c by EDT Lattie Haw to be placed on card monitor for EKG; report called to care nurse Ophelia Charter, RN

## 2018-08-14 NOTE — ED Notes (Signed)
Per Joelene Millin RN request im sending Ancef to OR with patient

## 2018-08-14 NOTE — ED Triage Notes (Addendum)
Pt to triage via w/c, appears uncomfortable; c/o left upper abd pain radiating into back accomp by N/V x 2 today; st has large hernia that needs repair but was told she needs to lose weight 1st; st for last month has had stools that are ribbon shaped

## 2018-08-15 ENCOUNTER — Encounter
Admission: EM | Disposition: A | Discharge status: Home / Self Care | Payer: Self-pay | Source: Home / Self Care | Attending: Emergency Medicine

## 2018-08-15 ENCOUNTER — Emergency Department: Payer: Medicare (Managed Care) | Admitting: Anesthesiology

## 2018-08-15 ENCOUNTER — Other Ambulatory Visit: Payer: Self-pay

## 2018-08-15 ENCOUNTER — Encounter: Payer: Self-pay | Admitting: Surgery

## 2018-08-15 DIAGNOSIS — K432 Incisional hernia without obstruction or gangrene: Secondary | ICD-10-CM | POA: Diagnosis present

## 2018-08-15 HISTORY — PX: VENTRAL HERNIA REPAIR: SHX424

## 2018-08-15 LAB — GLUCOSE, CAPILLARY
GLUCOSE-CAPILLARY: 116 mg/dL — AB (ref 70–99)
GLUCOSE-CAPILLARY: 233 mg/dL — AB (ref 70–99)
GLUCOSE-CAPILLARY: 211 mg/dL — AB (ref 70–99)
Glucose-Capillary: 186 mg/dL — ABNORMAL HIGH (ref 70–99)
Glucose-Capillary: 187 mg/dL — ABNORMAL HIGH (ref 70–99)

## 2018-08-15 LAB — CBC
HCT: 39 % (ref 35.0–47.0)
HEMOGLOBIN: 13.7 g/dL (ref 12.0–16.0)
MCH: 32.3 pg (ref 26.0–34.0)
MCHC: 35.2 g/dL (ref 32.0–36.0)
MCV: 91.6 fL (ref 80.0–100.0)
Platelets: 168 10*3/uL (ref 150–440)
RBC: 4.26 MIL/uL (ref 3.80–5.20)
RDW: 13.5 % (ref 11.5–14.5)
WBC: 8.9 10*3/uL (ref 3.6–11.0)

## 2018-08-15 LAB — CREATININE, SERUM: Creatinine, Ser: 0.55 mg/dL (ref 0.44–1.00)

## 2018-08-15 SURGERY — REPAIR, HERNIA, VENTRAL
Anesthesia: General

## 2018-08-15 MED ORDER — PHENYLEPHRINE HCL 10 MG/ML IJ SOLN
INTRAMUSCULAR | Status: DC | PRN
  Administered 2018-08-15 (×2): 100 ug via INTRAVENOUS

## 2018-08-15 MED ORDER — FENTANYL CITRATE (PF) 100 MCG/2ML IJ SOLN
INTRAMUSCULAR | Status: DC | PRN
  Administered 2018-08-15 (×4): 25 ug via INTRAVENOUS

## 2018-08-15 MED ORDER — INFLUENZA VAC SPLIT QUAD 0.5 ML IM SUSY: 0.5000 mL | PREFILLED_SYRINGE | INTRAMUSCULAR | Status: DC

## 2018-08-15 MED ORDER — SUCCINYLCHOLINE CHLORIDE 20 MG/ML IJ SOLN
INTRAMUSCULAR | Status: DC | PRN
  Administered 2018-08-15: 100 mg via INTRAVENOUS

## 2018-08-15 MED ORDER — PANTOPRAZOLE SODIUM 40 MG PO TBEC
40.0000 mg | DELAYED_RELEASE_TABLET | Freq: Every day | ORAL | Status: DC
  Administered 2018-08-15 – 2018-08-16 (×2): 40 mg via ORAL
  Filled 2018-08-15 (×3): qty 1

## 2018-08-15 MED ORDER — METFORMIN HCL 500 MG PO TABS
1000.0000 mg | ORAL_TABLET | Freq: Two times a day (BID) | ORAL | Status: DC
  Administered 2018-08-15 (×2): 1000 mg via ORAL
  Filled 2018-08-15 (×2): qty 2

## 2018-08-15 MED ORDER — ALBUTEROL SULFATE (2.5 MG/3ML) 0.083% IN NEBU: 3.0000 mL | INHALATION_SOLUTION | Freq: Four times a day (QID) | RESPIRATORY_TRACT | Status: DC | PRN

## 2018-08-15 MED ORDER — OXYBUTYNIN CHLORIDE 5 MG PO TABS
5.0000 mg | ORAL_TABLET | Freq: Two times a day (BID) | ORAL | Status: DC
  Administered 2018-08-15 – 2018-08-16 (×3): 5 mg via ORAL
  Filled 2018-08-15 (×3): qty 1

## 2018-08-15 MED ORDER — IPRATROPIUM-ALBUTEROL 0.5-2.5 (3) MG/3ML IN SOLN
3.0000 mL | Freq: Four times a day (QID) | RESPIRATORY_TRACT | Status: DC
  Administered 2018-08-15 – 2018-08-16 (×6): 3 mL via RESPIRATORY_TRACT
  Filled 2018-08-15 (×7): qty 3

## 2018-08-15 MED ORDER — INSULIN ASPART 100 UNIT/ML ~~LOC~~ SOLN
0.0000 [IU] | Freq: Three times a day (TID) | SUBCUTANEOUS | Status: DC
  Administered 2018-08-15 (×2): 7 [IU] via SUBCUTANEOUS
  Administered 2018-08-15: 4 [IU] via SUBCUTANEOUS
  Filled 2018-08-15 (×3): qty 1

## 2018-08-15 MED ORDER — ONDANSETRON HCL 4 MG/2ML IJ SOLN
INTRAMUSCULAR | Status: DC | PRN
  Administered 2018-08-15: 4 mg via INTRAVENOUS

## 2018-08-15 MED ORDER — ALBUTEROL SULFATE HFA 108 (90 BASE) MCG/ACT IN AERS: 2.0000 | INHALATION_SPRAY | Freq: Four times a day (QID) | RESPIRATORY_TRACT | Status: DC | PRN

## 2018-08-15 MED ORDER — TIZANIDINE HCL 2 MG PO TABS
2.0000 mg | ORAL_TABLET | Freq: Three times a day (TID) | ORAL | Status: DC | PRN
  Filled 2018-08-15: qty 1

## 2018-08-15 MED ORDER — OXYCODONE HCL 5 MG PO TABS: 5.0000 mg | ORAL_TABLET | Freq: Once | ORAL | Status: DC | PRN

## 2018-08-15 MED ORDER — SUGAMMADEX SODIUM 200 MG/2ML IV SOLN
INTRAVENOUS | Status: DC | PRN
  Administered 2018-08-15: 180 mg via INTRAVENOUS

## 2018-08-15 MED ORDER — IPRATROPIUM-ALBUTEROL 20-100 MCG/ACT IN AERS: 1.0000 | INHALATION_SPRAY | Freq: Two times a day (BID) | RESPIRATORY_TRACT | Status: DC

## 2018-08-15 MED ORDER — ACETAMINOPHEN 10 MG/ML IV SOLN
INTRAVENOUS | Status: DC | PRN
  Administered 2018-08-15: 1000 mg via INTRAVENOUS

## 2018-08-15 MED ORDER — IPRATROPIUM-ALBUTEROL 0.5-2.5 (3) MG/3ML IN SOLN
3.0000 mL | Freq: Four times a day (QID) | RESPIRATORY_TRACT | Status: DC
  Filled 2018-08-15: qty 3

## 2018-08-15 MED ORDER — BUPIVACAINE LIPOSOME 1.3 % IJ SUSP
INTRAMUSCULAR | Status: DC | PRN
  Administered 2018-08-15: 20 mL

## 2018-08-15 MED ORDER — INSULIN LISPRO 100 UNIT/ML (KWIKPEN): 15.0000 [IU] | PEN_INJECTOR | Freq: Every day | SUBCUTANEOUS | Status: DC

## 2018-08-15 MED ORDER — BUPIVACAINE HCL (PF) 0.5 % IJ SOLN
INTRAMUSCULAR | Status: AC
  Filled 2018-08-15: qty 30

## 2018-08-15 MED ORDER — SODIUM CHLORIDE 0.9 % IV SOLN
INTRAVENOUS | Status: DC | PRN
  Administered 2018-08-15: 30 ug/min via INTRAVENOUS

## 2018-08-15 MED ORDER — MOMETASONE FURO-FORMOTEROL FUM 200-5 MCG/ACT IN AERO
2.0000 | INHALATION_SPRAY | Freq: Two times a day (BID) | RESPIRATORY_TRACT | Status: DC
  Administered 2018-08-15 – 2018-08-16 (×3): 2 via RESPIRATORY_TRACT
  Filled 2018-08-15: qty 8.8

## 2018-08-15 MED ORDER — LACTATED RINGERS IV SOLN
INTRAVENOUS | Status: DC
  Administered 2018-08-15: 03:00:00 via INTRAVENOUS

## 2018-08-15 MED ORDER — LIDOCAINE HCL (CARDIAC) PF 100 MG/5ML IV SOSY
PREFILLED_SYRINGE | INTRAVENOUS | Status: DC | PRN
  Administered 2018-08-15: 60 mg via INTRAVENOUS

## 2018-08-15 MED ORDER — PROPOFOL 10 MG/ML IV BOLUS
INTRAVENOUS | Status: DC | PRN
  Administered 2018-08-15: 140 mg via INTRAVENOUS

## 2018-08-15 MED ORDER — OXYCODONE HCL 5 MG/5ML PO SOLN: 5.0000 mg | Freq: Once | ORAL | Status: DC | PRN

## 2018-08-15 MED ORDER — PREGABALIN 75 MG PO CAPS: 200.0000 mg | ORAL_CAPSULE | Freq: Three times a day (TID) | ORAL | Status: DC | PRN

## 2018-08-15 MED ORDER — TRIAMCINOLONE ACETONIDE 0.1 % EX CREA
1.0000 "application " | TOPICAL_CREAM | Freq: Two times a day (BID) | CUTANEOUS | Status: DC
  Administered 2018-08-15: 1 via TOPICAL
  Filled 2018-08-15: qty 15

## 2018-08-15 MED ORDER — FENTANYL CITRATE (PF) 100 MCG/2ML IJ SOLN
25.0000 ug | INTRAMUSCULAR | Status: DC | PRN
  Administered 2018-08-15 (×4): 25 ug via INTRAVENOUS

## 2018-08-15 MED ORDER — ALBUTEROL SULFATE HFA 108 (90 BASE) MCG/ACT IN AERS
INHALATION_SPRAY | RESPIRATORY_TRACT | Status: AC
  Filled 2018-08-15: qty 6.7

## 2018-08-15 MED ORDER — PROMETHAZINE HCL 25 MG/ML IJ SOLN: 6.2500 mg | INTRAMUSCULAR | Status: DC | PRN

## 2018-08-15 MED ORDER — IPRATROPIUM BROMIDE 0.02 % IN SOLN: 0.5000 mg | Freq: Four times a day (QID) | RESPIRATORY_TRACT | Status: DC | PRN

## 2018-08-15 MED ORDER — TRAZODONE HCL 100 MG PO TABS
100.0000 mg | ORAL_TABLET | Freq: Every day | ORAL | Status: DC
  Administered 2018-08-15: 100 mg via ORAL
  Filled 2018-08-15: qty 1

## 2018-08-15 MED ORDER — IPRATROPIUM BROMIDE HFA 17 MCG/ACT IN AERS: 2.0000 | INHALATION_SPRAY | Freq: Four times a day (QID) | RESPIRATORY_TRACT | Status: DC | PRN

## 2018-08-15 MED ORDER — IPRATROPIUM-ALBUTEROL 0.5-2.5 (3) MG/3ML IN SOLN: 3.0000 mL | Freq: Two times a day (BID) | RESPIRATORY_TRACT | Status: DC

## 2018-08-15 MED ORDER — GABAPENTIN 300 MG PO CAPS
300.0000 mg | ORAL_CAPSULE | Freq: Three times a day (TID) | ORAL | Status: DC
  Administered 2018-08-15 – 2018-08-16 (×4): 300 mg via ORAL
  Filled 2018-08-15 (×4): qty 1

## 2018-08-15 MED ORDER — SPACER/AERO CHAMBER MOUTHPIECE MISC: 1.0000 [IU] | Status: DC | PRN

## 2018-08-15 MED ORDER — NICOTINE 14 MG/24HR TD PT24
14.0000 mg | MEDICATED_PATCH | Freq: Every day | TRANSDERMAL | Status: DC
  Administered 2018-08-15 – 2018-08-16 (×2): 14 mg via TRANSDERMAL
  Filled 2018-08-15 (×2): qty 1

## 2018-08-15 MED ORDER — VENLAFAXINE HCL ER 75 MG PO CP24
300.0000 mg | ORAL_CAPSULE | Freq: Every day | ORAL | Status: DC
  Administered 2018-08-15 – 2018-08-16 (×2): 300 mg via ORAL
  Filled 2018-08-15 (×2): qty 4

## 2018-08-15 MED ORDER — FENTANYL CITRATE (PF) 100 MCG/2ML IJ SOLN
INTRAMUSCULAR | Status: AC
  Administered 2018-08-15: 25 ug via INTRAVENOUS
  Filled 2018-08-15: qty 2

## 2018-08-15 MED ORDER — INSULIN GLARGINE 100 UNIT/ML ~~LOC~~ SOLN
15.0000 [IU] | Freq: Every day | SUBCUTANEOUS | Status: DC
  Administered 2018-08-15: 15 [IU] via SUBCUTANEOUS
  Filled 2018-08-15 (×2): qty 0.15

## 2018-08-15 MED ORDER — LACTATED RINGERS IV SOLN
INTRAVENOUS | Status: DC | PRN
  Administered 2018-08-15: 01:00:00 via INTRAVENOUS

## 2018-08-15 MED ORDER — ALBUTEROL SULFATE HFA 108 (90 BASE) MCG/ACT IN AERS
INHALATION_SPRAY | RESPIRATORY_TRACT | Status: DC | PRN
  Administered 2018-08-15: 8 via RESPIRATORY_TRACT

## 2018-08-15 MED ORDER — METHOCARBAMOL 500 MG PO TABS
500.0000 mg | ORAL_TABLET | Freq: Three times a day (TID) | ORAL | Status: DC
  Administered 2018-08-15 – 2018-08-16 (×4): 500 mg via ORAL
  Filled 2018-08-15 (×6): qty 1

## 2018-08-15 MED ORDER — OXYCODONE HCL 5 MG PO TABS
5.0000 mg | ORAL_TABLET | ORAL | Status: DC | PRN
  Administered 2018-08-15 – 2018-08-16 (×4): 10 mg via ORAL
  Filled 2018-08-15 (×5): qty 2

## 2018-08-15 MED ORDER — INSULIN ASPART 100 UNIT/ML ~~LOC~~ SOLN
15.0000 [IU] | Freq: Every day | SUBCUTANEOUS | Status: DC
  Administered 2018-08-15: 15 [IU] via SUBCUTANEOUS
  Filled 2018-08-15: qty 1

## 2018-08-15 MED ORDER — BUPIVACAINE LIPOSOME 1.3 % IJ SUSP
INTRAMUSCULAR | Status: AC
  Filled 2018-08-15: qty 20

## 2018-08-15 MED ORDER — ENOXAPARIN SODIUM 40 MG/0.4ML ~~LOC~~ SOLN
40.0000 mg | SUBCUTANEOUS | Status: DC
  Administered 2018-08-16: 40 mg via SUBCUTANEOUS
  Filled 2018-08-15: qty 0.4

## 2018-08-15 MED ORDER — ATORVASTATIN CALCIUM 20 MG PO TABS
40.0000 mg | ORAL_TABLET | Freq: Every day | ORAL | Status: DC
  Administered 2018-08-15: 40 mg via ORAL
  Filled 2018-08-15: qty 2

## 2018-08-15 MED ORDER — HYDROMORPHONE HCL 1 MG/ML IJ SOLN
1.0000 mg | INTRAMUSCULAR | Status: DC | PRN
  Administered 2018-08-15 – 2018-08-16 (×7): 1 mg via INTRAVENOUS
  Filled 2018-08-15 (×7): qty 1

## 2018-08-15 MED ORDER — MIDAZOLAM HCL 2 MG/2ML IJ SOLN
INTRAMUSCULAR | Status: DC | PRN
  Administered 2018-08-15 (×2): 1 mg via INTRAVENOUS

## 2018-08-15 SURGICAL SUPPLY — 34 items
BLADE SURG 15 STRL LF DISP TIS (BLADE) ×1 IMPLANT
BLADE SURG 15 STRL SS (BLADE) ×1
CANISTER SUCT 1200ML W/VALVE (MISCELLANEOUS) ×2 IMPLANT
CHLORAPREP W/TINT 26ML (MISCELLANEOUS) ×2 IMPLANT
DERMABOND ADVANCED (GAUZE/BANDAGES/DRESSINGS)
DERMABOND ADVANCED .7 DNX12 (GAUZE/BANDAGES/DRESSINGS) IMPLANT
DRAPE LAPAROTOMY 100X77 ABD (DRAPES) ×2 IMPLANT
DRSG OPSITE POSTOP 4X8 (GAUZE/BANDAGES/DRESSINGS) ×2 IMPLANT
ELECT REM PT RETURN 9FT ADLT (ELECTROSURGICAL) ×2
ELECTRODE REM PT RTRN 9FT ADLT (ELECTROSURGICAL) ×1 IMPLANT
GLOVE BIO SURGEON STRL SZ 6.5 (GLOVE) ×6 IMPLANT
GLOVE BIOGEL PI IND STRL 7.0 (GLOVE) ×3 IMPLANT
GLOVE BIOGEL PI INDICATOR 7.0 (GLOVE) ×3
GOWN STRL REUS W/ TWL LRG LVL3 (GOWN DISPOSABLE) ×3 IMPLANT
GOWN STRL REUS W/TWL LRG LVL3 (GOWN DISPOSABLE) ×3
KIT TURNOVER KIT A (KITS) ×2 IMPLANT
LABEL OR SOLS (LABEL) IMPLANT
NDL SAFETY ECLIPSE 18X1.5 (NEEDLE) ×1 IMPLANT
NEEDLE HYPO 18GX1.5 SHARP (NEEDLE) ×1
NEEDLE HYPO 22GX1.5 SAFETY (NEEDLE) IMPLANT
NS IRRIG 500ML POUR BTL (IV SOLUTION) ×2 IMPLANT
PACK BASIN MAJOR ARMC (MISCELLANEOUS) ×2 IMPLANT
PACK BASIN MINOR ARMC (MISCELLANEOUS) IMPLANT
SLEEVE SCD COMPRESS THIGH MED (MISCELLANEOUS) ×2 IMPLANT
STAPLER SKIN PROX 35W (STAPLE) ×2 IMPLANT
SUT ETHIBOND NAB MO 7 #0 18IN (SUTURE) ×2 IMPLANT
SUT MNCRL 4-0 (SUTURE)
SUT MNCRL 4-0 27XMFL (SUTURE)
SUT VIC AB 3-0 SH 27 (SUTURE)
SUT VIC AB 3-0 SH 27X BRD (SUTURE) IMPLANT
SUTURE MNCRL 4-0 27XMF (SUTURE) IMPLANT
SYR 10ML LL (SYRINGE) ×2 IMPLANT
SYR 20CC LL (SYRINGE) ×2 IMPLANT
WATER STERILE IRR 1000ML POUR (IV SOLUTION) ×2 IMPLANT

## 2018-08-15 NOTE — Progress Notes (Signed)
PT Cancellation Note  Patient Details Name: Kelly Dillon MRN: [id number] DOB: [birth year]   Cancelled Treatment:    Reason Eval/Treat Not Completed: (Consult received and chart reviewed.  Patient status post surgical intervention with general anesthesia this date.  Per policy, will require new order post-op to initiate PT services.  Please place new order as medically appropriate.)  Bernadetta Roell H. Owens Shark, PT, DPT, NCS 08/15/18, 12:46 PM [phone]

## 2018-08-15 NOTE — Transfer of Care (Signed)
Immediate Anesthesia Transfer of Care Note  Patient: Kelly Dillon  Procedure(s) Performed: HERNIA REPAIR VENTRAL ADULT (N/A )  Patient Location: PACU  Anesthesia Type:General  Level of Consciousness: awake, alert , oriented and patient cooperative  Airway & Oxygen Therapy: Patient Spontanous Breathing and Patient connected to face mask oxygen  Post-op Assessment: Report given to RN and Post -op Vital signs reviewed and stable  Post vital signs: Reviewed and stable  Last Vitals:  Vitals Value Taken Time  BP 147/96 08/15/2018  2:41 AM  Temp    Pulse 95 08/15/2018  2:43 AM  Resp 21 08/15/2018  2:43 AM  SpO2 98 % 08/15/2018  2:43 AM  Vitals shown include unvalidated device data.  Last Pain:  Vitals:   08/14/18 2042  TempSrc:   PainSc: 8          Complications: No apparent anesthesia complications

## 2018-08-15 NOTE — Progress Notes (Signed)
Per Md diet order added.

## 2018-08-15 NOTE — Progress Notes (Signed)
Subjective:  CC:  Kelly Dillon is a 57 y.o. female  Hospital stay day 0, Day of Surgery open recurrent ventral hernia repair  HPI: No issues overnight.  Feels much better today and pain controlled.  ROS:  A 5 point review of systems was performed and pertinent positives and negatives noted in HPI.   Objective:      Temp:  [97.4 F (36.3 C)-98.2 F (36.8 C)] 97.9 F (36.6 C) (09/25 1302) Pulse Rate:  [64-111] 66 (09/25 1302) Resp:  [16-25] 17 (09/25 1302) BP: (102-158)/(73-105) 111/73 (09/25 1302) SpO2:  [89 %-99 %] 94 % (09/25 1302) Weight:  [82.6 kg] 82.6 kg (09/24 1911)     Height: 4\' 11"  (149.9 cm) Weight: 82.6 kg BMI (Calculated): 36.74   Intake/Output this shift:  Total I/O In: 120 [P.O.:120] Out: 350 [Urine:350]       Constitutional :  alert, cooperative, appears stated age and no distress  Respiratory:  clear to auscultation bilaterally  Cardiovascular:  regular rate and rhythm  Gastrointestinal: soft, minimal tenderness overlying incision, palpable hernia noted adjacent to incision, soft, easily reducible..   Skin: Cool and moist.   Psychiatric: Normal affect, non-agitated, not confused       LABS:  CMP Latest Ref Rng & Units 08/15/2018 08/14/2018 02/21/2018  Glucose 70 - 99 mg/dL - 130(H) 101(H)  BUN 6 - 20 mg/dL - 16 12  Creatinine 0.44 - 1.00 mg/dL 0.55 0.72 0.76  Sodium 135 - 145 mmol/L - 138 142  Potassium 3.5 - 5.1 mmol/L - 4.0 4.7  Chloride 98 - 111 mmol/L - 104 102  CO2 22 - 32 mmol/L - 28 -  Calcium 8.9 - 10.3 mg/dL - 8.9 9.4  Total Protein 6.5 - 8.1 g/dL - 8.0 7.4  Total Bilirubin 0.3 - 1.2 mg/dL - 0.6 0.3  Alkaline Phos 38 - 126 U/L - 118 132(H)  AST 15 - 41 U/L - 47(H) 25  ALT 0 - 44 U/L - 36 -   CBC Latest Ref Rng & Units 08/15/2018 08/14/2018 05/06/2017  WBC 3.6 - 11.0 K/uL 8.9 10.3 11.1(H)  Hemoglobin 12.0 - 16.0 g/dL 13.7 14.8 15.8  Hematocrit 35.0 - 47.0 % 39.0 41.7 46.4  Platelets 150 - 440 K/uL 168 125(L) 207     RADS: n/a Assessment:   S/p partial repair of recurrent ventral hernia as noted in op report.  Explained to patient how only superficial defect was closed so she still has hernia, but glad to see pain has much improved.  Will ADAT, and once pain is controlled with oral meds, will refer her back to [place] [facility] for pain clinic and possible plastics referral again for more definitive repair of her complex recurrent ventral hernia.  Pt verbalized understanding.

## 2018-08-15 NOTE — Anesthesia Post-op Follow-up Note (Signed)
Anesthesia QCDR form completed.        

## 2018-08-15 NOTE — Anesthesia Procedure Notes (Signed)
Procedure Name: Intubation Date/Time: 08/15/2018 12:44 AM Performed by: Lendon Colonel, CRNA Pre-anesthesia Checklist: Patient identified, Patient being monitored, Timeout performed, Emergency Drugs available and Suction available Patient Re-evaluated:Patient Re-evaluated prior to induction Oxygen Delivery Method: Circle system utilized Preoxygenation: Pre-oxygenation with 100% oxygen Induction Type: IV induction Ventilation: Mask ventilation without difficulty Laryngoscope Size: Miller and 2 Grade View: Grade II Tube type: Oral Tube size: 7.0 mm Number of attempts: 1 Airway Equipment and Method: Stylet Placement Confirmation: ETT inserted through vocal cords under direct vision,  positive ETCO2 and breath sounds checked- equal and bilateral Secured at: 0 cm Tube secured with: Tape Dental Injury: Teeth and Oropharynx as per pre-operative assessment

## 2018-08-15 NOTE — Plan of Care (Signed)
Pain has been an issue with the patient today. Had a bowel movement. Voiding without difficulties. No falls. The patient has gotten out of bed with one person assist. Tolerating diet changed.  Problem: Education: Goal: Knowledge of General Education information will improve Description Including pain rating scale, medication(s)/side effects and non-pharmacologic comfort measures Outcome: Progressing   Problem: Health Behavior/Discharge Planning: Goal: Ability to manage health-related needs will improve Outcome: Progressing   Problem: Clinical Measurements: Goal: Ability to maintain clinical measurements within normal limits will improve Outcome: Progressing Goal: Will remain free from infection Outcome: Progressing Goal: Diagnostic test results will improve Outcome: Progressing Goal: Respiratory complications will improve Outcome: Progressing Goal: Cardiovascular complication will be avoided Outcome: Progressing   Problem: Activity: Goal: Risk for activity intolerance will decrease Outcome: Progressing   Problem: Nutrition: Goal: Adequate nutrition will be maintained Outcome: Progressing   Problem: Coping: Goal: Level of anxiety will decrease Outcome: Progressing   Problem: Elimination: Goal: Will not experience complications related to bowel motility Outcome: Progressing Goal: Will not experience complications related to urinary retention Outcome: Progressing   Problem: Pain Managment: Goal: General experience of comfort will improve Outcome: Progressing   Problem: Safety: Goal: Ability to remain free from injury will improve Outcome: Progressing   Problem: Skin Integrity: Goal: Risk for impaired skin integrity will decrease Outcome: Progressing

## 2018-08-15 NOTE — Anesthesia Preprocedure Evaluation (Signed)
Anesthesia Evaluation  Patient identified by MRN, date of birth, ID band Patient awake    Reviewed: Allergy & Precautions, H&P , NPO status , Patient's Chart, lab work & pertinent test results  Airway Mallampati: II  TM Distance: >3 FB Neck ROM: full    Dental  (+) Chipped, Missing, Poor Dentition   Pulmonary neg pulmonary ROS, shortness of breath, COPD,  oxygen dependent, former smoker,     + wheezing (scattered wheezes)      Cardiovascular hypertension, negative cardio ROS   Rhythm:regular Rate:Normal     Neuro/Psych PSYCHIATRIC DISORDERS Depression  Neuromuscular disease negative neurological ROS  negative psych ROS   GI/Hepatic negative GI ROS, Neg liver ROS,   Endo/Other  negative endocrine ROSdiabetes  Renal/GU      Musculoskeletal  (+) Arthritis , Fibromyalgia -  Abdominal   Peds  Hematology negative hematology ROS (+)   Anesthesia Other Findings Past Medical History: No date: COPD (chronic obstructive pulmonary disease) ([facility]) No date: Diabetes mellitus without complication ([facility]) No date: Hypertension  Past Surgical History: No date: APPENDECTOMY No date: HERNIA REPAIR No date: TUBAL LIGATION  BMI    Body Mass Index:  36.76 kg/m      Reproductive/Obstetrics negative OB ROS                             Anesthesia Physical Anesthesia Plan  ASA: III  Anesthesia Plan: General ETT   Post-op Pain Management:    Induction:   PONV Risk Score and Plan: Ondansetron and Dexamethasone  Airway Management Planned:   Additional Equipment:   Intra-op Plan:   Post-operative Plan:   Informed Consent: I have reviewed the patients History and Physical, chart, labs and discussed the procedure including the risks, benefits and alternatives for the proposed anesthesia with the patient or authorized representative who has indicated his/her understanding and acceptance.   Dental  Advisory Given  Plan Discussed with: Anesthesiologist, CRNA and Surgeon  Anesthesia Plan Comments:         Anesthesia Quick Evaluation

## 2018-08-15 NOTE — Op Note (Signed)
Preoperative diagnosis: recurrent ventral hernia Postoperative diagnosis: same  Procedure:  Open recurrent ventral hernia repair Anesthesia: LMA  Surgeon: Benjamine Sprague  Wound Classification: Clean  Specimen: none  Complications: None  Estimated Blood Loss: 30  Indications:see HPI  Findings: 1. 3cm x 2.5cm defect noted in superficial transversalis layer on left lateral aspect, unable to visualize deeper defect due to body habitus  2. Tension free repair achieved with primary closure  3. Adequate hemostasis  Description of procedure: The patient was brought to the operating room and general anesthesia was induced. A time-out was completed verifying correct patient, procedure, site, positioning, and implant(s) and/or special equipment prior to beginning this procedure. Antibiotics were administered prior to making the incision. SCDs placed. The anterior abdominal wall was prepped and draped in the standard sterile fashion.   Hernia was fully reduced prior to incision with very minimal pressure overlying it.  An incision was made in the LLQ over the hernia site Dissection carried down through obliques and down to the transversalis fascia.  Extensive exploration of the superficial transversalis fascia noted a 3 cm x 2-1/2 cm defect that seem to be tunneling towards the peritoneal cavity.  Despite a rather large transverse incision, adequate visualization was extremely difficult throughout the entire procedure due to her extremely thick abdominal wall.  The noted defect was able to be approximated very easily with zero tension.  Due to the limited visibility and lack of appropriate instruments to obtain further dissection to look for the deeper fascial defect noted on CT scan, decision was made at this point to close the visible defect primarily and reassess her pain scale for this non incarcerated chronic hernia.  Primary repair was chosen because dissection to place a underlay mesh was impossible  with the current instruments and visible field.    0 Ethibond sutures in an interrupted fashion were used to easily approximate the fascial defect and close it completely.  One last inspection of all visible area within the limited field-of-view again noted no other obvious defects.  The external oblique fascia was then closed with running 0 Ethibond suture.  The entire operative field was then infused with Exparel prior to closing the skin incision with staples.  Adequate hemostasis was noted prior to closure of skin as well as the fascial layers.  The hernia did not recur throughout the entire procedure.  The wound was then dressed with 4 x 4 and tape.  Patient was then successfully awakened and transferred to PACU in stable condition.  At the end of the procedure sponge and instrument counts were correct

## 2018-08-16 ENCOUNTER — Other Ambulatory Visit: Payer: Self-pay

## 2018-08-16 ENCOUNTER — Encounter: Payer: Self-pay | Admitting: Emergency Medicine

## 2018-08-16 ENCOUNTER — Inpatient Hospital Stay
Admission: EM | Admit: 2018-08-16 | Discharge: 2018-08-19 | DRG: 355 | Disposition: A | Discharge status: Home / Self Care | Payer: Medicare (Managed Care) | Attending: Surgery | Admitting: Surgery

## 2018-08-16 DIAGNOSIS — E119 Type 2 diabetes mellitus without complications: Secondary | ICD-10-CM | POA: Diagnosis present

## 2018-08-16 DIAGNOSIS — Z5331 Laparoscopic surgical procedure converted to open procedure: Secondary | ICD-10-CM

## 2018-08-16 DIAGNOSIS — Z6836 Body mass index (BMI) 36.0-36.9, adult: Secondary | ICD-10-CM

## 2018-08-16 DIAGNOSIS — I10 Essential (primary) hypertension: Secondary | ICD-10-CM | POA: Diagnosis present

## 2018-08-16 DIAGNOSIS — J449 Chronic obstructive pulmonary disease, unspecified: Secondary | ICD-10-CM | POA: Diagnosis present

## 2018-08-16 DIAGNOSIS — Z794 Long term (current) use of insulin: Secondary | ICD-10-CM

## 2018-08-16 DIAGNOSIS — R109 Unspecified abdominal pain: Secondary | ICD-10-CM | POA: Diagnosis present

## 2018-08-16 DIAGNOSIS — K432 Incisional hernia without obstruction or gangrene: Principal | ICD-10-CM | POA: Diagnosis present

## 2018-08-16 DIAGNOSIS — R1084 Generalized abdominal pain: Secondary | ICD-10-CM

## 2018-08-16 DIAGNOSIS — Z87891 Personal history of nicotine dependence: Secondary | ICD-10-CM

## 2018-08-16 DIAGNOSIS — K56699 Other intestinal obstruction unspecified as to partial versus complete obstruction: Secondary | ICD-10-CM

## 2018-08-16 DIAGNOSIS — R1030 Lower abdominal pain, unspecified: Secondary | ICD-10-CM

## 2018-08-16 DIAGNOSIS — G894 Chronic pain syndrome: Secondary | ICD-10-CM | POA: Diagnosis present

## 2018-08-16 LAB — CBC
HEMATOCRIT: 41.3 % (ref 35.0–47.0)
Hemoglobin: 13.9 g/dL (ref 12.0–16.0)
MCH: 31.3 pg (ref 26.0–34.0)
MCHC: 33.7 g/dL (ref 32.0–36.0)
MCV: 92.9 fL (ref 80.0–100.0)
Platelets: 168 10*3/uL (ref 150–440)
RBC: 4.45 MIL/uL (ref 3.80–5.20)
RDW: 13.5 % (ref 11.5–14.5)
WBC: 10.4 10*3/uL (ref 3.6–11.0)

## 2018-08-16 LAB — CBC WITH DIFFERENTIAL/PLATELET
BASOS ABS: 0.1 10*3/uL (ref 0–0.1)
BASOS PCT: 1 %
EOS ABS: 0.1 10*3/uL (ref 0–0.7)
Eosinophils Relative: 1 %
HEMATOCRIT: 38.6 % (ref 35.0–47.0)
HEMOGLOBIN: 13.7 g/dL (ref 12.0–16.0)
Lymphocytes Relative: 29 %
Lymphs Abs: 2.8 10*3/uL (ref 1.0–3.6)
MCH: 32.6 pg (ref 26.0–34.0)
MCHC: 35.6 g/dL (ref 32.0–36.0)
MCV: 91.6 fL (ref 80.0–100.0)
Monocytes Absolute: 0.5 10*3/uL (ref 0.2–0.9)
Monocytes Relative: 6 %
NEUTROS ABS: 6.3 10*3/uL (ref 1.4–6.5)
NEUTROS PCT: 63 %
Platelets: 164 10*3/uL (ref 150–440)
RBC: 4.21 MIL/uL (ref 3.80–5.20)
RDW: 13.9 % (ref 11.5–14.5)
WBC: 9.7 10*3/uL (ref 3.6–11.0)

## 2018-08-16 LAB — BASIC METABOLIC PANEL
ANION GAP: 9 (ref 5–15)
BUN: 13 mg/dL (ref 6–20)
CHLORIDE: 99 mmol/L (ref 98–111)
CO2: 30 mmol/L (ref 22–32)
Calcium: 9 mg/dL (ref 8.9–10.3)
Creatinine, Ser: 0.61 mg/dL (ref 0.44–1.00)
GFR calc Af Amer: >60 mL/min (ref >60)
GFR calc non Af Amer: >60 mL/min (ref >60)
GLUCOSE: 160 mg/dL — AB (ref 70–99)
POTASSIUM: 4.1 mmol/L (ref 3.5–5.1)
Sodium: 138 mmol/L (ref 135–145)

## 2018-08-16 LAB — GLUCOSE, CAPILLARY
Glucose-Capillary: 135 mg/dL — ABNORMAL HIGH (ref 70–99)
Glucose-Capillary: 116 mg/dL — ABNORMAL HIGH (ref 70–99)

## 2018-08-16 MED ORDER — ACETAMINOPHEN 325 MG PO TABS: 650.0000 mg | ORAL_TABLET | Freq: Four times a day (QID) | ORAL | Status: DC | PRN

## 2018-08-16 MED ORDER — MORPHINE SULFATE (PF) 4 MG/ML IV SOLN
INTRAVENOUS | Status: AC
  Filled 2018-08-16: qty 1

## 2018-08-16 MED ORDER — MORPHINE SULFATE (PF) 4 MG/ML IV SOLN
4.0000 mg | Freq: Once | INTRAVENOUS | Status: AC
  Administered 2018-08-16: 4 mg via INTRAVENOUS

## 2018-08-16 MED ORDER — NICOTINE 14 MG/24HR TD PT24: 14.0000 mg | MEDICATED_PATCH | Freq: Every day | TRANSDERMAL | 0 refills | Status: DC

## 2018-08-16 MED ORDER — SODIUM CHLORIDE 0.9 % IV SOLN
Freq: Once | INTRAVENOUS | Status: AC
  Administered 2018-08-16: via INTRAVENOUS

## 2018-08-16 MED ORDER — CELECOXIB 200 MG PO CAPS
200.0000 mg | ORAL_CAPSULE | Freq: Every day | ORAL | Status: DC
  Administered 2018-08-16: 200 mg via ORAL
  Filled 2018-08-16: qty 1

## 2018-08-16 MED ORDER — ONDANSETRON HCL 4 MG/2ML IJ SOLN
INTRAMUSCULAR | Status: AC
  Filled 2018-08-16: qty 2

## 2018-08-16 MED ORDER — DOCUSATE SODIUM 100 MG PO CAPS: 100.0000 mg | ORAL_CAPSULE | Freq: Two times a day (BID) | ORAL | 0 refills | Status: AC | PRN

## 2018-08-16 MED ORDER — CELECOXIB 200 MG PO CAPS: 200.0000 mg | ORAL_CAPSULE | Freq: Every day | ORAL | 0 refills | Status: AC

## 2018-08-16 MED ORDER — ONDANSETRON HCL 4 MG/2ML IJ SOLN
4.0000 mg | Freq: Once | INTRAMUSCULAR | Status: AC
  Administered 2018-08-16: 4 mg via INTRAVENOUS

## 2018-08-16 MED ORDER — ACETAMINOPHEN 325 MG PO TABS: 650.0000 mg | ORAL_TABLET | Freq: Four times a day (QID) | ORAL | 0 refills | Status: AC | PRN

## 2018-08-16 MED ORDER — HYDROCODONE-ACETAMINOPHEN 7.5-325 MG PO TABS: 1.0000 | ORAL_TABLET | Freq: Four times a day (QID) | ORAL | 0 refills | Status: AC | PRN

## 2018-08-16 MED ORDER — HYDROCODONE-ACETAMINOPHEN 7.5-325 MG PO TABS
1.0000 | ORAL_TABLET | Freq: Four times a day (QID) | ORAL | Status: DC | PRN
  Administered 2018-08-16: 1 via ORAL
  Filled 2018-08-16: qty 1

## 2018-08-16 NOTE — ED Triage Notes (Signed)
Pt presents to ED with severe left sided abd pain. Hernia repair performed yesterday and pt discharged today. Pt crying out in pain and states she has been in unbearable pain since her surgery.  Small amount of drainage noted to dressing.

## 2018-08-16 NOTE — ED Notes (Signed)
Last ate spaghetti around 5:30pm

## 2018-08-16 NOTE — Progress Notes (Signed)
Discharge instructions reviewed with patient including followup visits and new medications.  Understanding was verbalized and all questions were answered.  IV removed without complication; patient tolerated well.  Patient discharged home via wheelchair in stable condition escorted by volunteer staff.  

## 2018-08-16 NOTE — ED Notes (Signed)
Pain inside at incision area. Pt states it feels like it is tearing out

## 2018-08-16 NOTE — Discharge Instructions (Signed)
-   tylenol and advil as needed for discomfort.  Please alternate between the two every four hours as needed for pain.   - Use narcotics, if prescribed, only when tylenol and motrin is not enough to control pain. - 325-650mg  every 8hrs to max of 4000mg /24hrs (including the 325mg  in every norco dose) for the tylenol.   - Advil up to 800mg  per dose every 8hrs as needed for pain.   -Keep outer dressing on until 5 days after procedure, then ok to remove.  Ok to shower now with dressing still in place.  No baths or swimming until seen in office.

## 2018-08-16 NOTE — Progress Notes (Signed)
   08/16/18 1000  Clinical Encounter Type  Visited With Patient  Visit Type Initial;Spiritual support  Recommendations Continued prayer.  Spiritual Encounters  Spiritual Needs Emotional;Prayer   Chaplain met patient prior to discharge and listened actively to her concerns about future surgery and her current recovery. Chaplain offered emotional support.  Patient seeks continued prayers for her health.

## 2018-08-16 NOTE — Discharge Summary (Signed)
Physician Discharge Summary  Patient ID: Kelly Dillon MRN: [id number] DOB/AGE: [birth year] 57 y.o.  Admit date: 08/14/2018 Discharge date: 08/16/2018  Admission Diagnoses: recurrent ventral hernia-incarcerated  Discharge Diagnoses:  Recurrent ventral hernia-reducible  Discharged Condition: good  Hospital Course: Consulted in the emergency department for possible incarcerated and/or strangulated recurrent ventral hernia.  In the emergency department I was able to partially reduce the hernia with some distraction but unable to fully assess if it was fully reduced due to body habitus and patient's extreme discomfort during the procedure.  This was despite multiple doses of Dilaudid prior to attempt at reduction.  At this time we had an extensive discussion about emergent repair of this recurrent ventral hernia and the likelihood that I will not be able to do a adequate repair due to the emergent nature of the procedure, her body habitus and a limited equipment available in the operating room.  In addition to the technical difficulties, her history of diabetes and current smoking history places her at an extremely high risk for recurrence and this was also expanded to as well.  Due to the continued pain and discomfort I still recommended an attempt at repair despite the limitations listed above.  Patient fully understood and agreed to proceed with the repair.  Please see op note for details regarding the procedure itself.  Postop patient states pain has been very well controlled despite still having a persistent hernia.  However the hernia is easily reducible at bedside and there is no tenderness with multiple reductions throughout her hospital stay..  At the time of discharge patient was voiding tolerating a regular diet and pain was controlled with oral meds.  We discussed how she will need follow-up with a hernia specialist that can eventually do a more definitive repair as previously discussed according to  her medical records.  I will follow-up with her in the short-term as far as recovery from the procedure I performed in 1 week for a wound check and staple removal.  Consults: None  Discharge Exam: Blood pressure 115/78, pulse 70, temperature 97.6 F (36.4 C), temperature source Oral, resp. rate 20, height 4\' 11"  (1.499 m), weight 82.6 kg, SpO2 98 %. General appearance: alert, appears stated age and no distress GI: soft, non-tender; bowel sounds normal; no masses,  no organomegaly.  LLQ incision c/d/i.  Dressing in place with minimal bloody discharge.  Soft, palpable hernia that is easily reducible at bedside with minimal discomfort.  Disposition:  Discharge disposition: 01-Home or Self Care       Discharge Instructions    Discharge patient   Complete by:  As directed    Discharge disposition:  01-Home or Self Care   Discharge patient date:  08/16/2018     Allergies as of 08/16/2018   No Known Allergies     Medication List    TAKE these medications   acetaminophen 325 MG tablet Commonly known as:  TYLENOL Take 2 tablets (650 mg total) by mouth every 6 (six) hours as needed for up to 10 days for moderate pain or fever.   ADVAIR DISKUS 250-50 MCG/DOSE Aepb Generic drug:  Fluticasone-Salmeterol Inhale 1 puff into the lungs 2 (two) times daily.   albuterol 108 (90 Base) MCG/ACT inhaler Commonly known as:  PROVENTIL HFA;VENTOLIN HFA Inhale 2 puffs into the lungs every 6 (six) hours as needed for wheezing or shortness of breath.   albuterol (2.5 MG/3ML) 0.083% nebulizer solution Commonly known as:  PROVENTIL Inhale 3 mLs into the lungs  every 6 (six) hours as needed.   atorvastatin 40 MG tablet Commonly known as:  LIPITOR Take 1 tablet by mouth daily.   ATROVENT HFA 17 MCG/ACT inhaler Generic drug:  ipratropium Inhale 2 puffs into the lungs every 6 (six) hours as needed for wheezing.   celecoxib 200 MG capsule Commonly known as:  CELEBREX Take 1 capsule (200 mg total)  by mouth daily for 10 days.   docusate sodium 100 MG capsule Commonly known as:  COLACE Take 1 capsule (100 mg total) by mouth 2 (two) times daily as needed for up to 10 days for mild constipation.   gabapentin 300 MG capsule Commonly known as:  NEURONTIN Take 300 mg by mouth 3 (three) times daily.   HYDROcodone-acetaminophen 7.5-325 MG tablet Commonly known as:  NORCO Take 1 tablet by mouth every 6 (six) hours as needed for up to 7 days for severe pain.   insulin glargine 100 unit/mL Sopn Commonly known as:  LANTUS Inject 0.15 mLs (15 Units total) into the skin at bedtime.   insulin lispro 100 UNIT/ML KiwkPen Commonly known as:  HUMALOG Inject 0.15 mLs (15 Units total) into the skin daily with breakfast.   ipratropium-albuterol 0.5-2.5 (3) MG/3ML Soln Commonly known as:  DUONEB Take 3 mLs by nebulization QID.   COMBIVENT RESPIMAT 20-100 MCG/ACT Aers respimat Generic drug:  Ipratropium-Albuterol Inhale 1 puff into the lungs 2 (two) times daily.   LYRICA 200 MG capsule Generic drug:  pregabalin Take 1 capsule by mouth 3 (three) times daily as needed.   metFORMIN 1000 MG tablet Commonly known as:  GLUCOPHAGE Take 1 tablet by mouth 2 (two) times daily.   methocarbamol 500 MG tablet Commonly known as:  ROBAXIN Take 500 mg by mouth 3 (three) times daily.   nicotine 14 mg/24hr patch Commonly known as:  NICODERM CQ - dosed in mg/24 hours Place 1 patch (14 mg total) onto the skin daily.   omeprazole 20 MG capsule Commonly known as:  PRILOSEC Take 20 mg by mouth daily.   oxybutynin 5 MG tablet Commonly known as:  DITROPAN Take 5 mg by mouth 2 (two) times daily.   Spacer/Aero Chamber Mouthpiece Misc 1 Units by Does not apply route every 4 (four) hours as needed (wheezing).   tiZANidine 2 MG tablet Commonly known as:  ZANAFLEX Take 1 tablet by mouth every 8 (eight) hours as needed for muscle spasms.   traZODone 100 MG tablet Commonly known as:  DESYREL Take 100 mg  by mouth at bedtime.   triamcinolone cream 0.1 % Commonly known as:  KENALOG Apply 1 application topically 2 (two) times daily.   venlafaxine XR 150 MG 24 hr capsule Commonly known as:  EFFEXOR-XR Take 2 capsules by mouth daily.      Follow-up Information    Jael Waldorf, DO Follow up in 1 week(s).   Specialty:  Surgery Why:  for staple removal Contact information: [address] [place] [address] [phone]            Total time spent arranging discharge was >52min. Signed: Benjamine Sprague 08/16/2018, 9:18 AM

## 2018-08-16 NOTE — Anesthesia Postprocedure Evaluation (Signed)
Anesthesia Post Note  Patient: Kelly Dillon  Procedure(s) Performed: HERNIA REPAIR VENTRAL ADULT (N/A )  Patient location during evaluation: PACU Anesthesia Type: General Level of consciousness: awake and alert Pain management: pain level controlled Vital Signs Assessment: post-procedure vital signs reviewed and stable Respiratory status: spontaneous breathing, nonlabored ventilation, respiratory function stable and patient connected to nasal cannula oxygen Cardiovascular status: blood pressure returned to baseline and stable Postop Assessment: no apparent nausea or vomiting Anesthetic complications: no     Last Vitals:  Vitals:   08/16/18 0518 08/16/18 0827  BP: 115/78   Pulse: 70   Resp: 20   Temp: 36.4 C   SpO2: 97% 98%    Last Pain:  Vitals:   08/16/18 0735  TempSrc:   PainSc: [place]

## 2018-08-16 NOTE — ED Provider Notes (Addendum)
[facility] Emergency Department Provider Note   First MD Initiated Contact with Patient 08/16/18 2345     (approximate)  I have reviewed the triage vital signs and the nursing notes.   HISTORY  Chief Complaint Abdominal Pain and Post-op Problem    HPI Kelly Dillon is a 57 y.o. female with below list of chronic medical conditions presents to the emergency department following hernia repair at 2 AM this morning with generalized abdominal pain with associated nausea.  Patient denies any fever or vomiting.  Patient states that current pain score is 10 out of 10   Past Medical History:  Diagnosis Date  . COPD (chronic obstructive pulmonary disease) ([place])   . Diabetes mellitus without complication ([place])   . Hypertension     Patient Active Problem List   Diagnosis Date Noted  . Abdominal pain 08/17/2018  . Ventral hernia, recurrent 08/15/2018  . Chronic generalized abdominal pain (Primary Area of Pain) 02/21/2018  . Chronic ankle pain, bilateral (Secondary Area of Pain) (L>R) 02/21/2018  . Chronic pain of both knees [facility] Area of Pain) (L>R) 02/21/2018  . Chronic bilateral low back pain with bilateral sciatica (Fourth Area of Pain) (L>R) 02/21/2018  . Chronic neck pain(midline) 02/21/2018  . Chronic pain of both shoulders(L>R) 02/21/2018  . Chronic upper extremity pain (L>R) 02/21/2018  . Chronic pain of both lower extremities (L>R) 02/21/2018  . Chronic pain syndrome 02/21/2018  . Long term current use of opiate analgesic 02/21/2018  . Pharmacologic therapy 02/21/2018  . Disorder of skeletal system 02/21/2018  . Problems influencing health status 02/21/2018  . Morbid obesity with BMI of 40.0-44.9, adult ([place]) 09/28/2017  . Shortness of breath 08/28/2017  . High risk medication use 06/15/2017  . Multiple joint pain 01/24/2017  . Hernia of abdominal wall 01/12/2017  . Sacroiliitis ([place]) 10/06/2016  . Colon polyp 06/11/2016  . BMI 39.0-39.9,adult  02/18/2016  . Excessive daytime sleepiness 11/11/2015  . Avascular necrosis of bones of both hips ([place]) 04/20/2015  . Mixed stress and urge urinary incontinence 12/09/2014  . Controlled type 2 diabetes mellitus without complication ([place]) 76/19/5093  . Chronic, continuous use of opioids 03/13/2013  . DDD (degenerative disc disease), lumbar 03/13/2013  . Suicide attempt ([place]) 03/13/2013  . Recurrent ventral hernia 08/28/2012  . Moderate COPD (chronic obstructive pulmonary disease) ([place]) 07/11/2012  . Tobacco abuse 03/01/2012  . Depression, major, recurrent, moderate ([place]) 07/21/2011  . Diverticulosis 07/21/2011  . Fibromyalgia 07/21/2011    Past Surgical History:  Procedure Laterality Date  . APPENDECTOMY    . HERNIA REPAIR    . TUBAL LIGATION    . VENTRAL HERNIA REPAIR N/A 08/15/2018   Procedure: HERNIA REPAIR VENTRAL ADULT;  Surgeon: Benjamine Sprague, DO;  Location: [facility] ORS;  Service: General;  Laterality: N/A;    Prior to Admission medications   Medication Sig Start Date End Date Taking? Authorizing Provider  acetaminophen (TYLENOL) 325 MG tablet Take 2 tablets (650 mg total) by mouth every 6 (six) hours as needed for up to 10 days for moderate pain or fever. 08/16/18 08/26/18 Yes Sakai, Isami, DO  ADVAIR DISKUS 250-50 MCG/DOSE AEPB Inhale 1 puff into the lungs 2 (two) times daily. 01/19/17  Yes [provider]  albuterol (PROVENTIL HFA;VENTOLIN HFA) 108 (90 Base) MCG/ACT inhaler Inhale 2 puffs into the lungs every 6 (six) hours as needed for wheezing or shortness of breath. 03/28/17  Yes Darel Hong, MD  albuterol (PROVENTIL) (2.5 MG/3ML) 0.083% nebulizer solution Inhale 3  mLs into the lungs every 6 (six) hours as needed. 08/04/18  Yes [provider]  atorvastatin (LIPITOR) 40 MG tablet Take 1 tablet by mouth daily. 11/07/17 11/07/18 Yes [provider]  ATROVENT HFA 17 MCG/ACT inhaler Inhale 2 puffs into the lungs every 6 (six) hours as needed for wheezing.   08/04/18  Yes [provider]  celecoxib (CELEBREX) 200 MG capsule Take 1 capsule (200 mg total) by mouth daily for 10 days. 08/16/18 08/26/18 Yes Sakai, Isami, DO  COMBIVENT RESPIMAT 20-100 MCG/ACT AERS respimat Inhale 1 puff into the lungs 2 (two) times daily.  08/04/18  Yes [provider]  docusate sodium (COLACE) 100 MG capsule Take 1 capsule (100 mg total) by mouth 2 (two) times daily as needed for up to 10 days for mild constipation. 08/16/18 08/26/18 Yes Sakai, Isami, DO  gabapentin (NEURONTIN) 300 MG capsule Take 300 mg by mouth 3 (three) times daily. 07/05/18  Yes [provider]  HYDROcodone-acetaminophen (NORCO) 7.5-325 MG tablet Take 1 tablet by mouth every 6 (six) hours as needed for up to 7 days for severe pain. 08/16/18 08/23/18 Yes Sakai, Isami, DO  insulin glargine (LANTUS) 100 unit/mL SOPN Inject 0.15 mLs (15 Units total) into the skin at bedtime. 04/01/17  Yes Sudini, Alveta Heimlich, MD  insulin lispro (HUMALOG KWIKPEN) 100 UNIT/ML KiwkPen Inject 0.15 mLs (15 Units total) into the skin daily with breakfast. 04/01/17  Yes Sudini, Alveta Heimlich, MD  ipratropium-albuterol (DUONEB) 0.5-2.5 (3) MG/3ML SOLN Take 3 mLs by nebulization QID. 04/01/17  Yes Sudini, Srikar, MD  LYRICA 200 MG capsule Take 1 capsule by mouth 3 (three) times daily as needed. 02/22/17  Yes [provider]  metFORMIN (GLUCOPHAGE) 1000 MG tablet Take 1 tablet by mouth 2 (two) times daily. 01/12/17  Yes [provider]  methocarbamol (ROBAXIN) 500 MG tablet Take 500 mg by mouth 3 (three) times daily. 08/04/18  Yes [provider]  nicotine (NICODERM CQ - DOSED IN MG/24 HOURS) 14 mg/24hr patch Place 1 patch (14 mg total) onto the skin daily. 08/16/18  Yes Sakai, Isami, DO  omeprazole (PRILOSEC) 20 MG capsule Take 20 mg by mouth daily. 07/05/18  Yes [provider]  oxybutynin (DITROPAN) 5 MG tablet Take 5 mg by mouth 2 (two) times daily.  10/09/17 10/09/18 Yes [provider]    Spacer/Aero Chamber Mouthpiece MISC 1 Units by Does not apply route every 4 (four) hours as needed (wheezing). 03/28/17  Yes Darel Hong, MD  tiZANidine (ZANAFLEX) 2 MG tablet Take 1 tablet by mouth every 8 (eight) hours as needed for muscle spasms.  08/24/17  Yes [provider]  traZODone (DESYREL) 100 MG tablet Take 100 mg by mouth at bedtime. 07/05/18  Yes [provider]  triamcinolone cream (KENALOG) 0.1 % Apply 1 application topically 2 (two) times daily. 08/04/18  Yes [provider]  venlafaxine XR (EFFEXOR-XR) 150 MG 24 hr capsule Take 2 capsules by mouth daily.  03/19/17  Yes [provider]    Allergies No known drug allergies No family history on file.  Social History Social History   Tobacco Use  . Smoking status: Former Smoker    Types: Cigarettes    Last attempt to quit: 02/19/2017    Years since quitting: 1.4  . Smokeless tobacco: Never Used  Substance Use Topics  . Alcohol use: No  . Drug use: No    Review of Systems Constitutional: No fever/chills Eyes: No visual changes. ENT: No sore throat. Cardiovascular: Denies  chest pain. Respiratory: Denies shortness of breath. Gastrointestinal: Positive for generalized abdominal pain abdominal pain.  No nausea, no vomiting.  No diarrhea.  No constipation. Genitourinary: Negative for dysuria. Musculoskeletal: Negative for neck pain.  Negative for back pain. Integumentary: Negative for rash. Neurological: Negative for headaches, focal weakness or numbness.   ____________________________________________   PHYSICAL EXAM:  VITAL SIGNS: ED Triage Vitals  Enc Vitals Group     BP 08/16/18 2326 118/84     Pulse Rate 08/16/18 2326 (!) 110     Resp 08/16/18 2326 (!) 24     Temp 08/16/18 2326 97.9 F (36.6 C)     Temp Source 08/16/18 2326 Oral     SpO2 08/16/18 2326 98 %     Weight 08/16/18 2327 82 kg (180 lb 12.4 oz)     Height 08/16/18 2327 1.499 m (4\' 11" )     Head Circumference  --      Peak Flow --      Pain Score 08/16/18 2327 10     Pain Loc --      Pain Edu? --      Excl. in [place]? --     Constitutional: Alert and oriented.  Apparent discomfort eyes: Conjunctivae are normal.  Head: Atraumatic. Mouth/Throat: Mucous membranes are moist.  Oropharynx non-erythematous. Neck: No stridor.   Cardiovascular: Normal rate, regular rhythm. Good peripheral circulation. Grossly normal heart sounds. Respiratory: Normal respiratory effort.  No retractions. Lungs CTAB. Gastrointestinal: Generalized tenderness to palpation.. No distention.  Musculoskeletal: No lower extremity tenderness nor edema. No gross deformities of extremities. Neurologic:  Normal speech and language. No gross focal neurologic deficits are appreciated.  Skin:  Skin is warm, dry and intact. No rash noted. Psychiatric: Mood and affect are normal. Speech and behavior are normal.  ____________________________________________   LABS (all labs ordered are listed, but only abnormal results are displayed)  Labs Reviewed  COMPREHENSIVE METABOLIC PANEL - Abnormal; Notable for the following components:      Result Value   Glucose, Bld 178 (*)    AST 45 (*)    All other components within normal limits  CBC WITH DIFFERENTIAL/PLATELET   ____________________________________________  EKG  ED ECG REPORT I, [place] N BROWN, the attending physician, personally viewed and interpreted this ECG.   Date: 08/17/2018  EKG Time: 11:29 PM  Rate: 109  Rhythm: Sinus tachycardia  Axis: Normal  Intervals: Normal  ST&T Change: None  ____________________________________________  RADIOLOGY I, [place] N BROWN, personally viewed and evaluated these images (plain radiographs) as part of my medical decision making, as well as reviewing the written report by the radiologist.  ED MD interpretation: Possible small bowel obstruction noted on CT abdomen pelvis per radiologist.  Official radiology report(s): Ct Abdomen  Pelvis W Contrast  Result Date: 08/17/2018 CLINICAL DATA:  57 year old female with generalized abdominal pain. Status post hernia repair. EXAM: CT ABDOMEN AND PELVIS WITH CONTRAST TECHNIQUE: Multidetector CT imaging of the abdomen and pelvis was performed using the standard protocol following bolus administration of intravenous contrast. CONTRAST:  142mL ISOVUE-300 IOPAMIDOL (ISOVUE-300) INJECTION 61% COMPARISON:  CT of the abdomen pelvis dated 08/14/2018 FINDINGS: Lower chest: Bibasilar atelectasis. No intra-abdominal free air or free fluid. Hepatobiliary: Apparent fatty infiltration of the liver. No intrahepatic biliary ductal dilatation. The gallbladder is unremarkable. Pancreas: Unremarkable. No pancreatic ductal dilatation or surrounding inflammatory changes. Spleen: Normal in size without focal abnormality. Adrenals/Urinary Tract: Adrenal glands are unremarkable. Kidneys are normal, without renal calculi, focal lesion, or hydronephrosis. Bladder is  unremarkable. Stomach/Bowel: Oral contrast from prior CT is noted in the colon. Left ventral lateral hernia containing loops of small bowel. There is stranding and small amount of fluid adjacent to the herniated loop of bowel which appears top-normal in size. There is narrowing of the entering and exiting loop of bowel at the hernia neck. Findings may represent a degree of obstruction within the hernia or mild strangulation. No pneumatosis. The hernia appears similar to the CT of 08/14/2018. There is a ventral hernia repair mesh. The current hernia is lateral to the mesh. Appendectomy. Vascular/Lymphatic: No significant vascular findings are present. No enlarged abdominal or pelvic lymph nodes. Reproductive: The uterus and ovaries are grossly unremarkable. Other: Midline vertical anterior abdominal wall incisional scar. There is diffuse subcutaneous edema and small amount of fluid the left anterior abdominal wall surrounding the hernia. Small pockets of air also  noted. Skin staples noted overlying this region. Findings consistent with recent surgery. No drainable fluid collection. Musculoskeletal: Bilateral femoral head avascular necrosis. No cortical collapse. No acute osseous pathology. IMPRESSION: Persistent left ventral lateral hernia containing loops of small bowel with findings suggestive of a degree of obstruction of the herniated loop of bowel. This hernia is lateral to the ventral hernia repair mesh and similar in appearance to the CT of 08/14/2018. Electronically Signed   By: Anner Crete M.D.   On: 08/17/2018 00:57     ____________________________________________   INITIAL IMPRESSION / ASSESSMENT AND PLAN / ED COURSE  As part of my medical decision making, I reviewed the following data within the [place] NUMBER   57 year old female presented with above-stated history and physical exam with generalized abdominal pain following hernia repair yesterday.  CT scan of the abdomen pelvis revealed hernia with possible small bowel obstruction.  Patient discussed with Dr. Adora Fridge who evaluated patient emergency department and admitted the patient for further management.  Patient was given multiple doses of IV morphine in the emergency department with minimal pain improvement ____________________________________________  FINAL CLINICAL IMPRESSION(S) / ED DIAGNOSES  Final diagnoses:  Generalized abdominal pain  Other specified intestinal obstruction, unspecified whether partial or complete ([employer])     MEDICATIONS GIVEN DURING THIS VISIT:  Medications  morphine 4 MG/ML injection (has no administration in time range)  nicotine (NICODERM CQ - dosed in mg/24 hours) patch 14 mg (has no administration in time range)  morphine 4 MG/ML injection 4 mg (4 mg Intravenous Given 08/16/18 2336)  ondansetron (ZOFRAN) injection 4 mg (4 mg Intravenous Given 08/16/18 2336)  0.9 %  sodium chloride infusion ( Intravenous New Bag/Given 08/16/18 2354)    iopamidol (ISOVUE-300) 61 % injection 100 mL (100 mLs Intravenous Contrast Given 08/17/18 0017)  morphine 4 MG/ML injection 4 mg (4 mg Intravenous Given 08/17/18 0109)  HYDROmorphone (DILAUDID) injection 1 mg (1 mg Intravenous Given 08/17/18 0231)     ED Discharge Orders    None       Note:  This document was prepared using Dragon voice recognition software and may include unintentional dictation errors.    Gregor Hams, MD 08/17/18 0234    Gregor Hams, MD 08/17/18 [phone]

## 2018-08-17 ENCOUNTER — Emergency Department: Payer: Medicare (Managed Care)

## 2018-08-17 ENCOUNTER — Other Ambulatory Visit: Payer: Self-pay

## 2018-08-17 ENCOUNTER — Encounter: Payer: Self-pay | Admitting: Radiology

## 2018-08-17 DIAGNOSIS — R109 Unspecified abdominal pain: Secondary | ICD-10-CM | POA: Diagnosis present

## 2018-08-17 DIAGNOSIS — J449 Chronic obstructive pulmonary disease, unspecified: Secondary | ICD-10-CM | POA: Diagnosis present

## 2018-08-17 DIAGNOSIS — Z87891 Personal history of nicotine dependence: Secondary | ICD-10-CM | POA: Diagnosis not present

## 2018-08-17 DIAGNOSIS — K432 Incisional hernia without obstruction or gangrene: Secondary | ICD-10-CM | POA: Diagnosis present

## 2018-08-17 DIAGNOSIS — R1032 Left lower quadrant pain: Secondary | ICD-10-CM

## 2018-08-17 DIAGNOSIS — Z6836 Body mass index (BMI) 36.0-36.9, adult: Secondary | ICD-10-CM | POA: Diagnosis not present

## 2018-08-17 DIAGNOSIS — E119 Type 2 diabetes mellitus without complications: Secondary | ICD-10-CM | POA: Diagnosis present

## 2018-08-17 DIAGNOSIS — G894 Chronic pain syndrome: Secondary | ICD-10-CM | POA: Diagnosis present

## 2018-08-17 DIAGNOSIS — I10 Essential (primary) hypertension: Secondary | ICD-10-CM | POA: Diagnosis present

## 2018-08-17 DIAGNOSIS — Z794 Long term (current) use of insulin: Secondary | ICD-10-CM | POA: Diagnosis not present

## 2018-08-17 DIAGNOSIS — Z5331 Laparoscopic surgical procedure converted to open procedure: Secondary | ICD-10-CM | POA: Diagnosis not present

## 2018-08-17 LAB — COMPREHENSIVE METABOLIC PANEL
ALT: 28 U/L (ref 0–44)
ALT: 33 U/L (ref 0–44)
AST: 38 U/L (ref 15–41)
AST: 45 U/L — ABNORMAL HIGH (ref 15–41)
Albumin: 3.5 g/dL (ref 3.5–5.0)
Albumin: 3.9 g/dL (ref 3.5–5.0)
Alkaline Phosphatase: 103 U/L (ref 38–126)
Alkaline Phosphatase: 82 U/L (ref 38–126)
Anion gap: 6 (ref 5–15)
Anion gap: 7 (ref 5–15)
BUN: 14 mg/dL (ref 6–20)
BUN: 16 mg/dL (ref 6–20)
CHLORIDE: 106 mmol/L (ref 98–111)
CO2: 29 mmol/L (ref 22–32)
CO2: 29 mmol/L (ref 22–32)
CREATININE: 0.66 mg/dL (ref 0.44–1.00)
Calcium: 8.6 mg/dL — ABNORMAL LOW (ref 8.9–10.3)
Calcium: 8.9 mg/dL (ref 8.9–10.3)
Chloride: 102 mmol/L (ref 98–111)
Creatinine, Ser: 0.71 mg/dL (ref 0.44–1.00)
GFR calc non Af Amer: >60 mL/min (ref >60)
GFR calc non Af Amer: >60 mL/min (ref >60)
Glucose, Bld: 101 mg/dL — ABNORMAL HIGH (ref 70–99)
Glucose, Bld: 178 mg/dL — ABNORMAL HIGH (ref 70–99)
Potassium: 4 mmol/L (ref 3.5–5.1)
Potassium: 4.1 mmol/L (ref 3.5–5.1)
SODIUM: 138 mmol/L (ref 135–145)
SODIUM: 141 mmol/L (ref 135–145)
Total Bilirubin: 0.6 mg/dL (ref 0.3–1.2)
Total Bilirubin: 0.9 mg/dL (ref 0.3–1.2)
Total Protein: 6.8 g/dL (ref 6.5–8.1)
Total Protein: 7.8 g/dL (ref 6.5–8.1)

## 2018-08-17 LAB — GLUCOSE, CAPILLARY
GLUCOSE-CAPILLARY: 92 mg/dL (ref 70–99)
GLUCOSE-CAPILLARY: 44 mg/dL — AB (ref 70–99)
GLUCOSE-CAPILLARY: 77 mg/dL (ref 70–99)
GLUCOSE-CAPILLARY: 88 mg/dL (ref 70–99)
GLUCOSE-CAPILLARY: 79 mg/dL (ref 70–99)
Glucose-Capillary: 100 mg/dL — ABNORMAL HIGH (ref 70–99)
Glucose-Capillary: 72 mg/dL (ref 70–99)

## 2018-08-17 LAB — CBC
HEMATOCRIT: 35.9 % (ref 35.0–47.0)
Hemoglobin: 12.4 g/dL (ref 12.0–16.0)
MCH: 32.2 pg (ref 26.0–34.0)
MCHC: 34.5 g/dL (ref 32.0–36.0)
MCV: 93.3 fL (ref 80.0–100.0)
Platelets: 155 10*3/uL (ref 150–440)
RBC: 3.84 MIL/uL (ref 3.80–5.20)
RDW: 13.8 % (ref 11.5–14.5)
WBC: 7.4 10*3/uL (ref 3.6–11.0)

## 2018-08-17 LAB — MAGNESIUM: MAGNESIUM: 2 mg/dL (ref 1.7–2.4)

## 2018-08-17 LAB — PHOSPHORUS: Phosphorus: 4.5 mg/dL (ref 2.5–4.6)

## 2018-08-17 MED ORDER — METHOCARBAMOL 500 MG PO TABS
500.0000 mg | ORAL_TABLET | Freq: Three times a day (TID) | ORAL | Status: DC
  Administered 2018-08-17 – 2018-08-19 (×5): 500 mg via ORAL
  Filled 2018-08-17 (×9): qty 1

## 2018-08-17 MED ORDER — VENLAFAXINE HCL ER 150 MG PO CP24
300.0000 mg | ORAL_CAPSULE | Freq: Every day | ORAL | Status: DC
  Administered 2018-08-17 – 2018-08-19 (×2): 300 mg via ORAL
  Filled 2018-08-17 (×3): qty 2

## 2018-08-17 MED ORDER — IPRATROPIUM-ALBUTEROL 20-100 MCG/ACT IN AERS: 1.0000 | INHALATION_SPRAY | Freq: Two times a day (BID) | RESPIRATORY_TRACT | Status: DC

## 2018-08-17 MED ORDER — DEXTROSE 50 % IV SOLN
25.0000 mL | INTRAVENOUS | Status: AC
  Administered 2018-08-17: 25 mL via INTRAVENOUS

## 2018-08-17 MED ORDER — INSULIN ASPART 100 UNIT/ML ~~LOC~~ SOLN
15.0000 [IU] | Freq: Every day | SUBCUTANEOUS | Status: DC
  Administered 2018-08-17: 15 [IU] via SUBCUTANEOUS
  Filled 2018-08-17: qty 1

## 2018-08-17 MED ORDER — ONDANSETRON HCL 4 MG/2ML IJ SOLN: 4.0000 mg | Freq: Four times a day (QID) | INTRAMUSCULAR | Status: DC | PRN

## 2018-08-17 MED ORDER — MORPHINE SULFATE (PF) 4 MG/ML IV SOLN
4.0000 mg | Freq: Once | INTRAVENOUS | Status: AC
  Administered 2018-08-17: 4 mg via INTRAVENOUS

## 2018-08-17 MED ORDER — ONDANSETRON 4 MG PO TBDP: 4.0000 mg | ORAL_TABLET | Freq: Four times a day (QID) | ORAL | Status: DC | PRN

## 2018-08-17 MED ORDER — HYDROMORPHONE HCL 1 MG/ML IJ SOLN
1.0000 mg | INTRAMUSCULAR | Status: DC | PRN
  Administered 2018-08-17 (×2): 1 mg via INTRAVENOUS
  Filled 2018-08-17 (×2): qty 1

## 2018-08-17 MED ORDER — PROCHLORPERAZINE MALEATE 10 MG PO TABS
10.0000 mg | ORAL_TABLET | Freq: Four times a day (QID) | ORAL | Status: DC | PRN
  Filled 2018-08-17: qty 1

## 2018-08-17 MED ORDER — KETOROLAC TROMETHAMINE 30 MG/ML IJ SOLN
30.0000 mg | Freq: Four times a day (QID) | INTRAMUSCULAR | Status: DC
  Administered 2018-08-17: 30 mg via INTRAVENOUS
  Filled 2018-08-17: qty 1

## 2018-08-17 MED ORDER — DEXTROSE IN LACTATED RINGERS 5 % IV SOLN
INTRAVENOUS | Status: DC
  Administered 2018-08-17 – 2018-08-18 (×3): via INTRAVENOUS

## 2018-08-17 MED ORDER — LACTATED RINGERS IV SOLN
INTRAVENOUS | Status: DC
  Administered 2018-08-17: 04:00:00 via INTRAVENOUS

## 2018-08-17 MED ORDER — HYDROMORPHONE HCL 1 MG/ML IJ SOLN
0.5000 mg | INTRAMUSCULAR | Status: DC | PRN
  Administered 2018-08-17 – 2018-08-19 (×7): 0.5 mg via INTRAVENOUS
  Filled 2018-08-17 (×7): qty 1

## 2018-08-17 MED ORDER — OXYCODONE HCL 5 MG PO TABS
5.0000 mg | ORAL_TABLET | ORAL | Status: DC | PRN
  Administered 2018-08-18 – 2018-08-19 (×3): 10 mg via ORAL
  Filled 2018-08-17 (×3): qty 2

## 2018-08-17 MED ORDER — IPRATROPIUM-ALBUTEROL 0.5-2.5 (3) MG/3ML IN SOLN
3.0000 mL | Freq: Four times a day (QID) | RESPIRATORY_TRACT | Status: DC
  Administered 2018-08-17 – 2018-08-19 (×6): 3 mL via RESPIRATORY_TRACT
  Filled 2018-08-17 (×6): qty 3

## 2018-08-17 MED ORDER — INSULIN GLARGINE 100 UNIT/ML ~~LOC~~ SOLN
15.0000 [IU] | Freq: Every day | SUBCUTANEOUS | Status: DC
  Administered 2018-08-17 – 2018-08-18 (×2): 15 [IU] via SUBCUTANEOUS
  Filled 2018-08-17 (×3): qty 0.15

## 2018-08-17 MED ORDER — HYDRALAZINE HCL 20 MG/ML IJ SOLN: 10.0000 mg | INTRAMUSCULAR | Status: DC | PRN

## 2018-08-17 MED ORDER — ALBUTEROL SULFATE HFA 108 (90 BASE) MCG/ACT IN AERS: 2.0000 | INHALATION_SPRAY | Freq: Four times a day (QID) | RESPIRATORY_TRACT | Status: DC | PRN

## 2018-08-17 MED ORDER — NICOTINE 14 MG/24HR TD PT24
14.0000 mg | MEDICATED_PATCH | Freq: Every day | TRANSDERMAL | Status: DC
  Administered 2018-08-17 – 2018-08-19 (×3): 14 mg via TRANSDERMAL
  Filled 2018-08-17 (×3): qty 1

## 2018-08-17 MED ORDER — FAMOTIDINE IN NACL 20-0.9 MG/50ML-% IV SOLN
20.0000 mg | Freq: Two times a day (BID) | INTRAVENOUS | Status: DC
  Administered 2018-08-17 – 2018-08-19 (×4): 20 mg via INTRAVENOUS
  Filled 2018-08-17 (×7): qty 50

## 2018-08-17 MED ORDER — TRAZODONE HCL 100 MG PO TABS
100.0000 mg | ORAL_TABLET | Freq: Every day | ORAL | Status: DC
  Administered 2018-08-17 – 2018-08-18 (×2): 100 mg via ORAL
  Filled 2018-08-17 (×3): qty 1

## 2018-08-17 MED ORDER — GABAPENTIN 300 MG PO CAPS
300.0000 mg | ORAL_CAPSULE | Freq: Three times a day (TID) | ORAL | Status: DC
  Administered 2018-08-17 – 2018-08-19 (×4): 300 mg via ORAL
  Filled 2018-08-17 (×4): qty 1

## 2018-08-17 MED ORDER — ALBUTEROL SULFATE (2.5 MG/3ML) 0.083% IN NEBU: 3.0000 mL | INHALATION_SOLUTION | Freq: Four times a day (QID) | RESPIRATORY_TRACT | Status: DC | PRN

## 2018-08-17 MED ORDER — MOMETASONE FURO-FORMOTEROL FUM 200-5 MCG/ACT IN AERO
2.0000 | INHALATION_SPRAY | Freq: Two times a day (BID) | RESPIRATORY_TRACT | Status: DC
  Administered 2018-08-17 – 2018-08-19 (×5): 2 via RESPIRATORY_TRACT
  Filled 2018-08-17: qty 8.8

## 2018-08-17 MED ORDER — IPRATROPIUM BROMIDE HFA 17 MCG/ACT IN AERS: 2.0000 | INHALATION_SPRAY | Freq: Four times a day (QID) | RESPIRATORY_TRACT | Status: DC | PRN

## 2018-08-17 MED ORDER — IPRATROPIUM BROMIDE 0.02 % IN SOLN
0.5000 mg | Freq: Four times a day (QID) | RESPIRATORY_TRACT | Status: DC | PRN
  Filled 2018-08-17: qty 2.5

## 2018-08-17 MED ORDER — CELECOXIB 200 MG PO CAPS
200.0000 mg | ORAL_CAPSULE | Freq: Every day | ORAL | Status: DC
  Administered 2018-08-17 – 2018-08-19 (×2): 200 mg via ORAL
  Filled 2018-08-17 (×3): qty 1

## 2018-08-17 MED ORDER — PROCHLORPERAZINE EDISYLATE 10 MG/2ML IJ SOLN
5.0000 mg | Freq: Four times a day (QID) | INTRAMUSCULAR | Status: DC | PRN
  Filled 2018-08-17: qty 2

## 2018-08-17 MED ORDER — HYDROMORPHONE HCL 1 MG/ML IJ SOLN
1.0000 mg | Freq: Once | INTRAMUSCULAR | Status: AC
  Administered 2018-08-17: 1 mg via INTRAVENOUS
  Filled 2018-08-17: qty 1

## 2018-08-17 MED ORDER — INSULIN LISPRO 100 UNIT/ML (KWIKPEN): 15.0000 [IU] | PEN_INJECTOR | Freq: Every day | SUBCUTANEOUS | Status: DC

## 2018-08-17 MED ORDER — MORPHINE SULFATE (PF) 4 MG/ML IV SOLN
INTRAVENOUS | Status: AC
  Filled 2018-08-17: qty 1

## 2018-08-17 MED ORDER — TIZANIDINE HCL 2 MG PO TABS
2.0000 mg | ORAL_TABLET | Freq: Three times a day (TID) | ORAL | Status: DC | PRN
  Filled 2018-08-17: qty 1

## 2018-08-17 MED ORDER — HEPARIN SODIUM (PORCINE) 5000 UNIT/ML IJ SOLN
5000.0000 [IU] | Freq: Three times a day (TID) | INTRAMUSCULAR | Status: DC
  Administered 2018-08-17 (×3): 5000 [IU] via SUBCUTANEOUS
  Filled 2018-08-17 (×4): qty 1

## 2018-08-17 MED ORDER — NICOTINE 14 MG/24HR TD PT24
MEDICATED_PATCH | TRANSDERMAL | Status: AC
  Administered 2018-08-18: 14 mg via TRANSDERMAL
  Filled 2018-08-17: qty 1

## 2018-08-17 MED ORDER — IOPAMIDOL (ISOVUE-300) INJECTION 61%
100.0000 mL | Freq: Once | INTRAVENOUS | Status: AC | PRN
  Administered 2018-08-17: 100 mL via INTRAVENOUS

## 2018-08-17 MED ORDER — ACETAMINOPHEN 325 MG PO TABS: 650.0000 mg | ORAL_TABLET | Freq: Four times a day (QID) | ORAL | Status: DC | PRN

## 2018-08-17 NOTE — Progress Notes (Signed)
Dr. Lysle Pearl in to assess patient.  Updated MD on vital signs including recent blood pressures and blood sugars, as well as recommendations from Diabetes Educator.  MD agrees to plan from Diabetes Educator and states he will enter orders this afternoon.  No further orders received regarding blood pressures. Reed Breech, RN 08/17/2018 3:49 PM

## 2018-08-17 NOTE — Progress Notes (Signed)
Contacted Dr. Lysle Pearl to notify of intermittent decreased oxygen saturation levels.  MD order received for PRN Oxygen therapy to keep sats above 92%. Reed Breech, RN 08/17/2018 5:50 PM

## 2018-08-17 NOTE — Progress Notes (Signed)
Inpatient Diabetes Program Recommendations  AACE/ADA: New Consensus Statement on Inpatient Glycemic Control (2019)  Target Ranges:  Prepandial:   less than 140 mg/dL      Peak postprandial:   less than 180 mg/dL (1-2 hours)      Critically ill patients:  140 - 180 mg/dL   Results for ABBAGALE, Kelly Dillon (MRN [id number]) as of 08/17/2018 14:13  Ref. Range 08/17/2018 08:09 08/17/2018 10:00 08/17/2018 10:31 08/17/2018 12:11  Glucose-Capillary Latest Ref Range: 70 - 99 mg/dL 92  Novolog 15 units 44 (LL) 100 (H) 72   Review of Glycemic Control  Diabetes history: DM2 Outpatient Diabetes medications: Lantus 15 units QHS, Humalog 15 units with breakfast, Metformin 1000 mg BID Current orders for Inpatient glycemic control: Lantus 15 units QHS, Novolog 15 units with breakfast  Inpatient Diabetes Program Recommendations: Correction (SSI): Please consider ordering Novolog 0-9 units TID with meals and Novolog 0-5 units QHS. Insulin - Meal Coverage: Please discontinue Novolog 15 units with breakfast.  Thanks, Barnie Alderman, RN, MSN, CDE Diabetes Coordinator Inpatient Diabetes Program [phone] (Team Pager from 8am to 5pm)

## 2018-08-17 NOTE — Progress Notes (Signed)
   08/17/18 0915  Clinical Encounter Type  Visited With Patient  Visit Type Follow-up  Referral From Nurse  Consult/Referral To Chaplain  Spiritual Encounters  Spiritual Needs Emotional;Prayer  Stress Factors  Patient Stress Factors Other (Comment) (worry about unknown)   Chaplain responded to order for patient support.  Patient openly expressed her concerns regarding health and upcoming surgery.  Chaplain utilized active and reflective listening as patient spoke of impact of the waiting, the unknowns ahead, and of the family (including 5 grandchildren) who are waiting for her.  She spoke of daughter's responsibilities in caring for children which preclude her ability to be present with patient.  Patient expressed feelings of loneliness and knowing that God is present, but not feeling the presence.  Chaplain and patient prayed for God's presence to be felt by the patient, for cares and concerns to be lifted and for wisdom and discernment for her care team.  Chaplain suggested a prayer shawl to offer patient a tangible reminder of God's love and presence to which patient agreed.  Chaplain secured shawl, draped it over patient with another prayer, and listened to how patient would use the shawl during her hospitalization.  Patient would like ongoing chaplain follow up and staff has encouraged that as well in our conversation.  Patient mentioned having met a chaplain the other day and unit chaplain will alert chaplain Grandville Silos of patient's hope for a visit from him.  Unit chaplain also updated on-call chaplain to order status and patient needs.

## 2018-08-17 NOTE — Evaluation (Signed)
Physical Therapy Evaluation Patient Details Name: Kelly Dillon MRN: [id number] DOB: [birth year] Today's Date: 08/17/2018   History of Present Illness  Patient with recent hernia repair, returned to ED with complaints of abdominal pain, nausea, vomiting. PMH includes COPD, DM, HTN. Patient reports she has home O2. Staying with daughter currently.     Clinical Impression  Patient received in room with chaplain and RN present. Patient excited to walk. No assistance needed with bed mobility or transfers. Educated patient to take her time transitioning as her blood pressure is slightly low. Ambulated 2 laps around nurses station with rw and supervision. No reports of dizziness or difficulty. No lob. Patient cleared to walk with nursing assist while here in hospital.    Follow Up Recommendations No PT follow up    Equipment Recommendations  None recommended by PT    Recommendations for Other Services       Precautions / Restrictions Precautions Precautions: Fall Restrictions Weight Bearing Restrictions: No      Mobility  Bed Mobility Overal bed mobility: Independent             General bed mobility comments: increased time needed due to soreness in abdomen  Transfers Overall transfer level: Modified independent Equipment used: Rolling walker (2 wheeled)             General transfer comment: increased time due to pain  Ambulation/Gait Ambulation/Gait assistance: Min guard   Assistive device: Rolling walker (2 wheeled) Gait Pattern/deviations: WFL(Within Functional Limits)     General Gait Details: patient ambulating without difficulty. Talking the entire way.  Stairs            Wheelchair Mobility    Modified Rankin (Stroke Patients Only)       Balance Overall balance assessment: Independent                                           Pertinent Vitals/Pain Pain Assessment: 0-10 Pain Score: 7  Pain Descriptors / Indicators:  Discomfort;Dull;Operative site guarding;Aching;Grimacing Pain Intervention(s): Limited activity within patient's tolerance;Monitored during session    Home Living Family/patient expects to be discharged to:: Private residence Living Arrangements: Children Available Help at Discharge: Family Type of Home: House Home Access: Stairs to enter   Technical brewer of Steps: 2 Home Layout: One level Home Equipment: Environmental consultant - 2 wheels      Prior Function Level of Independence: Independent               Hand Dominance        Extremity/Trunk Assessment   Upper Extremity Assessment Upper Extremity Assessment: Overall WFL for tasks assessed    Lower Extremity Assessment Lower Extremity Assessment: Overall WFL for tasks assessed    Cervical / Trunk Assessment Cervical / Trunk Assessment: Normal  Communication   Communication: No difficulties  Cognition Arousal/Alertness: Awake/alert Behavior During Therapy: WFL for tasks assessed/performed Overall Cognitive Status: Within Functional Limits for tasks assessed                                        General Comments      Exercises     Assessment/Plan    PT Assessment Patent does not need any further PT services  PT Problem List         PT  Treatment Interventions      PT Goals (Current goals can be found in the Care Plan section)  Acute Rehab PT Goals Patient Stated Goal: to be able to eat ang go home this weekend PT Goal Formulation: With patient Time For Goal Achievement: 08/31/18 Potential to Achieve Goals: Good    Frequency     Barriers to discharge        Co-evaluation               AM-PAC PT "6 Clicks" Daily Activity  Outcome Measure Difficulty turning over in bed (including adjusting bedclothes, sheets and blankets)?: None Difficulty moving from lying on back to sitting on the side of the bed? : None Difficulty sitting down on and standing up from a chair with arms  (e.g., wheelchair, bedside commode, etc,.)?: None Help needed moving to and from a bed to chair (including a wheelchair)?: None Help needed walking in hospital room?: None Help needed climbing 3-5 steps with a railing? : None 6 Click Score: 24    End of Session Equipment Utilized During Treatment: Gait belt Activity Tolerance: Patient tolerated treatment well;No increased pain Patient left: in bed;with chair alarm set;with call bell/phone within reach Nurse Communication: Mobility status PT Visit Diagnosis: Other abnormalities of gait and mobility (R26.89)    Time: 8182-9937 PT Time Calculation (min) (ACUTE ONLY): 28 min   Charges:   PT Evaluation $PT Eval Low Complexity: 1 Low          Jamiyah Dingley, PT, GCS 08/17/18,1:55 PM

## 2018-08-17 NOTE — Progress Notes (Signed)
   08/17/18 1300  Clinical Encounter Type  Visited With Patient  Visit Type Follow-up;Spiritual support  Referral From Chaplain  Recommendations Follow-up, as requested.  Spiritual Encounters  Spiritual Needs Emotional;Prayer  Stress Factors  Patient Stress Factors  (Generalized anxiety; fear of sleeping)   Patient is generally anxious about her medical condition and the future. She is fearful that if she sleeps, her intestines will burst and that she will die. Though she professes no fear of dying, she is trying to stay away. She needs assurance that all will be well. The patient spoke tearfully about her grandchildren and wanting to see the baby grow up to 47 years-old. Chaplain reminded the patient to "rest in the spirit," and allow God and the nursing team to take care of her. Chaplain offered a blessing and prayer for health, protection, and peace.

## 2018-08-17 NOTE — Anesthesia Preprocedure Evaluation (Addendum)
Anesthesia Evaluation  Patient identified by MRN, date of birth, ID band Patient awake    Reviewed: Allergy & Precautions, NPO status , Patient's Chart, lab work & pertinent test results  History of Anesthesia Complications Negative for: history of anesthetic complications  Airway Mallampati: III       Dental   Pulmonary neg sleep apnea, COPD,  COPD inhaler, neg recent URI, former smoker,           Cardiovascular hypertension, Pt. on medications (-) Past MI and (-) CHF (-) dysrhythmias (-) Valvular Problems/Murmurs     Neuro/Psych neg Seizures Depression    GI/Hepatic Neg liver ROS, neg GERD  ,  Endo/Other  diabetes, Type 2, Oral Hypoglycemic Agents  Renal/GU negative Renal ROS     Musculoskeletal  (+) Arthritis , Fibromyalgia -  Abdominal   Peds  Hematology   Anesthesia Other Findings    Reproductive/Obstetrics                            Anesthesia Physical Anesthesia Plan  ASA: III and emergent  Anesthesia Plan: General   Post-op Pain Management:    Induction: Intravenous  PONV Risk Score and Plan: 3 and Dexamethasone, Ondansetron and Midazolam  Airway Management Planned: Oral ETT  Additional Equipment:   Intra-op Plan:   Post-operative Plan:   Informed Consent: I have reviewed the patients History and Physical, chart, labs and discussed the procedure including the risks, benefits and alternatives for the proposed anesthesia with the patient or authorized representative who has indicated his/her understanding and acceptance.     Plan Discussed with:   Anesthesia Plan Comments:         Anesthesia Quick Evaluation

## 2018-08-17 NOTE — Progress Notes (Signed)
   08/17/18 1130  Clinical Encounter Type  Visited With Patient;Health care provider  Visit Type Follow-up  Spiritual Encounters  Spiritual Needs Emotional   Chaplain followed up with patient as discussed earlier.  Patient shared stories and photos of family with chaplain, engaged in life review and role of support that family plays in her life.  Chaplain checked in with patient's nurse regarding ongoing support available for patient and listening to nurse's observations of patient's demeanor since this morning.

## 2018-08-17 NOTE — Progress Notes (Signed)
Hypoglycemic episode with blood sugar of 44.  Pt complains of feeling "shaky".  25 ml of D50 administered per Hypoglycemic protocol.  Pt reports that "shaky feeling" is resolved, and blood sugar of 100 obtained 15 minutes after administration.  Dr. Lysle Pearl notified.  MD states he will change Iv fluids to D5LR, and pt is to remain NPO.  Reed Breech, RN 08/17/2018 11:02 AM

## 2018-08-17 NOTE — Progress Notes (Signed)
Subjective:  CC:  Kelly Dillon is a 56 y.o. female  Hospital stay day 0,   recurrent ventral hernia  HPI: No issues.  Pain better controlled, asking for diet.  ROS:  A 5 point review of systems was performed and pertinent positives and negatives noted in HPI.   Objective:      Temp:  [97.8 F (36.6 C)-98.6 F (37 C)] 98.5 F (36.9 C) (09/27 2002) Pulse Rate:  [62-110] 76 (09/27 2002) Resp:  [18-24] 18 (09/27 2002) BP: (81-118)/(51-92) 112/78 (09/27 2002) SpO2:  [91 %-99 %] 95 % (09/27 2054) Weight:  [82 kg] 82 kg (09/26 2327)     Height: 4\' 11"  (149.9 cm) Weight: 82 kg BMI (Calculated): 36.49   Intake/Output this shift:  Total I/O In: 632.8 [I.V.:582.8; IV Piggyback:50] Out: -        Constitutional :  alert, cooperative, appears stated age and no distress  Respiratory:  clear to auscultation bilaterally  Cardiovascular:  regular rate and rhythm  Gastrointestinal: soft, non-tender; bowel sounds normal; no masses,  no organomegaly. Ventral hernia easily reducible  Skin: Cool and moist.   Psychiatric: Normal affect, non-agitated, not confused       LABS:  CMP Latest Ref Rng & Units 08/17/2018 08/16/2018 08/16/2018  Glucose 70 - 99 mg/dL 101(H) 178(H) 160(H)  BUN 6 - 20 mg/dL 14 16 13   Creatinine 0.44 - 1.00 mg/dL 0.66 0.71 0.61  Sodium 135 - 145 mmol/L 141 138 138  Potassium 3.5 - 5.1 mmol/L 4.1 4.0 4.1  Chloride 98 - 111 mmol/L 106 102 99  CO2 22 - 32 mmol/L 29 29 30   Calcium 8.9 - 10.3 mg/dL 8.6(L) 8.9 9.0  Total Protein 6.5 - 8.1 g/dL 6.8 7.8 -  Total Bilirubin 0.3 - 1.2 mg/dL 0.6 0.9 -  Alkaline Phos 38 - 126 U/L 82 103 -  AST 15 - 41 U/L 38 45(H) -  ALT 0 - 44 U/L 28 33 -   CBC Latest Ref Rng & Units 08/17/2018 08/16/2018 08/16/2018  WBC 3.6 - 11.0 K/uL 7.4 9.7 10.4  Hemoglobin 12.0 - 16.0 g/dL 12.4 13.7 13.9  Hematocrit 35.0 - 47.0 % 35.9 38.6 41.3  Platelets 150 - 440 K/uL 155 164 168    RADS: CLINICAL DATA:  57 year old female with generalized abdominal  pain. Status post hernia repair.  EXAM: CT ABDOMEN AND PELVIS WITH CONTRAST  TECHNIQUE: Multidetector CT imaging of the abdomen and pelvis was performed using the standard protocol following bolus administration of intravenous contrast.  CONTRAST:  159mL ISOVUE-300 IOPAMIDOL (ISOVUE-300) INJECTION 61%  COMPARISON:  CT of the abdomen pelvis dated 08/14/2018  FINDINGS: Lower chest: Bibasilar atelectasis.  No intra-abdominal free air or free fluid.  Hepatobiliary: Apparent fatty infiltration of the liver. No intrahepatic biliary ductal dilatation. The gallbladder is unremarkable.  Pancreas: Unremarkable. No pancreatic ductal dilatation or surrounding inflammatory changes.  Spleen: Normal in size without focal abnormality.  Adrenals/Urinary Tract: Adrenal glands are unremarkable. Kidneys are normal, without renal calculi, focal lesion, or hydronephrosis. Bladder is unremarkable.  Stomach/Bowel: Oral contrast from prior CT is noted in the colon. Left ventral lateral hernia containing loops of small bowel. There is stranding and small amount of fluid adjacent to the herniated loop of bowel which appears top-normal in size. There is narrowing of the entering and exiting loop of bowel at the hernia neck. Findings may represent a degree of obstruction within the hernia or mild strangulation. No pneumatosis. The hernia appears similar to the CT of  08/14/2018. There is a ventral hernia repair mesh. The current hernia is lateral to the mesh. Appendectomy.  Vascular/Lymphatic: No significant vascular findings are present. No enlarged abdominal or pelvic lymph nodes.  Reproductive: The uterus and ovaries are grossly unremarkable.  Other: Midline vertical anterior abdominal wall incisional scar. There is diffuse subcutaneous edema and small amount of fluid the left anterior abdominal wall surrounding the hernia. Small pockets of air also noted. Skin staples noted  overlying this region. Findings consistent with recent surgery. No drainable fluid collection.  Musculoskeletal: Bilateral femoral head avascular necrosis. No cortical collapse. No acute osseous pathology.  IMPRESSION: Persistent left ventral lateral hernia containing loops of small bowel with findings suggestive of a degree of obstruction of the herniated loop of bowel. This hernia is lateral to the ventral hernia repair mesh and similar in appearance to the CT of 08/14/2018.   Electronically Signed   By: Anner Crete M.D.   On: 08/17/2018 00:57  Assessment:   Recurrent ventral hernia.  Due to repeat admission, and insistence of continued pain and nausea at home despite clinical exam been unimpressive, will proceed with laparoscopic repair, to remove hernia as potential factor to her persistent pain complaints.  She asked if she can be d/c'd for some personal matters in two days, for which I explained to her that it will all depend on how quickly she recovers from surgery and that I can make no guarantees.  Will also keep her NPO due to persistant complaint of nausea,  despite patient's severe reluctance.  Discussed the risk of surgery including recurrence, which can be up to 50% in the case of incisional or complex hernias, possible use of prosthetic materials (mesh) and the increased risk of mesh infxn if used, bleeding, chronic pain, post-op infxn, post-op SBO or ileus, and possible re-operation to address said risks. The risks of general anesthetic, if used, includes MI, CVA, sudden death or even reaction to anesthetic medications also discussed. Alternatives include continued observation.  Benefits include possible symptom relief, prevention of incarceration, strangulation, enlargement in size over time, and the risk of emergency surgery in the face of strangulation.  Typical post-op recovery time of 3-5 days with 4-6 weeks of activity restrictions were also discussed.    The  patient verbalized understanding and all questions were answered to the patient's satisfaction.

## 2018-08-17 NOTE — H&P (Signed)
Patient ID: Kelly Dillon, female   DOB: Jul 30, 1961, 57 y.o.   MRN: [id number]  HPI Kelly Dillon is a 57 y.o. female w hx chronic pain, Dm , COPD, obesity s/p primary repair of recurrent flank hernia by Dr. Lysle Pearl 9/27, was DC home yesterday and now comes with significant abd pain similar to before the operation. Some nausea and no vomiting. She did have a previous repair w mesh that recurred. No fevers or chills. CT scan pers reviewed some loops of SB within hernia, no strangulation, no free air. CBc and cmp normal.  HPI  Past Medical History:  Diagnosis Date  . COPD (chronic obstructive pulmonary disease) ([place])   . Diabetes mellitus without complication ([place])   . Hypertension     Past Surgical History:  Procedure Laterality Date  . APPENDECTOMY    . HERNIA REPAIR    . TUBAL LIGATION    . VENTRAL HERNIA REPAIR N/A 08/15/2018   Procedure: HERNIA REPAIR VENTRAL ADULT;  Surgeon: Benjamine Sprague, DO;  Location: [facility] ORS;  Service: General;  Laterality: N/A;    No family history on file.  Social History Social History   Tobacco Use  . Smoking status: Former Smoker    Types: Cigarettes    Last attempt to quit: 02/19/2017    Years since quitting: 1.4  . Smokeless tobacco: Never Used  Substance Use Topics  . Alcohol use: No  . Drug use: No    No Known Allergies  Current Facility-Administered Medications  Medication Dose Route Frequency Provider Last Rate Last Dose  . nicotine (NICODERM CQ - dosed in mg/24 hours) 14 mg/24hr patch           . morphine 4 MG/ML injection           . nicotine (NICODERM CQ - dosed in mg/24 hours) patch 14 mg  14 mg Transdermal Daily Pabon, [place] F, MD   14 mg at 08/17/18 0236   Current Outpatient Medications  Medication Sig Dispense Refill  . acetaminophen (TYLENOL) 325 MG tablet Take 2 tablets (650 mg total) by mouth every 6 (six) hours as needed for up to 10 days for moderate pain or fever. 40 tablet 0  . ADVAIR DISKUS 250-50 MCG/DOSE AEPB Inhale 1 puff into  the lungs 2 (two) times daily.    Marland Kitchen albuterol (PROVENTIL HFA;VENTOLIN HFA) 108 (90 Base) MCG/ACT inhaler Inhale 2 puffs into the lungs every 6 (six) hours as needed for wheezing or shortness of breath. 1 Inhaler 2  . albuterol (PROVENTIL) (2.5 MG/3ML) 0.083% nebulizer solution Inhale 3 mLs into the lungs every 6 (six) hours as needed.    Marland Kitchen atorvastatin (LIPITOR) 40 MG tablet Take 1 tablet by mouth daily.    . ATROVENT HFA 17 MCG/ACT inhaler Inhale 2 puffs into the lungs every 6 (six) hours as needed for wheezing.     . celecoxib (CELEBREX) 200 MG capsule Take 1 capsule (200 mg total) by mouth daily for 10 days. 10 capsule 0  . COMBIVENT RESPIMAT 20-100 MCG/ACT AERS respimat Inhale 1 puff into the lungs 2 (two) times daily.     Marland Kitchen docusate sodium (COLACE) 100 MG capsule Take 1 capsule (100 mg total) by mouth 2 (two) times daily as needed for up to 10 days for mild constipation. 20 capsule 0  . gabapentin (NEURONTIN) 300 MG capsule Take 300 mg by mouth 3 (three) times daily.    Marland Kitchen HYDROcodone-acetaminophen (NORCO) 7.5-325 MG tablet Take 1 tablet by mouth every 6 (six) hours  as needed for up to 7 days for severe pain. 28 tablet 0  . insulin glargine (LANTUS) 100 unit/mL SOPN Inject 0.15 mLs (15 Units total) into the skin at bedtime. 15 mL 0  . insulin lispro (HUMALOG KWIKPEN) 100 UNIT/ML KiwkPen Inject 0.15 mLs (15 Units total) into the skin daily with breakfast. 15 mL 0  . ipratropium-albuterol (DUONEB) 0.5-2.5 (3) MG/3ML SOLN Take 3 mLs by nebulization QID. 360 mL 0  . LYRICA 200 MG capsule Take 1 capsule by mouth 3 (three) times daily as needed.    . metFORMIN (GLUCOPHAGE) 1000 MG tablet Take 1 tablet by mouth 2 (two) times daily.    . methocarbamol (ROBAXIN) 500 MG tablet Take 500 mg by mouth 3 (three) times daily.    . nicotine (NICODERM CQ - DOSED IN MG/24 HOURS) 14 mg/24hr patch Place 1 patch (14 mg total) onto the skin daily. 28 patch 0  . omeprazole (PRILOSEC) 20 MG capsule Take 20 mg by mouth  daily.    Marland Kitchen oxybutynin (DITROPAN) 5 MG tablet Take 5 mg by mouth 2 (two) times daily.     Marland Kitchen Spacer/Aero Chamber Mouthpiece MISC 1 Units by Does not apply route every 4 (four) hours as needed (wheezing). 1 each 0  . tiZANidine (ZANAFLEX) 2 MG tablet Take 1 tablet by mouth every 8 (eight) hours as needed for muscle spasms.     . traZODone (DESYREL) 100 MG tablet Take 100 mg by mouth at bedtime.    . triamcinolone cream (KENALOG) 0.1 % Apply 1 application topically 2 (two) times daily.    Marland Kitchen venlafaxine XR (EFFEXOR-XR) 150 MG 24 hr capsule Take 2 capsules by mouth daily.        Review of Systems Full ROS  was asked and was negative except for the information on the HPI  Physical Exam Blood pressure 97/70, pulse 96, temperature 97.9 F (36.6 C), temperature source Oral, resp. rate (!) 21, height 4\' 11"  (1.499 m), weight 82 kg, SpO2 97 %. CONSTITUTIONAL: NAD EYES: Pupils are equal, round, and reactive to light, Sclera are non-icteric. EARS, NOSE, MOUTH AND THROAT: The oropharynx is clear. The oral mucosa is pink and moist. Hearing is intact to voice. LYMPH NODES:  Lymph nodes in the neck are normal. RESPIRATORY:  Lungs are clear. There is normal respiratory effort, with equal breath sounds bilaterally, and without pathologic use of accessory muscles. CARDIOVASCULAR: Heart is regular without murmurs, gallops, or rubs. GI: The abdomen is  soft, LLQ dressing removed. Staples in place, no erythema appropriate tenderness. No peritonitis GU: Rectal deferred.   MUSCULOSKELETAL: Normal muscle strength and tone. No cyanosis or edema.   SKIN: Turgor is good and there are no pathologic skin lesions or ulcers. NEUROLOGIC: Motor and sensation is grossly normal. Cranial nerves are grossly intact. PSYCH:  Oriented to person, place and time. Affect is normal.  Data Reviewed  I have personally reviewed the patient's imaging, laboratory findings and medical records.    Assessment/Plan 57 yo w recent primary  repair of complex recurrence Flank hernia in a pt that is morbidly obese. Clinically no evidence of complete obstruction. I do not think that we need to do any emergent surgical intervention at this time. We will keep NPO, IV fluids, pain control, no need for NGT at this time. I will d/w her operating surgeon about this case and defer to him further management of this complex case    Caroleen Hamman, MD FACS General Surgeon 08/17/2018, 2:37 AM

## 2018-08-17 NOTE — Progress Notes (Signed)
Called Dr. Lysle Pearl to notify of blood sugar of 44, which increased to 100 fifteen minutes after administration of D50.  MD states to follow Hypoglycemia protocol if further treatment needed.  Updated MD on pt status.  Pt states that she uses walker at home.  Verbal order received for PT consult and to change IVF to D5LR at 49ml/hr. Reed Breech, RN 08/17/2018 10:51 AM

## 2018-08-18 ENCOUNTER — Encounter
Admission: EM | Disposition: A | Discharge status: Home / Self Care | Payer: Self-pay | Source: Home / Self Care | Attending: Surgery

## 2018-08-18 ENCOUNTER — Inpatient Hospital Stay: Payer: Medicare (Managed Care) | Admitting: Anesthesiology

## 2018-08-18 HISTORY — PX: VENTRAL HERNIA REPAIR: SHX424

## 2018-08-18 LAB — GLUCOSE, CAPILLARY
Glucose-Capillary: 106 mg/dL — ABNORMAL HIGH (ref 70–99)
Glucose-Capillary: 141 mg/dL — ABNORMAL HIGH (ref 70–99)
Glucose-Capillary: 169 mg/dL — ABNORMAL HIGH (ref 70–99)
Glucose-Capillary: 203 mg/dL — ABNORMAL HIGH (ref 70–99)
Glucose-Capillary: 86 mg/dL (ref 70–99)
Glucose-Capillary: 95 mg/dL (ref 70–99)

## 2018-08-18 LAB — HIV ANTIBODY (ROUTINE TESTING W REFLEX): HIV Screen 4th Generation wRfx: NONREACTIVE

## 2018-08-18 SURGERY — REPAIR, HERNIA, VENTRAL, LAPAROSCOPIC
Anesthesia: General

## 2018-08-18 MED ORDER — FENTANYL CITRATE (PF) 100 MCG/2ML IJ SOLN
25.0000 ug | INTRAMUSCULAR | Status: AC | PRN
  Administered 2018-08-18 (×6): 25 ug via INTRAVENOUS

## 2018-08-18 MED ORDER — ONDANSETRON HCL 4 MG/2ML IJ SOLN
INTRAMUSCULAR | Status: DC | PRN
  Administered 2018-08-18 (×2): 4 mg via INTRAVENOUS

## 2018-08-18 MED ORDER — CEFAZOLIN SODIUM 1 G IJ SOLR
INTRAMUSCULAR | Status: AC
  Filled 2018-08-18: qty 20

## 2018-08-18 MED ORDER — FENTANYL CITRATE (PF) 100 MCG/2ML IJ SOLN
INTRAMUSCULAR | Status: AC
  Filled 2018-08-18: qty 2

## 2018-08-18 MED ORDER — ONDANSETRON HCL 4 MG/2ML IJ SOLN
INTRAMUSCULAR | Status: AC
  Filled 2018-08-18: qty 2

## 2018-08-18 MED ORDER — KETOROLAC TROMETHAMINE 30 MG/ML IJ SOLN
INTRAMUSCULAR | Status: AC
  Filled 2018-08-18: qty 1

## 2018-08-18 MED ORDER — SUGAMMADEX SODIUM 200 MG/2ML IV SOLN
INTRAVENOUS | Status: DC | PRN
  Administered 2018-08-18: 170 mg via INTRAVENOUS

## 2018-08-18 MED ORDER — INSULIN ASPART 100 UNIT/ML ~~LOC~~ SOLN
0.0000 [IU] | Freq: Three times a day (TID) | SUBCUTANEOUS | Status: DC
  Administered 2018-08-19: 1 [IU] via SUBCUTANEOUS
  Filled 2018-08-18: qty 1

## 2018-08-18 MED ORDER — BUPIVACAINE LIPOSOME 1.3 % IJ SUSP
INTRAMUSCULAR | Status: AC
  Filled 2018-08-18: qty 20

## 2018-08-18 MED ORDER — HYDROMORPHONE HCL 1 MG/ML IJ SOLN
INTRAMUSCULAR | Status: AC
  Filled 2018-08-18: qty 1

## 2018-08-18 MED ORDER — ROCURONIUM BROMIDE 100 MG/10ML IV SOLN
INTRAVENOUS | Status: DC | PRN
  Administered 2018-08-18 (×7): 10 mg via INTRAVENOUS
  Administered 2018-08-18: 40 mg via INTRAVENOUS

## 2018-08-18 MED ORDER — ROCURONIUM BROMIDE 50 MG/5ML IV SOLN
INTRAVENOUS | Status: AC
  Filled 2018-08-18: qty 1

## 2018-08-18 MED ORDER — LACTATED RINGERS IV SOLN
INTRAVENOUS | Status: DC | PRN
  Administered 2018-08-18: 11:00:00 via INTRAVENOUS

## 2018-08-18 MED ORDER — MIDAZOLAM HCL 2 MG/2ML IJ SOLN
INTRAMUSCULAR | Status: DC | PRN
  Administered 2018-08-18: 2 mg via INTRAVENOUS

## 2018-08-18 MED ORDER — LIDOCAINE HCL (PF) 2 % IJ SOLN
INTRAMUSCULAR | Status: AC
  Filled 2018-08-18: qty 10

## 2018-08-18 MED ORDER — INSULIN ASPART 100 UNIT/ML ~~LOC~~ SOLN
0.0000 [IU] | Freq: Every day | SUBCUTANEOUS | Status: DC
  Administered 2018-08-18: 5 [IU] via SUBCUTANEOUS
  Filled 2018-08-18: qty 1

## 2018-08-18 MED ORDER — FENTANYL CITRATE (PF) 100 MCG/2ML IJ SOLN
INTRAMUSCULAR | Status: DC | PRN
  Administered 2018-08-18: 100 ug via INTRAVENOUS
  Administered 2018-08-18 (×3): 50 ug via INTRAVENOUS

## 2018-08-18 MED ORDER — PROPOFOL 10 MG/ML IV BOLUS
INTRAVENOUS | Status: DC | PRN
  Administered 2018-08-18: 140 mg via INTRAVENOUS
  Administered 2018-08-18: 60 mg via INTRAVENOUS

## 2018-08-18 MED ORDER — ACETAMINOPHEN 10 MG/ML IV SOLN
INTRAVENOUS | Status: AC
  Filled 2018-08-18: qty 100

## 2018-08-18 MED ORDER — LIDOCAINE HCL (CARDIAC) PF 100 MG/5ML IV SOSY
PREFILLED_SYRINGE | INTRAVENOUS | Status: DC | PRN
  Administered 2018-08-18: 60 mg via INTRAVENOUS

## 2018-08-18 MED ORDER — MIDAZOLAM HCL 2 MG/2ML IJ SOLN
INTRAMUSCULAR | Status: AC
  Filled 2018-08-18: qty 2

## 2018-08-18 MED ORDER — PHENYLEPHRINE HCL 10 MG/ML IJ SOLN
INTRAMUSCULAR | Status: DC | PRN
  Administered 2018-08-18 (×10): 100 ug via INTRAVENOUS

## 2018-08-18 MED ORDER — DEXAMETHASONE SODIUM PHOSPHATE 10 MG/ML IJ SOLN
INTRAMUSCULAR | Status: DC | PRN
  Administered 2018-08-18: 10 mg via INTRAVENOUS

## 2018-08-18 MED ORDER — HYDROMORPHONE HCL 1 MG/ML IJ SOLN
INTRAMUSCULAR | Status: DC | PRN
  Administered 2018-08-18: 1 mg via INTRAVENOUS

## 2018-08-18 MED ORDER — SUGAMMADEX SODIUM 200 MG/2ML IV SOLN
INTRAVENOUS | Status: AC
  Filled 2018-08-18: qty 2

## 2018-08-18 MED ORDER — ONDANSETRON HCL 4 MG/2ML IJ SOLN: 4.0000 mg | Freq: Once | INTRAMUSCULAR | Status: DC | PRN

## 2018-08-18 MED ORDER — DEXAMETHASONE SODIUM PHOSPHATE 10 MG/ML IJ SOLN
INTRAMUSCULAR | Status: AC
  Filled 2018-08-18: qty 1

## 2018-08-18 MED ORDER — BUPIVACAINE HCL (PF) 0.5 % IJ SOLN
INTRAMUSCULAR | Status: AC
  Filled 2018-08-18: qty 30

## 2018-08-18 MED ORDER — BUPIVACAINE HCL (PF) 0.25 % IJ SOLN
INTRAMUSCULAR | Status: AC
  Filled 2018-08-18: qty 30

## 2018-08-18 MED ORDER — LACTATED RINGERS IV SOLN: INTRAVENOUS | Status: DC | PRN

## 2018-08-18 MED ORDER — PROPOFOL 10 MG/ML IV BOLUS
INTRAVENOUS | Status: AC
  Filled 2018-08-18: qty 20

## 2018-08-18 MED ORDER — ACETAMINOPHEN 10 MG/ML IV SOLN
INTRAVENOUS | Status: DC | PRN
  Administered 2018-08-18: 1000 mg via INTRAVENOUS

## 2018-08-18 MED ORDER — BUPIVACAINE HCL (PF) 0.5 % IJ SOLN
INTRAMUSCULAR | Status: DC | PRN
  Administered 2018-08-18: 7 mL
  Administered 2018-08-18: 23 mL

## 2018-08-18 MED ORDER — CEFAZOLIN SODIUM-DEXTROSE 2-3 GM-%(50ML) IV SOLR
INTRAVENOUS | Status: DC | PRN
  Administered 2018-08-18 (×2): 2 g via INTRAVENOUS

## 2018-08-18 SURGICAL SUPPLY — 57 items
BLADE SURG 11 STRL SS SAFETY (MISCELLANEOUS) ×2 IMPLANT
CANISTER SUCT 1200ML W/VALVE (MISCELLANEOUS) ×2 IMPLANT
CANNULA DILATOR 10 W/SLV (CANNULA) ×2 IMPLANT
CANNULA DILATOR 5 W/SLV (CANNULA) ×2 IMPLANT
CHLORAPREP W/TINT 26ML (MISCELLANEOUS) ×2 IMPLANT
DEFOGGER SCOPE WARMER CLEARIFY (MISCELLANEOUS) ×2 IMPLANT
DERMABOND ADVANCED (GAUZE/BANDAGES/DRESSINGS) ×1
DERMABOND ADVANCED .7 DNX12 (GAUZE/BANDAGES/DRESSINGS) ×1 IMPLANT
DRSG OPSITE POSTOP 4X12 (GAUZE/BANDAGES/DRESSINGS) ×2 IMPLANT
ELECT BLADE 6.5 EXT (BLADE) ×2 IMPLANT
ELECT REM PT RETURN 9FT ADLT (ELECTROSURGICAL) ×2
ELECTRODE REM PT RTRN 9FT ADLT (ELECTROSURGICAL) ×1 IMPLANT
GAUZE SPONGE 4X4 12PLY STRL (GAUZE/BANDAGES/DRESSINGS) IMPLANT
GLOVE BIO SURGEON STRL SZ 6.5 (GLOVE) ×12 IMPLANT
GLOVE BIOGEL PI IND STRL 7.0 (GLOVE) ×1 IMPLANT
GLOVE BIOGEL PI INDICATOR 7.0 (GLOVE) ×1
GOWN STRL REUS W/ TWL LRG LVL3 (GOWN DISPOSABLE) ×3 IMPLANT
GOWN STRL REUS W/TWL LRG LVL3 (GOWN DISPOSABLE) ×3
IRRIGATION STRYKERFLOW (MISCELLANEOUS) IMPLANT
IRRIGATOR STRYKERFLOW (MISCELLANEOUS)
KIT TURNOVER KIT A (KITS) ×2 IMPLANT
L-HOOK LAP DISP 36CM (ELECTROSURGICAL) ×2
LABEL OR SOLS (LABEL) ×2 IMPLANT
LHOOK LAP DISP 36CM (ELECTROSURGICAL) ×1 IMPLANT
LIGASURE VESSEL 5MM BLUNT TIP (ELECTROSURGICAL) ×2 IMPLANT
LIGHT WAVEGUIDE WIDE FLAT (MISCELLANEOUS) ×2 IMPLANT
MESH HERNIA 6X6 BARD (Mesh General) ×1 IMPLANT
MESH HERNIA BARD 6X6 (Mesh General) ×1 IMPLANT
NDL SAFETY ECLIPSE 18X1.5 (NEEDLE) IMPLANT
NEEDLE HYPO 18GX1.5 SHARP (NEEDLE)
NEEDLE HYPO 21X1.5 SAFETY (NEEDLE) IMPLANT
NEEDLE HYPO 22GX1.5 SAFETY (NEEDLE) ×2 IMPLANT
NEEDLE VERESS 14GA 120MM (NEEDLE) ×2 IMPLANT
NS IRRIG 500ML POUR BTL (IV SOLUTION) ×2 IMPLANT
PACK LAP CHOLECYSTECTOMY (MISCELLANEOUS) ×2 IMPLANT
PENCIL ELECTRO HAND CTR (MISCELLANEOUS) ×2 IMPLANT
REMOVER STAPLE SKIN (DISPOSABLE) ×2 IMPLANT
SCISSORS TIP ADVANCED DSP 25GA (INSTRUMENTS) ×2 IMPLANT
SLEEVE ENDOPATH XCEL 5M (ENDOMECHANICALS) ×2 IMPLANT
SPONGE LAP 18X18 RF (DISPOSABLE) ×6 IMPLANT
STAPLER SKIN PROX 35W (STAPLE) ×2 IMPLANT
SUT ETHIBOND 0 MO6 C/R (SUTURE) ×4 IMPLANT
SUT MNCRL 4-0 (SUTURE) ×2
SUT MNCRL 4-0 27XMFL (SUTURE) ×2
SUT VIC AB 2-0 CT1 27 (SUTURE) ×2
SUT VIC AB 2-0 CT1 TAPERPNT 27 (SUTURE) ×2 IMPLANT
SUT VIC AB 3-0 SH 27 (SUTURE) ×1
SUT VIC AB 3-0 SH 27X BRD (SUTURE) ×1 IMPLANT
SUT VICRYL 0 AB UR-6 (SUTURE) ×2 IMPLANT
SUTURE MNCRL 4-0 27XMF (SUTURE) ×2 IMPLANT
SYR 10ML LL (SYRINGE) ×2 IMPLANT
TOWEL OR 17X26 4PK STRL BLUE (TOWEL DISPOSABLE) IMPLANT
TRAY FOLEY MTR SLVR 16FR STAT (SET/KITS/TRAYS/PACK) ×2 IMPLANT
TROCAR XCEL BLUNT TIP 100MML (ENDOMECHANICALS) ×2 IMPLANT
TROCAR XCEL NON-BLD 11X100MML (ENDOMECHANICALS) ×2 IMPLANT
TROCAR XCEL NON-BLD 5MMX100MML (ENDOMECHANICALS) ×2 IMPLANT
TUBING INSUFFLATION (TUBING) ×2 IMPLANT

## 2018-08-18 NOTE — Transfer of Care (Signed)
Immediate Anesthesia Transfer of Care Note  Patient: Kelly Dillon  Procedure(s) Performed: LAPAROSCOPIC VENTRAL HERNIA (N/A )  Patient Location: PACU  Anesthesia Type:General  Level of Consciousness: awake, alert , oriented and patient cooperative  Airway & Oxygen Therapy: Patient connected to face mask oxygen  Post-op Assessment: Report given to RN, Post -op Vital signs reviewed and stable and Patient moving all extremities  Post vital signs: Reviewed and stable  Last Vitals:  Vitals Value Taken Time  BP 143/104 08/18/2018  1:16 PM  Temp    Pulse 118 08/18/2018  1:16 PM  Resp 24 08/18/2018  1:16 PM  SpO2 97 % 08/18/2018  1:16 PM  Vitals shown include unvalidated device data.  Last Pain:  Vitals:   08/18/18 0738  TempSrc: Oral  PainSc:       Patients Stated Pain Goal: 0 (18/86/77 3736)  Complications: No apparent anesthesia complications

## 2018-08-18 NOTE — Progress Notes (Signed)
Patient transported to OR via bed by orderly at 0750 on 08/18/18. Reed Breech, RN 08/18/2018 9:07 AM

## 2018-08-18 NOTE — Progress Notes (Signed)
Patient returned from [facility] at 1520 in bed by RN.  Verbal report received from PACU RN. Reed Breech, RN 08/18/2018

## 2018-08-18 NOTE — Anesthesia Post-op Follow-up Note (Signed)
Anesthesia QCDR form completed.        

## 2018-08-18 NOTE — Anesthesia Postprocedure Evaluation (Signed)
Anesthesia Post Note  Patient: Kelly Dillon  Procedure(s) Performed: LAPAROSCOPIC VENTRAL HERNIA (N/A )  Patient location during evaluation: PACU Anesthesia Type: General Level of consciousness: awake and alert Pain management: pain level controlled Vital Signs Assessment: post-procedure vital signs reviewed and stable Respiratory status: spontaneous breathing and respiratory function stable Cardiovascular status: stable Anesthetic complications: no     Last Vitals:  Vitals:   08/18/18 1314 08/18/18 1332  BP: (!) 143/104 126/90  Pulse: (!) 121 (!) 115  Resp: (!) 33 (!) 24  Temp: 37.1 C   SpO2: 97% 94%    Last Pain:  Vitals:   08/18/18 1332  TempSrc:   PainSc: 0-No pain                 KEPHART,WILLIAM K

## 2018-08-18 NOTE — Anesthesia Procedure Notes (Addendum)
Procedure Name: Intubation Date/Time: 08/18/2018 8:13 AM Performed by: Milus Height, CRNA Pre-anesthesia Checklist: Patient identified, Patient being monitored, Timeout performed, Emergency Drugs available and Suction available Patient Re-evaluated:Patient Re-evaluated prior to induction Oxygen Delivery Method: Circle system utilized Preoxygenation: Pre-oxygenation with 100% oxygen Induction Type: IV induction Ventilation: Mask ventilation without difficulty Laryngoscope Size: Mac and 3 Grade View: Grade II Tube type: Oral Tube size: 6.5 mm Number of attempts: 1 Airway Equipment and Method: Stylet Placement Confirmation: ETT inserted through vocal cords under direct vision,  positive ETCO2 and breath sounds checked- equal and bilateral Secured at: 19 cm Tube secured with: Tape Dental Injury: Teeth and Oropharynx as per pre-operative assessment

## 2018-08-19 LAB — GLUCOSE, CAPILLARY: Glucose-Capillary: 112 mg/dL — ABNORMAL HIGH (ref 70–99)

## 2018-08-19 MED ORDER — ENOXAPARIN SODIUM 40 MG/0.4ML ~~LOC~~ SOLN
40.0000 mg | SUBCUTANEOUS | Status: DC
  Administered 2018-08-19: 40 mg via SUBCUTANEOUS
  Filled 2018-08-19: qty 0.4

## 2018-08-19 MED ORDER — IPRATROPIUM-ALBUTEROL 0.5-2.5 (3) MG/3ML IN SOLN: 3.0000 mL | Freq: Three times a day (TID) | RESPIRATORY_TRACT | Status: DC

## 2018-08-19 NOTE — Progress Notes (Signed)
Discharge instructions provided.  Pt and pt's son verbalize understanding of all instructions and follow-up care.  Pt declined interpreter for any teaching.  Incision hygiene kit given. Pt discharged to home at 1335 on 08/19/18 via wheelchair by NT.  [place] , RN 08/19/2018 5:10 PM   

## 2018-08-19 NOTE — Progress Notes (Signed)
   08/19/18 0930  Clinical Encounter Type  Visited With Patient;Health care provider  Visit Type Follow-up  Recommendations check in later today   Chaplain attempted follow up.  Staff in room and patient indicated that patient was about to get a bath.  Chaplain to follow up later today.

## 2018-08-19 NOTE — Op Note (Signed)
Preoperative diagnosis: Recurrent ventral hernia  Postoperative diagnosis: same as above  Procedure: Laparoscopic converted to open recurrent ventral hernia repair with mesh  Anesthesia: GETA   Surgeon: Benjamine Sprague  Specimen: Hernia sac and contents  Complications: None  EBL: 337mL  Wound Classification: Clean   Indications: see HPI  Description of procedure: The patient was placed on the operating table in the supine position. SCDs placed, pre-op abx administered.  General anesthesia was induced and Foley placed. A time-out was completed verifying correct patient, procedure, site, positioning, and implant(s) and/or special equipment prior to beginning this procedure. The abdomen was prepped and draped in the usual sterile fashion.  This point was chosen to gain entry in the abdomen.  Initial attempt at Veress needle was unsuccessful so this was switched to a direct Optiview technique.  Under successful entrance in the abdominal cavity abdomen was insufflated to 15 mmHg with no issues.  Mom was reinserted into the abdominal cavity and there was no evidence of injury upon entry.  Hernia contents were noted to take up the entire field of view.  Additional 5 mm ports were placed in the lateral aspect with some difficulty but without any injury, and attempt was made to reduce the hernia contents laparoscopically but this was deemed unsuccessful.  Point decision was made to attempt to open repair through the former incision site.  Staples were removed and skin incised down to the original operative field where the original repair was noted to still be intact.  However superior to this area there was noted to be a large hernia sac.  Take it was dissection was carried out to isolate this hernia sac prior to entering it to inspect its contents.  Once the hernia sac was opened and preperitoneal fat was noted to be within the sac and this was removed with LigaSure after unsuccessful attempt at reduction.   This hernia sac was also removed and sent off operative field pending pathology.    Once the hernia was reduced below the superficial defect in the obliques was noted to be more hernia contents in between the transversalis and the oblique muscles.  These contents were also meticulously dissected off the surrounding tissue and then eventually reduced back into the preperitoneal space.  No evidence of bowel was noted within the hernia contents at this point.  The transversalis fascial defect was noted to be slightly lateral to the original defect and during the dissection procedure the superficial defect had to be extended more medial in order to fully visualize the deeper defect.  Once all hernia contents were reduced and the defect was noted, decision was made to place a polypropylene mesh over the transversalis fascia defect.  The 4 cm x 3.5 cm defect was covered with approximately 3 cm overlap on all sides.  The mesh was secured in place using 0 Ethibond after closing the primary defect with interrupted 0 Ethibond with minimal tension.  The superficial fascia defect was then primary closed over the mesh itself using 0 Ethibond in interrupted fashion.  She was under minimal tension and the mesh was fully covered at the end of this portion.  There is 6 subcutaneous later that was then closed in a 2 layer fashion using 3-0 Vicryl in interrupted fashion and the skin closed with staples.  The former port sites during the laparoscopic portion of this procedure was also closed with staples at this time.  Out the entire procedure meticulous hemostasis was achieved in order to prevent  hematoma formation.  patient extubated and transferred to PACU in stable condition Foley was removed. The patient tolerated the procedure well. All sponge and instrument count correct at end of procedure.

## 2018-08-19 NOTE — Progress Notes (Signed)
Contacted Dr. Lysle Pearl to clarify discharge orders.  MD states that pt has previous orders for narcotic pain medications from previous admission.  MD states to instruct patient to leave port site dressings in place for the next 24 hours, and to leave honeycomb dressing in place until post-op follow-up appointment. Reed Breech, RN 08/19/2018 11:46 AM

## 2018-08-19 NOTE — Discharge Instructions (Signed)
-  tylenol and advil as needed for discomfort.  Please alternate between the two every four hours as needed for pain.   - Use narcotics, if prescribed, only when tylenol and motrin is not enough to control pain. - 325-'650mg'$  every 8hrs to max of '4000mg'$ /24hrs (including the '325mg'$  in every norco dose) for the tylenol.   - Advil up to '800mg'$  per dose every 8hrs as needed for pain.    Call your doctor for incision concerns including redness, swelling, bleedidng or drainage, or if begins to come apart.  Please follow instructions included in incision hygiene kit.  Keep incision clean and dry.  No strenuous activity or heavy lifting for 6 weeks.  No intercourse, tampons, or douching for 6 weeks.  No tub baths- showers only.  Call your doctor for increased pain, temperature above 100.4, depression, or concerns.

## 2018-08-20 ENCOUNTER — Encounter: Payer: Self-pay | Admitting: Surgery

## 2018-08-20 LAB — GLUCOSE, CAPILLARY: Glucose-Capillary: 140 mg/dL — ABNORMAL HIGH (ref 70–99)

## 2018-08-21 LAB — SURGICAL PATHOLOGY

## 2018-08-21 NOTE — Discharge Summary (Signed)
Physician Discharge Summary  Patient ID: Kelly Dillon MRN: [id number] DOB/AGE: 28-Nov-1960 57 y.o.  Admit date: 08/16/2018 Discharge date: 08/21/2018  Admission Diagnoses: recurrent ventral hernia  Discharge Diagnoses:  Same as above  Discharged Condition: good  Hospital Course: Patient is a readmission due to persistent pain at the recurrent ventral hernia site.  She underwent a more definitive repair of the hernia during this admission.  Please see op note for further details.  Patient recovered without any incidents tolerating diet pain controlled on oral pain meds so was deemed ready for discharge.  She will follow-up with me in office as a postop check  Consults: None  Discharge Exam: Blood pressure 115/76, pulse 86, temperature 98 F (36.7 C), temperature source Oral, resp. rate 20, height 4\' 11"  (1.499 m), weight 82 kg, SpO2 95 %. General appearance: alert, cooperative and no distress GI: soft, non-tender; bowel sounds normal; no masses,  no organomegaly and incision and port site staples intact, no erythema, induration to indicate infection  Disposition:  Discharge disposition: 01-Home or Self Care       Discharge Instructions    Discharge patient   Complete by:  As directed    Discharge disposition:  01-Home or Self Care   Discharge patient date:  08/19/2018     Allergies as of 08/19/2018   No Known Allergies     Medication List    TAKE these medications   acetaminophen 325 MG tablet Commonly known as:  TYLENOL Take 2 tablets (650 mg total) by mouth every 6 (six) hours as needed for up to 10 days for moderate pain or fever.   ADVAIR DISKUS 250-50 MCG/DOSE Aepb Generic drug:  Fluticasone-Salmeterol Inhale 1 puff into the lungs 2 (two) times daily.   albuterol 108 (90 Base) MCG/ACT inhaler Commonly known as:  PROVENTIL HFA;VENTOLIN HFA Inhale 2 puffs into the lungs every 6 (six) hours as needed for wheezing or shortness of breath.   albuterol (2.5 MG/3ML)  0.083% nebulizer solution Commonly known as:  PROVENTIL Inhale 3 mLs into the lungs every 6 (six) hours as needed.   atorvastatin 40 MG tablet Commonly known as:  LIPITOR Take 1 tablet by mouth daily.   ATROVENT HFA 17 MCG/ACT inhaler Generic drug:  ipratropium Inhale 2 puffs into the lungs every 6 (six) hours as needed for wheezing.   celecoxib 200 MG capsule Commonly known as:  CELEBREX Take 1 capsule (200 mg total) by mouth daily for 10 days.   docusate sodium 100 MG capsule Commonly known as:  COLACE Take 1 capsule (100 mg total) by mouth 2 (two) times daily as needed for up to 10 days for mild constipation.   gabapentin 300 MG capsule Commonly known as:  NEURONTIN Take 300 mg by mouth 3 (three) times daily.   HYDROcodone-acetaminophen 7.5-325 MG tablet Commonly known as:  NORCO Take 1 tablet by mouth every 6 (six) hours as needed for up to 7 days for severe pain.   insulin glargine 100 unit/mL Sopn Commonly known as:  LANTUS Inject 0.15 mLs (15 Units total) into the skin at bedtime.   insulin lispro 100 UNIT/ML KiwkPen Commonly known as:  HUMALOG Inject 0.15 mLs (15 Units total) into the skin daily with breakfast.   ipratropium-albuterol 0.5-2.5 (3) MG/3ML Soln Commonly known as:  DUONEB Take 3 mLs by nebulization QID.   COMBIVENT RESPIMAT 20-100 MCG/ACT Aers respimat Generic drug:  Ipratropium-Albuterol Inhale 1 puff into the lungs 2 (two) times daily.   LYRICA 200 MG capsule  Generic drug:  pregabalin Take 1 capsule by mouth 3 (three) times daily as needed.   metFORMIN 1000 MG tablet Commonly known as:  GLUCOPHAGE Take 1 tablet by mouth 2 (two) times daily.   methocarbamol 500 MG tablet Commonly known as:  ROBAXIN Take 500 mg by mouth 3 (three) times daily.   nicotine 14 mg/24hr patch Commonly known as:  NICODERM CQ - dosed in mg/24 hours Place 1 patch (14 mg total) onto the skin daily.   omeprazole 20 MG capsule Commonly known as:  PRILOSEC Take 20  mg by mouth daily.   oxybutynin 5 MG tablet Commonly known as:  DITROPAN Take 5 mg by mouth 2 (two) times daily.   Spacer/Aero Chamber Mouthpiece Misc 1 Units by Does not apply route every 4 (four) hours as needed (wheezing).   tiZANidine 2 MG tablet Commonly known as:  ZANAFLEX Take 1 tablet by mouth every 8 (eight) hours as needed for muscle spasms.   traZODone 100 MG tablet Commonly known as:  DESYREL Take 100 mg by mouth at bedtime.   triamcinolone cream 0.1 % Commonly known as:  KENALOG Apply 1 application topically 2 (two) times daily.   venlafaxine XR 150 MG 24 hr capsule Commonly known as:  EFFEXOR-XR Take 2 capsules by mouth daily.      Follow-up Information    [place], Renzo Vincelette, DO Follow up.   Specialty:  Surgery Why:  keep previous appt on 10/4 Contact information: [address] [place]  [address] [phone]            Total time spent arranging discharge was >87min. Signed: Benjamine Sprague 08/21/2018, 10:17 AM

## 2018-09-22 DIAGNOSIS — F418 Other specified anxiety disorders: Secondary | ICD-10-CM | POA: Insufficient documentation

## 2018-10-13 DIAGNOSIS — E785 Hyperlipidemia, unspecified: Secondary | ICD-10-CM | POA: Insufficient documentation

## 2018-10-13 DIAGNOSIS — G4700 Insomnia, unspecified: Secondary | ICD-10-CM | POA: Insufficient documentation

## 2018-11-03 DIAGNOSIS — J984 Other disorders of lung: Secondary | ICD-10-CM | POA: Insufficient documentation

## 2019-02-26 ENCOUNTER — Emergency Department
Admission: EM | Admit: 2019-02-26 | Discharge: 2019-02-26 | Disposition: A | Discharge status: Home / Self Care | Payer: Medicare Other | Attending: Emergency Medicine | Admitting: Emergency Medicine

## 2019-02-26 ENCOUNTER — Emergency Department: Payer: Medicare Other

## 2019-02-26 ENCOUNTER — Other Ambulatory Visit: Payer: Self-pay

## 2019-02-26 ENCOUNTER — Encounter: Payer: Self-pay | Admitting: Emergency Medicine

## 2019-02-26 DIAGNOSIS — R109 Unspecified abdominal pain: Secondary | ICD-10-CM | POA: Diagnosis present

## 2019-02-26 DIAGNOSIS — E119 Type 2 diabetes mellitus without complications: Secondary | ICD-10-CM | POA: Diagnosis not present

## 2019-02-26 DIAGNOSIS — R1012 Left upper quadrant pain: Secondary | ICD-10-CM | POA: Diagnosis not present

## 2019-02-26 DIAGNOSIS — Z794 Long term (current) use of insulin: Secondary | ICD-10-CM | POA: Diagnosis not present

## 2019-02-26 DIAGNOSIS — Z87891 Personal history of nicotine dependence: Secondary | ICD-10-CM | POA: Insufficient documentation

## 2019-02-26 DIAGNOSIS — R0789 Other chest pain: Secondary | ICD-10-CM | POA: Insufficient documentation

## 2019-02-26 DIAGNOSIS — R1032 Left lower quadrant pain: Secondary | ICD-10-CM | POA: Insufficient documentation

## 2019-02-26 DIAGNOSIS — I10 Essential (primary) hypertension: Secondary | ICD-10-CM | POA: Diagnosis not present

## 2019-02-26 DIAGNOSIS — Z79899 Other long term (current) drug therapy: Secondary | ICD-10-CM | POA: Diagnosis not present

## 2019-02-26 DIAGNOSIS — J449 Chronic obstructive pulmonary disease, unspecified: Secondary | ICD-10-CM | POA: Insufficient documentation

## 2019-02-26 DIAGNOSIS — G894 Chronic pain syndrome: Secondary | ICD-10-CM | POA: Diagnosis not present

## 2019-02-26 HISTORY — DX: Fibromyalgia: M79.7

## 2019-02-26 HISTORY — DX: Chronic pain syndrome: G89.4

## 2019-02-26 LAB — CBC
HCT: 39.9 % (ref 36.0–46.0)
Hemoglobin: 13.3 g/dL (ref 12.0–15.0)
MCH: 29.1 pg (ref 26.0–34.0)
MCHC: 33.3 g/dL (ref 30.0–36.0)
MCV: 87.3 fL (ref 80.0–100.0)
Platelets: 187 10*3/uL (ref 150–400)
RBC: 4.57 MIL/uL (ref 3.87–5.11)
RDW: 14.3 % (ref 11.5–15.5)
WBC: 9.9 10*3/uL (ref 4.0–10.5)
nRBC: 0 % (ref 0.0–0.2)

## 2019-02-26 LAB — BASIC METABOLIC PANEL
Anion gap: 9 (ref 5–15)
BUN: 23 mg/dL — ABNORMAL HIGH (ref 6–20)
CO2: 24 mmol/L (ref 22–32)
Calcium: 8.7 mg/dL — ABNORMAL LOW (ref 8.9–10.3)
Chloride: 102 mmol/L (ref 98–111)
Creatinine, Ser: 0.88 mg/dL (ref 0.44–1.00)
GFR calc Af Amer: >60 mL/min (ref >60)
GFR calc non Af Amer: >60 mL/min (ref >60)
Glucose, Bld: 155 mg/dL — ABNORMAL HIGH (ref 70–99)
Potassium: 3.8 mmol/L (ref 3.5–5.1)
Sodium: 135 mmol/L (ref 135–145)

## 2019-02-26 LAB — HEPATIC FUNCTION PANEL
ALT: 30 U/L (ref 0–44)
AST: 41 U/L (ref 15–41)
Albumin: 3.7 g/dL (ref 3.5–5.0)
Alkaline Phosphatase: 117 U/L (ref 38–126)
Bilirubin, Direct: <0.1 mg/dL (ref 0.0–0.2)
Total Bilirubin: 0.5 mg/dL (ref 0.3–1.2)
Total Protein: 8.4 g/dL — ABNORMAL HIGH (ref 6.5–8.1)

## 2019-02-26 LAB — TROPONIN I: Troponin I: <0.03 ng/mL (ref <0.03)

## 2019-02-26 LAB — LIPASE, BLOOD: Lipase: 46 U/L (ref 11–51)

## 2019-02-26 MED ORDER — ONDANSETRON HCL 4 MG/2ML IJ SOLN
4.0000 mg | INTRAMUSCULAR | Status: AC
  Administered 2019-02-26: 4 mg via INTRAVENOUS
  Filled 2019-02-26: qty 2

## 2019-02-26 MED ORDER — SODIUM CHLORIDE 0.9% FLUSH: 3.0000 mL | Freq: Once | INTRAVENOUS | Status: DC

## 2019-02-26 MED ORDER — IOPAMIDOL (ISOVUE-370) INJECTION 76%
100.0000 mL | Freq: Once | INTRAVENOUS | Status: AC | PRN
  Administered 2019-02-26: 100 mL via INTRAVENOUS

## 2019-02-26 MED ORDER — MORPHINE SULFATE (PF) 4 MG/ML IV SOLN
4.0000 mg | Freq: Once | INTRAVENOUS | Status: AC
  Administered 2019-02-26: 4 mg via INTRAVENOUS
  Filled 2019-02-26: qty 1

## 2019-02-26 NOTE — ED Provider Notes (Signed)
[facility] Emergency Department Provider Note  ____________________________________________   First MD Initiated Contact with Patient 02/26/19 [phone]     (approximate)  I have reviewed the triage vital signs and the nursing notes.   HISTORY  Chief Complaint Chest Pain    HPI Kelly Dillon is a 58 y.o. female with medical history as listed below with extensive PMH including multiple chronic pain syndromes (see the Active Problem List as well as PMH below) who presents by EMS for evaluation of abdominal and chest pain.  She reports it has been going on for three months since she had surgery to repair a recurrent ventral hernia.  Initially the pain was in the LLQ of the abdomen, but she reports it has been moving higher and higher over the last three months.  Now it starts in the LLQ but also hurts in the left upper part of her abdomen and radiates into her chest.  She reports that the pain used to happen a couple times a day and is now constant.  Nothing in particular makes it better and moving around and having any sort of activity makes it worse.  She says that she does not have much activity but she is in the process of moving to [place].  She was getting some help from friends or family since "I have a lot of conditions "and was trying not to move many things, but she moved a box.  She did not hurt while she was moving the box but when she sat down from moving the box she felt acute onset of severe pain that is been constant since then.  She has COPD but has not been having any worsening shortness of breath compared to baseline.  She has a chronic cough that has been no worse than usual.  She denies fever/chills and chest pain except for the pain that is radiating from her left upper abdomen into her chest.  She is adamant that she has been around only minimal people and that she has been trying to stay isolated to avoid coronavirus.  She has had nausea but no vomiting.   She denies diarrhea and dysuria.  She says she no longer goes to her pain clinic because her insurance changed but she has a number of medications at home  that include gabapentin, Robaxin, Lyrica, in addition to her diabetes medicine and inhalers.        Past Medical History:  Diagnosis Date   Chronic pain syndrome    extensive - see problem list   COPD (chronic obstructive pulmonary disease) ([place])    Diabetes mellitus without complication ([place])    Fibromyalgia    went to [facility] pain clinic for monthly lidocaine infusions   Hypertension     Patient Active Problem List   Diagnosis Date Noted   Abdominal pain 08/17/2018   Ventral hernia, recurrent 08/15/2018   Chronic generalized abdominal pain (Primary Area of Pain) 02/21/2018   Chronic ankle pain, bilateral (Secondary Area of Pain) (L>R) 02/21/2018   Chronic pain of both knees (Tertiary Area of Pain) (L>R) 02/21/2018   Chronic bilateral low back pain with bilateral sciatica (Fourth Area of Pain) (L>R) 02/21/2018   Chronic neck pain(midline) 02/21/2018   Chronic pain of both shoulders(L>R) 02/21/2018   Chronic upper extremity pain (L>R) 02/21/2018   Chronic pain of both lower extremities (L>R) 02/21/2018   Chronic pain syndrome 02/21/2018   Long term current use of opiate analgesic 02/21/2018   Pharmacologic therapy  02/21/2018   Disorder of skeletal system 02/21/2018   Problems influencing health status 02/21/2018   Morbid obesity with BMI of 40.0-44.9, adult ([place]) 09/28/2017   Shortness of breath 08/28/2017   High risk medication use 06/15/2017   Multiple joint pain 01/24/2017   Hernia of abdominal wall 01/12/2017   Sacroiliitis ([place]) 10/06/2016   Colon polyp 06/11/2016   BMI 39.0-39.9,adult 02/18/2016   Excessive daytime sleepiness 11/11/2015   Avascular necrosis of bones of both hips ([place]) 04/20/2015   Mixed stress and urge urinary incontinence 12/09/2014   Controlled type 2 diabetes  mellitus without complication ([place]) 10/96/0454   Chronic, continuous use of opioids 03/13/2013   DDD (degenerative disc disease), lumbar 03/13/2013   Suicide attempt ([place]) 03/13/2013   Recurrent ventral hernia 08/28/2012   Moderate COPD (chronic obstructive pulmonary disease) ([place]) 07/11/2012   Tobacco abuse 03/01/2012   Depression, major, recurrent, moderate ([place]) 07/21/2011   Diverticulosis 07/21/2011   Fibromyalgia 07/21/2011    Past Surgical History:  Procedure Laterality Date   APPENDECTOMY     HERNIA REPAIR     TUBAL LIGATION     VENTRAL HERNIA REPAIR N/A 08/15/2018   Procedure: HERNIA REPAIR VENTRAL ADULT;  Surgeon: Benjamine Sprague, DO;  Location: [facility] ORS;  Service: General;  Laterality: N/A;   VENTRAL HERNIA REPAIR N/A 08/18/2018   Procedure: LAPAROSCOPIC VENTRAL HERNIA;  Surgeon: Benjamine Sprague, DO;  Location: [facility] ORS;  Service: General;  Laterality: N/A;    Prior to Admission medications   Medication Sig Start Date End Date Taking? Authorizing Provider  ADVAIR DISKUS 250-50 MCG/DOSE AEPB Inhale 1 puff into the lungs 2 (two) times daily. 01/19/17   [provider]  albuterol (PROVENTIL HFA;VENTOLIN HFA) 108 (90 Base) MCG/ACT inhaler Inhale 2 puffs into the lungs every 6 (six) hours as needed for wheezing or shortness of breath. 03/28/17   Darel Hong, MD  albuterol (PROVENTIL) (2.5 MG/3ML) 0.083% nebulizer solution Inhale 3 mLs into the lungs every 6 (six) hours as needed. 08/04/18   [provider]  atorvastatin (LIPITOR) 40 MG tablet Take 1 tablet by mouth daily. 11/07/17 11/07/18  [provider]  ATROVENT HFA 17 MCG/ACT inhaler Inhale 2 puffs into the lungs every 6 (six) hours as needed for wheezing.  08/04/18   [provider]  COMBIVENT RESPIMAT 20-100 MCG/ACT AERS respimat Inhale 1 puff into the lungs 2 (two) times daily.  08/04/18   [provider]  gabapentin (NEURONTIN) 300 MG capsule Take 300 mg by mouth 3 (three)  times daily. 07/05/18   [provider]  insulin glargine (LANTUS) 100 unit/mL SOPN Inject 0.15 mLs (15 Units total) into the skin at bedtime. 04/01/17   Hillary Bow, MD  insulin lispro (HUMALOG KWIKPEN) 100 UNIT/ML KiwkPen Inject 0.15 mLs (15 Units total) into the skin daily with breakfast. 04/01/17   Hillary Bow, MD  ipratropium-albuterol (DUONEB) 0.5-2.5 (3) MG/3ML SOLN Take 3 mLs by nebulization QID. 04/01/17   Sudini, Srikar, MD  LYRICA 200 MG capsule Take 1 capsule by mouth 3 (three) times daily as needed. 02/22/17   [provider]  metFORMIN (GLUCOPHAGE) 1000 MG tablet Take 1 tablet by mouth 2 (two) times daily. 01/12/17   [provider]  methocarbamol (ROBAXIN) 500 MG tablet Take 500 mg by mouth 3 (three) times daily. 08/04/18   [provider]  nicotine (NICODERM CQ - DOSED IN MG/24 HOURS) 14 mg/24hr patch Place 1 patch (14 mg total) onto the skin daily. 08/16/18   Benjamine Sprague, DO  omeprazole (PRILOSEC) 20 MG capsule Take 20 mg by mouth daily. 07/05/18   [provider]  Spacer/Aero Chamber Mouthpiece MISC 1 Units by Does not apply route every 4 (four) hours as needed (wheezing). 03/28/17   Darel Hong, MD  tiZANidine (ZANAFLEX) 2 MG tablet Take 1 tablet by mouth every 8 (eight) hours as needed for muscle spasms.  08/24/17   [provider]  traZODone (DESYREL) 100 MG tablet Take 100 mg by mouth at bedtime. 07/05/18   [provider]  triamcinolone cream (KENALOG) 0.1 % Apply 1 application topically 2 (two) times daily. 08/04/18   [provider]  venlafaxine XR (EFFEXOR-XR) 150 MG 24 hr capsule Take 2 capsules by mouth daily.  03/19/17   [provider]    Allergies Patient has no known allergies.  History reviewed. No pertinent family history.  Social History Social History   Tobacco Use   Smoking status: Former Smoker    Types: Cigarettes    Last attempt to quit: 02/19/2017    Years since quitting:  2.0   Smokeless tobacco: Never Used  Substance Use Topics   Alcohol use: No   Drug use: No    Review of Systems Constitutional: No fever/chills Eyes: No visual changes. ENT: No sore throat. Cardiovascular: Pain in the left side of her chest radiating from the abdomen Respiratory: No acute shortness of breath, COPD at baseline. Gastrointestinal: Left lower quadrant abdominal pain for 3 months, now worse and radiating up into the left upper chest through the left upper abdomen. Genitourinary: Negative for dysuria. Musculoskeletal: Chronic musculoskeletal pain all throughout her body, but no worse than usual except as described above.   Integumentary: Negative for rash. Neurological: Negative for headaches, focal weakness or numbness.   ____________________________________________   PHYSICAL EXAM:  VITAL SIGNS: ED Triage Vitals  Enc Vitals Group     BP 02/26/19 0040 133/83     Pulse Rate 02/26/19 0040 91     Resp 02/26/19 0040 16     Temp 02/26/19 0040 97.9 F (36.6 C)     Temp Source 02/26/19 0040 Oral     SpO2 02/26/19 0040 95 %     Weight 02/26/19 0037 90.7 kg (200 lb)     Height 02/26/19 0037 1.499 m (4\' 11" )     Head Circumference --      Peak Flow --      Pain Score 02/26/19 0037 7     Pain Loc --      Pain Edu? --      Excl. in [place]? --     Constitutional: Alert and oriented.  Appears anxious and in mild distress. Eyes: Conjunctivae are normal.  Head: Atraumatic. Nose: No congestion/rhinnorhea. Mouth/Throat: Mucous membranes are moist. Neck: No stridor.  No meningeal signs.   Cardiovascular: Normal rate, regular rhythm. Good peripheral circulation. Respiratory: Normal respiratory effort.  No cough observed during my H&P.  Mild expiratory wheezing heard while she is talking but she says this is baseline for her. Gastrointestinal: Obese.  Soft and nontender even on the location in her left lower quadrant that she describes as where the pain is coming from.  She  continued to give her history throughout my abdominal exam. Musculoskeletal: No lower extremity tenderness nor edema. No gross deformities of extremities. Neurologic:  Normal speech and language. No gross focal neurologic deficits are appreciated.  Skin:  Skin is warm, dry and intact. No rash noted. Psychiatric: Mood and affect are anxious but essentially normal.  ____________________________________________   LABS (all labs ordered are listed, but only abnormal results are displayed)  Labs Reviewed  BASIC METABOLIC PANEL - Abnormal; Notable for the following components:      Result Value   Glucose, Bld 155 (*)    BUN 23 (*)    Calcium 8.7 (*)    All other components within normal limits  HEPATIC FUNCTION PANEL - Abnormal; Notable for the following components:   Total Protein 8.4 (*)    All other components within normal limits  CBC  TROPONIN I  LIPASE, BLOOD   ____________________________________________  EKG  ED ECG REPORT #1 I, Hinda Kehr, the attending physician, personally viewed and interpreted this ECG.  Date: 02/26/2019 EKG Time: 00:37 Rate: 86 Rhythm: normal sinus rhythm QRS Axis: normal Intervals: early repolarization, otherwise unremarkable ST/T Wave abnormalities: early repolarization with some minor elevation in inferior leads, but no definitive evidence of ischemia.  Similar to prior ECG in the computer. Narrative Interpretation: no definitive evidence of acute ischemia; does not meet STEMI criteria.  ED ECG REPORT #2 I, Hinda Kehr, the attending physician, personally viewed and interpreted this ECG.  Date: 02/26/2019 EKG Time: 00:39 Rate: 85 Rhythm: normal sinus rhythm QRS Axis: normal Intervals: early repolarization, otherwise unremarkable ST/T Wave abnormalities: early repolarization with some minor elevation in inferior leads, but no definitive evidence of ischemia.  Similar to prior ECG. Narrative Interpretation: no definitive evidence of  acute ischemia; does not meet STEMI criteria. Heavy artiface on this one.  ED ECG REPORT #3 I, Hinda Kehr, the attending physician, personally viewed and interpreted this ECG.  Date: 02/26/2019 EKG Time: 00:43 Rate: 87 Rhythm: normal sinus rhythm QRS Axis: normal Intervals: early repolarization, otherwise unremarkable ST/T Wave abnormalities: early repolarization with some minor elevation in inferior leads, but no definitive evidence of ischemia.  Similar to prior ECG. Narrative Interpretation: no definitive evidence of acute ischemia; does not meet STEMI criteria.  ED ECG REPORT #4 I, Hinda Kehr, the attending physician, personally viewed and interpreted this ECG.  Date: 02/26/2019 EKG Time: 00:44 Rate: 91 Rhythm: normal sinus rhythm QRS Axis: normal Intervals: early repolarization, otherwise unremarkable ST/T Wave abnormalities: early repolarization with some minor elevation in inferior leads, but no definitive evidence of ischemia.  Similar to prior ECG. Narrative Interpretation: no definitive evidence of acute ischemia; does not meet STEMI criteria.   ____________________________________________  RADIOLOGY I, Hinda Kehr, personally viewed and evaluated these images (plain radiographs) as part of my medical decision making, as well as reviewing the written report by the radiologist.  ED MD interpretation: No acute abnormalities, patient has some scarring likely from chronic COPD.  Official radiology report(s): Dg Chest 2 View  Result Date: 02/26/2019 CLINICAL DATA:  Chest pain. EXAM: CHEST - 2 VIEW COMPARISON:  Radiographs and CT 03/28/2017 FINDINGS: The cardiomediastinal contours are normal. Linear right basilar and left mid lung opacities consistent with atelectasis or scarring. Pulmonary vasculature is normal. No confluent consolidation, pleural effusion, or pneumothorax. No acute osseous abnormalities are seen. IMPRESSION: Bilateral atelectasis or scarring.  No acute  findings. Electronically Signed   By: Keith Rake M.D.   On: 02/26/2019 01:19   Ct Angio Chest Pe W/cm &/or Wo Cm  Result Date: 02/26/2019 CLINICAL DATA:  Chest and upper abdominal pain. History of ventral hernia repair. Patient reports pain since the time of surgery. EXAM: CT ANGIOGRAPHY CHEST CT ABDOMEN AND PELVIS WITH CONTRAST TECHNIQUE: Multidetector CT imaging of the chest was performed using the standard protocol during bolus administration  of intravenous contrast. Multiplanar CT image reconstructions and MIPs were obtained to evaluate the vascular anatomy. Multidetector CT imaging of the abdomen and pelvis was performed using the standard protocol during bolus administration of intravenous contrast. CONTRAST:  137mL ISOVUE-370 IOPAMIDOL (ISOVUE-370) INJECTION 76% COMPARISON:  Chest radiograph earlier this day. Abdominal CT 08/17/2018. Chest CT 03/29/2017. Report from high-resolution chest CT 10/27/2018 performed at [facility]: CTA CHEST FINDINGS Cardiovascular: There are no filling defects within the pulmonary arteries to suggest pulmonary embolus. Aortic atherosclerosis without dissection or aneurysm. Upper normal heart size. No pericardial effusion. Mediastinum/Nodes: Subcentimeter mediastinal and right hilar nodes. No mediastinal mass. No visualized thyroid nodule. Esophagus is decompressed. Lungs/Pleura: Linear ground-glass opacities throughout both lungs, similar distribution to prior exam. Many of these opacities have improved, and were also present on 2019 abdominal CT. No acute airspace disease. Calcified right lower lobe granuloma. No pulmonary edema or pleural fluid. Trachea and mainstem bronchi are patent. Musculoskeletal: There are no acute or suspicious osseous abnormalities. Review of the MIP images confirms the above findings. CT ABDOMEN and PELVIS FINDINGS Hepatobiliary: Calcified granuloma. No focal hepatic lesion. Gallbladder physiologically distended, no calcified stone. No  biliary dilatation. Pancreas: No ductal dilatation or inflammation. Spleen: Normal in size without focal abnormality. Adrenals/Urinary Tract: Normal adrenal glands. No hydronephrosis or perinephric edema. Homogeneous renal enhancement with symmetric excretion on delayed phase imaging. Urinary bladder is physiologically distended without wall thickening. Stomach/Bowel: Stomach is within normal limits. Appendix appears normal. No evidence of bowel wall thickening, distention, or inflammatory changes. Appendix not visualized. Moderate colonic stool burden. Vascular/Lymphatic: Mild aortic atherosclerosis. No aneurysm. Portal vein and mesenteric vessels are patent. No enlarged lymph nodes in the abdomen or pelvis. Reproductive: Uterus and bilateral adnexa are unremarkable. Other: Repair of left ventral abdominal wall hernia with tacks in the anterior abdominal wall. No inflammatory change. No recurrent hernia. No free air or free fluid. Musculoskeletal: There are no acute or suspicious osseous abnormalities. Avascular necrosis of bilateral femoral heads, chronic and unchanged. Review of the MIP images confirms the above findings. IMPRESSION: CTA CHEST: 1. No pulmonary embolus. 2. Ground-glass opacities in the lungs suggesting interstitial lung disease. There is been improvement compared to 2018 chest CT. This is been characterized recently on chest CT performed at an outside institution. CT ABDOMEN/PELVIS: 1. Post repair of ventral abdominal wall hernia without complication. No acute findings in the abdomen/pelvis. 2. Moderate diffuse colonic stool burden suggesting constipation. Aortic Atherosclerosis (ICD10-I70.0). Electronically Signed   By: Keith Rake M.D.   On: 02/26/2019 02:52   Ct Abdomen Pelvis W Contrast  Result Date: 02/26/2019 CLINICAL DATA:  Chest and upper abdominal pain. History of ventral hernia repair. Patient reports pain since the time of surgery. EXAM: CT ANGIOGRAPHY CHEST CT ABDOMEN AND  PELVIS WITH CONTRAST TECHNIQUE: Multidetector CT imaging of the chest was performed using the standard protocol during bolus administration of intravenous contrast. Multiplanar CT image reconstructions and MIPs were obtained to evaluate the vascular anatomy. Multidetector CT imaging of the abdomen and pelvis was performed using the standard protocol during bolus administration of intravenous contrast. CONTRAST:  147mL ISOVUE-370 IOPAMIDOL (ISOVUE-370) INJECTION 76% COMPARISON:  Chest radiograph earlier this day. Abdominal CT 08/17/2018. Chest CT 03/29/2017. Report from high-resolution chest CT 10/27/2018 performed at [facility]: CTA CHEST FINDINGS Cardiovascular: There are no filling defects within the pulmonary arteries to suggest pulmonary embolus. Aortic atherosclerosis without dissection or aneurysm. Upper normal heart size. No pericardial effusion. Mediastinum/Nodes: Subcentimeter mediastinal and right hilar nodes. No mediastinal mass. No  visualized thyroid nodule. Esophagus is decompressed. Lungs/Pleura: Linear ground-glass opacities throughout both lungs, similar distribution to prior exam. Many of these opacities have improved, and were also present on 2019 abdominal CT. No acute airspace disease. Calcified right lower lobe granuloma. No pulmonary edema or pleural fluid. Trachea and mainstem bronchi are patent. Musculoskeletal: There are no acute or suspicious osseous abnormalities. Review of the MIP images confirms the above findings. CT ABDOMEN and PELVIS FINDINGS Hepatobiliary: Calcified granuloma. No focal hepatic lesion. Gallbladder physiologically distended, no calcified stone. No biliary dilatation. Pancreas: No ductal dilatation or inflammation. Spleen: Normal in size without focal abnormality. Adrenals/Urinary Tract: Normal adrenal glands. No hydronephrosis or perinephric edema. Homogeneous renal enhancement with symmetric excretion on delayed phase imaging. Urinary bladder is physiologically  distended without wall thickening. Stomach/Bowel: Stomach is within normal limits. Appendix appears normal. No evidence of bowel wall thickening, distention, or inflammatory changes. Appendix not visualized. Moderate colonic stool burden. Vascular/Lymphatic: Mild aortic atherosclerosis. No aneurysm. Portal vein and mesenteric vessels are patent. No enlarged lymph nodes in the abdomen or pelvis. Reproductive: Uterus and bilateral adnexa are unremarkable. Other: Repair of left ventral abdominal wall hernia with tacks in the anterior abdominal wall. No inflammatory change. No recurrent hernia. No free air or free fluid. Musculoskeletal: There are no acute or suspicious osseous abnormalities. Avascular necrosis of bilateral femoral heads, chronic and unchanged. Review of the MIP images confirms the above findings. IMPRESSION: CTA CHEST: 1. No pulmonary embolus. 2. Ground-glass opacities in the lungs suggesting interstitial lung disease. There is been improvement compared to 2018 chest CT. This is been characterized recently on chest CT performed at an outside institution. CT ABDOMEN/PELVIS: 1. Post repair of ventral abdominal wall hernia without complication. No acute findings in the abdomen/pelvis. 2. Moderate diffuse colonic stool burden suggesting constipation. Aortic Atherosclerosis (ICD10-I70.0). Electronically Signed   By: Keith Rake M.D.   On: 02/26/2019 02:52    ____________________________________________   PROCEDURES   Procedure(s) performed (including Critical Care):  Procedures   ____________________________________________   INITIAL IMPRESSION / MDM / [place] / ED COURSE  As part of my medical decision making, I reviewed the following data within the [place] notes reviewed and incorporated, Labs reviewed , EKG interpreted , Old EKG reviewed, Old chart reviewed, Radiograph reviewed , Notes from prior ED visits and [place] Controlled Substance  Database  Kelly Dillon was evaluated in Emergency Department on 02/26/2019 for the symptoms described in the history of present illness. She was evaluated in the context of the global COVID-19 pandemic, which necessitated consideration that the patient might be at risk for infection with the SARS-CoV-2 virus that causes COVID-19. Institutional protocols and algorithms that pertain to the evaluation of patients at risk for COVID-19 are in a state of rapid change based on information released by regulatory bodies including the CDC and federal and state organizations. These policies and algorithms were followed during the patient's care in the ED.      Differential diagnosis includes, but is not limited to, musculoskeletal pain, acute on chronic pain syndrome, complication from prior hernia repair, pulmonary embolism, acute infection such as pneumonia or intra-abdominal infection, renal colic, biliary colic.  The patient has extensive chronic pain syndromes and a relatively reassuring history and that she has been having symptoms for 3 or more months.  However she says the pain became acutely worse recently and it seemed to happen after she either moved or set down a box.  It is possible she  could be having some complication of her hernia.  Additionally, with the pain being at the very top of her upper abdomen and radiating into her chest, I must consider pulmonary embolism.  She is minimally active at baseline as well as having some baseline dyspnea.  She is borderline tachycardic and I feel that she is at potential high risk.  A d-dimer would be of little utility given her age and comorbidities I think it is reasonable to check a CTA chest along with a CT abdomen/pelvis with IV contrast to rule out acute intrathoracic and intra-abdominal emergent medical conditions.  I am giving her a dose of morphine 4 mg IV and Zofran 4 mg IV and will reassess.  Labs are pending but I have low suspicion for ACS and she is  low-risk based on HEART score.  Chest x-ray is pending as well.  She understands and agrees with the plan.  Clinical Course as of Feb 25 306  Tue Feb 26, 2019  0104 Reassuring CBC  CBC [CF]  0127 Troponin I: <0.03 [CF]  0127 Reassuring BMP, no significant abnormalities  Basic metabolic panel(!) [CF]  6283 No acute abnormalities on CXR  DG Chest 2 View [CF]  0224 Reassuring LFTs  Hepatic function panel(!) [CF]  0224 Lipase: 46 [CF]  0256 Reassuring CT scans without any evidence of acute abnormality in the chest, abdomen, nor pelvis.   CT Angio Chest PE W/Cm &/Or Wo Cm [CF]  0306 Patient appeared comfortable on reassessment and was using her phone with no difficulty.  I updated her about the reassuring results of all her testing.  She is surprised that there was nothing acute in her abdomen but understands that the results were reassuring.  I encouraged her to follow-up with her regular doctor or with a pain management doctor like she saw in the past I also gave her the name for Dr. Holley Raring at the [facility] pain management center.  She understands and I gave my usual customary return precautions.  I encouraged her to take her regular pain medicines that she has at home according to the label instructions.   [CF]    Clinical Course User Index [CF] Hinda Kehr, MD    ____________________________________________  FINAL CLINICAL IMPRESSION(S) / ED DIAGNOSES  Final diagnoses:  Left sided abdominal pain  Chronic pain syndrome     MEDICATIONS GIVEN DURING THIS VISIT:  Medications  sodium chloride flush (NS) 0.9 % injection 3 mL (has no administration in time range)  morphine 4 MG/ML injection 4 mg (4 mg Intravenous Given 02/26/19 0156)  ondansetron (ZOFRAN) injection 4 mg (4 mg Intravenous Given 02/26/19 0156)  iopamidol (ISOVUE-370) 76 % injection 100 mL (100 mLs Intravenous Contrast Given 02/26/19 0206)     ED Discharge Orders    None       Note:  This document was prepared  using Dragon voice recognition software and may include unintentional dictation errors.   Hinda Kehr, MD 02/26/19 [phone]

## 2019-02-26 NOTE — Discharge Instructions (Addendum)

## 2019-02-26 NOTE — ED Triage Notes (Signed)
PT stated that she has been having upper abd pain (LUQ) since having a hernia repair in 09/19 and the pain has been moving upwards towards her chest. Pt stated that she also feels SOB at times.

## 2019-08-21 DIAGNOSIS — N398 Other specified disorders of urinary system: Secondary | ICD-10-CM | POA: Insufficient documentation

## 2019-08-21 DIAGNOSIS — R21 Rash and other nonspecific skin eruption: Secondary | ICD-10-CM | POA: Insufficient documentation

## 2019-09-20 HISTORY — PX: ESOPHAGEAL DILATION: SHX303

## 2020-01-16 DIAGNOSIS — Z8719 Personal history of other diseases of the digestive system: Secondary | ICD-10-CM | POA: Insufficient documentation

## 2021-05-17 ENCOUNTER — Encounter: Payer: Self-pay | Admitting: Emergency Medicine

## 2021-05-17 ENCOUNTER — Other Ambulatory Visit: Payer: Self-pay

## 2021-05-17 ENCOUNTER — Emergency Department: Payer: 59

## 2021-05-17 ENCOUNTER — Emergency Department
Admission: EM | Admit: 2021-05-17 | Discharge: 2021-05-17 | Disposition: A | Discharge status: Home / Self Care | Payer: 59 | Attending: Emergency Medicine | Admitting: Emergency Medicine

## 2021-05-17 DIAGNOSIS — I10 Essential (primary) hypertension: Secondary | ICD-10-CM | POA: Insufficient documentation

## 2021-05-17 DIAGNOSIS — S99912A Unspecified injury of left ankle, initial encounter: Secondary | ICD-10-CM | POA: Diagnosis present

## 2021-05-17 DIAGNOSIS — S92152A Displaced avulsion fracture (chip fracture) of left talus, initial encounter for closed fracture: Secondary | ICD-10-CM | POA: Diagnosis not present

## 2021-05-17 DIAGNOSIS — W010XXA Fall on same level from slipping, tripping and stumbling without subsequent striking against object, initial encounter: Secondary | ICD-10-CM | POA: Insufficient documentation

## 2021-05-17 DIAGNOSIS — S80811A Abrasion, right lower leg, initial encounter: Secondary | ICD-10-CM | POA: Diagnosis not present

## 2021-05-17 DIAGNOSIS — J449 Chronic obstructive pulmonary disease, unspecified: Secondary | ICD-10-CM | POA: Diagnosis not present

## 2021-05-17 DIAGNOSIS — Z87891 Personal history of nicotine dependence: Secondary | ICD-10-CM | POA: Insufficient documentation

## 2021-05-17 DIAGNOSIS — Z794 Long term (current) use of insulin: Secondary | ICD-10-CM | POA: Insufficient documentation

## 2021-05-17 DIAGNOSIS — Z7984 Long term (current) use of oral hypoglycemic drugs: Secondary | ICD-10-CM | POA: Diagnosis not present

## 2021-05-17 DIAGNOSIS — Z7951 Long term (current) use of inhaled steroids: Secondary | ICD-10-CM | POA: Diagnosis not present

## 2021-05-17 DIAGNOSIS — E119 Type 2 diabetes mellitus without complications: Secondary | ICD-10-CM | POA: Diagnosis not present

## 2021-05-17 DIAGNOSIS — S82892A Other fracture of left lower leg, initial encounter for closed fracture: Secondary | ICD-10-CM

## 2021-05-17 MED ORDER — IBUPROFEN 600 MG PO TABS
600.0000 mg | ORAL_TABLET | Freq: Once | ORAL | Status: AC
  Administered 2021-05-17: 600 mg via ORAL
  Filled 2021-05-17: qty 1

## 2021-05-17 MED ORDER — BACITRACIN-NEOMYCIN-POLYMYXIN 400-5-5000 EX OINT
TOPICAL_OINTMENT | Freq: Once | CUTANEOUS | Status: AC
  Filled 2021-05-17: qty 1

## 2021-05-17 MED ORDER — OXYCODONE-ACETAMINOPHEN 7.5-325 MG PO TABS: 1.0000 | ORAL_TABLET | Freq: Four times a day (QID) | ORAL | 0 refills | Status: DC | PRN

## 2021-05-17 MED ORDER — NAPROXEN 500 MG PO TABS: 500.0000 mg | ORAL_TABLET | Freq: Two times a day (BID) | ORAL | Status: DC

## 2021-05-17 MED ORDER — OXYCODONE-ACETAMINOPHEN 5-325 MG PO TABS
1.0000 | ORAL_TABLET | Freq: Once | ORAL | Status: AC
  Administered 2021-05-17: 1 via ORAL
  Filled 2021-05-17: qty 1

## 2021-05-17 NOTE — ED Triage Notes (Signed)
Pt tripped and fell last night and injured her right ankle

## 2021-05-17 NOTE — Discharge Instructions (Addendum)
Read and follow discharge care instruction.  Follow orthopedic for definitive evaluation and treatment.  Take medication as directed be advised on drug effects of pain medications.

## 2021-05-17 NOTE — ED Notes (Signed)
See triage note  States she went outside last pm    [facility]   Has swelling and pain to left ankle  Good pulses   Also has large abrasion to lower leg

## 2021-05-17 NOTE — ED Notes (Signed)
Pt given pillow to rest ankle on.

## 2021-05-17 NOTE — ED Provider Notes (Signed)
[facility] Emergency Department Provider Note   ____________________________________________   Event Date/Time   First MD Initiated Contact with Patient 05/17/21 [phone]     (approximate)  I have reviewed the triage vital signs and the nursing notes.   HISTORY  Chief Complaint Ankle Pain and Fall    HPI Kelly Dillon is a 60 y.o. female patient presents with left ankle pain and abrasion to right lower leg secondary to trip and fall last night.  Patient denies LOC or head injury.  Patient rates pain as 8/10.  Described pain as "achy".  No palliative measures prior to arrival.         Past Medical History:  Diagnosis Date   Chronic pain syndrome    extensive - see problem list   COPD (chronic obstructive pulmonary disease) ([place])    Diabetes mellitus without complication ([place])    Fibromyalgia    went to [facility] for monthly lidocaine infusions   Hypertension     Patient Active Problem List   Diagnosis Date Noted   Abdominal pain 08/17/2018   Ventral hernia, recurrent 08/15/2018   Chronic generalized abdominal pain (Primary Area of Pain) 02/21/2018   Chronic ankle pain, bilateral (Secondary Area of Pain) (L>R) 02/21/2018   Chronic pain of both knees (Tertiary Area of Pain) (L>R) 02/21/2018   Chronic bilateral low back pain with bilateral sciatica (Fourth Area of Pain) (L>R) 02/21/2018   Chronic neck pain(midline) 02/21/2018   Chronic pain of both shoulders(L>R) 02/21/2018   Chronic upper extremity pain (L>R) 02/21/2018   Chronic pain of both lower extremities (L>R) 02/21/2018   Chronic pain syndrome 02/21/2018   Long term current use of opiate analgesic 02/21/2018   Pharmacologic therapy 02/21/2018   Disorder of skeletal system 02/21/2018   Problems influencing health status 02/21/2018   Morbid obesity with BMI of 40.0-44.9, adult ([place]) 09/28/2017   Shortness of breath 08/28/2017   High risk medication use 06/15/2017   Multiple joint  pain 01/24/2017   Hernia of abdominal wall 01/12/2017   Sacroiliitis ([place]) 10/06/2016   Colon polyp 06/11/2016   BMI 39.0-39.9,adult 02/18/2016   Excessive daytime sleepiness 11/11/2015   Avascular necrosis of bones of both hips ([place]) 04/20/2015   Mixed stress and urge urinary incontinence 12/09/2014   Controlled type 2 diabetes mellitus without complication ([place]) 27/04/2375   Chronic, continuous use of opioids 03/13/2013   DDD (degenerative disc disease), lumbar 03/13/2013   Suicide attempt ([place]) 03/13/2013   Recurrent ventral hernia 08/28/2012   Moderate COPD (chronic obstructive pulmonary disease) ([place]) 07/11/2012   Tobacco abuse 03/01/2012   Depression, major, recurrent, moderate ([place]) 07/21/2011   Diverticulosis 07/21/2011   Fibromyalgia 07/21/2011    Past Surgical History:  Procedure Laterality Date   APPENDECTOMY     HERNIA REPAIR     TUBAL LIGATION     VENTRAL HERNIA REPAIR N/A 08/15/2018   Procedure: HERNIA REPAIR VENTRAL ADULT;  Surgeon: Benjamine Sprague, DO;  Location: [facility] ORS;  Service: General;  Laterality: N/A;   VENTRAL HERNIA REPAIR N/A 08/18/2018   Procedure: LAPAROSCOPIC VENTRAL HERNIA;  Surgeon: Benjamine Sprague, DO;  Location: [facility] ORS;  Service: General;  Laterality: N/A;    Prior to Admission medications   Medication Sig Start Date End Date Taking? Authorizing Provider  naproxen (NAPROSYN) 500 MG tablet Take 1 tablet (500 mg total) by mouth 2 (two) times daily with a meal. 05/17/21  Yes Sable Feil, PA-C  oxyCODONE-acetaminophen (PERCOCET) 7.5-325 MG tablet Take 1  tablet by mouth every 6 (six) hours as needed for severe pain. 05/17/21  Yes Sable Feil, PA-C  ADVAIR DISKUS 250-50 MCG/DOSE AEPB Inhale 1 puff into the lungs 2 (two) times daily. 01/19/17   [provider]  albuterol (PROVENTIL HFA;VENTOLIN HFA) 108 (90 Base) MCG/ACT inhaler Inhale 2 puffs into the lungs every 6 (six) hours as needed for wheezing or shortness of breath. 03/28/17   Darel Hong, MD  albuterol (PROVENTIL) (2.5 MG/3ML) 0.083% nebulizer solution Inhale 3 mLs into the lungs every 6 (six) hours as needed. 08/04/18   [provider]  atorvastatin (LIPITOR) 40 MG tablet Take 1 tablet by mouth daily. 11/07/17 11/07/18  [provider]  ATROVENT HFA 17 MCG/ACT inhaler Inhale 2 puffs into the lungs every 6 (six) hours as needed for wheezing.  08/04/18   [provider]  COMBIVENT RESPIMAT 20-100 MCG/ACT AERS respimat Inhale 1 puff into the lungs 2 (two) times daily.  08/04/18   [provider]  gabapentin (NEURONTIN) 300 MG capsule Take 300 mg by mouth 3 (three) times daily. 07/05/18   [provider]  insulin glargine (LANTUS) 100 unit/mL SOPN Inject 0.15 mLs (15 Units total) into the skin at bedtime. 04/01/17   Hillary Bow, MD  insulin lispro (HUMALOG KWIKPEN) 100 UNIT/ML KiwkPen Inject 0.15 mLs (15 Units total) into the skin daily with breakfast. 04/01/17   Hillary Bow, MD  ipratropium-albuterol (DUONEB) 0.5-2.5 (3) MG/3ML SOLN Take 3 mLs by nebulization QID. 04/01/17   Sudini, Srikar, MD  LYRICA 200 MG capsule Take 1 capsule by mouth 3 (three) times daily as needed. 02/22/17   [provider]  metFORMIN (GLUCOPHAGE) 1000 MG tablet Take 1 tablet by mouth 2 (two) times daily. 01/12/17   [provider]  methocarbamol (ROBAXIN) 500 MG tablet Take 500 mg by mouth 3 (three) times daily. 08/04/18   [provider]  nicotine (NICODERM CQ - DOSED IN MG/24 HOURS) 14 mg/24hr patch Place 1 patch (14 mg total) onto the skin daily. 08/16/18   Lysle Pearl, Isami, DO  omeprazole (PRILOSEC) 20 MG capsule Take 20 mg by mouth daily. 07/05/18   [provider]  Spacer/Aero Chamber Mouthpiece MISC 1 Units by Does not apply route every 4 (four) hours as needed (wheezing). 03/28/17   Darel Hong, MD  tiZANidine (ZANAFLEX) 2 MG tablet Take 1 tablet by mouth every 8 (eight) hours as needed for muscle spasms.  08/24/17   [provider]  traZODone (DESYREL) 100 MG tablet Take 100 mg by mouth at bedtime. 07/05/18   [provider]  triamcinolone cream (KENALOG) 0.1 % Apply 1 application topically 2 (two) times daily. 08/04/18   [provider]  venlafaxine XR (EFFEXOR-XR) 150 MG 24 hr capsule Take 2 capsules by mouth daily.  03/19/17   [provider]    Allergies Patient has no known allergies.  No family history on file.  Social History Social History   Tobacco Use   Smoking status: Former    Pack years: 0.00    Types: Cigarettes    Quit date: 02/19/2017    Years since quitting: 4.2   Smokeless tobacco: Never  Substance Use Topics   Alcohol use: No   Drug use: No    Review of Systems  Constitutional: No fever/chills Eyes: No visual changes. ENT: No sore throat. Cardiovascular: Denies chest pain. Respiratory: Denies shortness of breath. Gastrointestinal: No abdominal pain.  No nausea, no vomiting.  No diarrhea.  No constipation. Genitourinary:  Negative for dysuria. Musculoskeletal: Left lateral ankle pain. Skin: Negative for rash.  Abrasion right lower leg. Neurological: Negative for headaches, focal weakness or numbness. Endocrine: Diabetes and hypertension  ____________________________________________   PHYSICAL EXAM:  VITAL SIGNS: ED Triage Vitals  Enc Vitals Group     BP 05/17/21 0832 (!) 121/99     Pulse Rate 05/17/21 0832 80     Resp 05/17/21 0832 16     Temp 05/17/21 0832 97.9 F (36.6 C)     Temp src --      SpO2 05/17/21 0832 97 %     Weight 05/17/21 0831 199 lb 15.3 oz (90.7 kg)     Height 05/17/21 0831 4\' 11"  (1.499 m)     Head Circumference --      Peak Flow --      Pain Score 05/17/21 0830 8     Pain Loc --      Pain Edu? --      Excl. in [place]? --     Constitutional: Alert and oriented. Well appearing and in no acute distress. Eyes: Conjunctivae are normal. PERRL. EOMI. Head: Atraumatic. Nose: No congestion/rhinnorhea. Mouth/Throat:  Mucous membranes are moist.  Oropharynx non-erythematous. Neck:*No cervical spine tenderness to palpation. Cardiovascular: Normal rate, regular rhythm. Grossly normal heart sounds.  Good peripheral circulation. Respiratory: Normal respiratory effort.  No retractions. Lungs CTAB. Gastrointestinal: Soft and nontender.  Normal distention. No abdominal bruits. No CVA tenderness. Genitourinary: Deferred Musculoskeletal: No obvious deformity to the left ankle.  Mild edema to the lateral aspect of the left ankle.   Neurologic:  Normal speech and language. No gross focal neurologic deficits are appreciated. No gait instability. Skin:  Skin is warm, dry and intact. No rash noted.  Abrasion distal third of the right leg. Psychiatric: Mood and affect are normal. Speech and behavior are normal.  ____________________________________________   LABS (all labs ordered are listed, but only abnormal results are displayed)  Labs Reviewed - No data to display ____________________________________________  EKG   ____________________________________________  RADIOLOGY I, Sable Feil, personally viewed and evaluated these images (plain radiographs) as part of my medical decision making, as well as reviewing the written report by the radiologist.  ED MD interpretation: Small avulsion fracture distal left fibula area.  Official radiology report(s): DG Ankle Complete Left  Result Date: 05/17/2021 CLINICAL DATA:  Twisting injury yesterday with lateral ankle pain, initial encounter EXAM: LEFT ANKLE COMPLETE - 3+ VIEW COMPARISON:  None. FINDINGS: Considerable soft tissue swelling is noted laterally. A few tiny bony densities are noted adjacent to the distal tip of the fibula consistent with small avulsions. No other fracture is seen. IMPRESSION: Small avulsion fractures from the distal aspect of the fibula with associated soft tissue swelling. Electronically Signed   By: Inez Catalina M.D.   On: 05/17/2021 09:02     ____________________________________________   PROCEDURES  Procedure(s) performed (including Critical Care):  Procedures   ____________________________________________   INITIAL IMPRESSION / ASSESSMENT AND PLAN / ED COURSE  As part of my medical decision making, I reviewed the following data within the [place]         Patient presents with left ankle pain secondary to trip and fall.  Patient also sustained abrasion to the right lower leg.  Discussed x-ray findings patient should assess for small avulsion fracture to the lateral left ankle.  Patient placed in a cam walker and given discharge care instructions.  Patient follow-up with orthopedic for definitive evaluation and  treatment.      ____________________________________________   FINAL CLINICAL IMPRESSION(S) / ED DIAGNOSES  Final diagnoses:  Avulsion fracture of left ankle, closed, initial encounter     ED Discharge Orders          Ordered    oxyCODONE-acetaminophen (PERCOCET) 7.5-325 MG tablet  Every 6 hours PRN        05/17/21 0951    naproxen (NAPROSYN) 500 MG tablet  2 times daily with meals        05/17/21 0951             Note:  This document was prepared using Dragon voice recognition software and may include unintentional dictation errors.    Sable Feil, PA-C 05/17/21 8250    Carrie Mew, MD 05/18/21 [phone]

## 2021-08-26 ENCOUNTER — Emergency Department: Payer: Medicare Other

## 2021-08-26 ENCOUNTER — Encounter: Payer: Self-pay | Admitting: Emergency Medicine

## 2021-08-26 ENCOUNTER — Ambulatory Visit: Payer: 59 | Admitting: Adult Health

## 2021-08-26 ENCOUNTER — Inpatient Hospital Stay
Admission: EM | Admit: 2021-08-26 | Discharge: 2021-08-30 | DRG: 871 | Disposition: A | Discharge status: Home Health | Payer: Medicare Other | Attending: Internal Medicine | Admitting: Internal Medicine

## 2021-08-26 ENCOUNTER — Other Ambulatory Visit: Payer: Self-pay

## 2021-08-26 DIAGNOSIS — M797 Fibromyalgia: Secondary | ICD-10-CM | POA: Diagnosis present

## 2021-08-26 DIAGNOSIS — J189 Pneumonia, unspecified organism: Secondary | ICD-10-CM | POA: Diagnosis present

## 2021-08-26 DIAGNOSIS — R6520 Severe sepsis without septic shock: Secondary | ICD-10-CM

## 2021-08-26 DIAGNOSIS — J4521 Mild intermittent asthma with (acute) exacerbation: Secondary | ICD-10-CM | POA: Diagnosis present

## 2021-08-26 DIAGNOSIS — G894 Chronic pain syndrome: Secondary | ICD-10-CM | POA: Diagnosis present

## 2021-08-26 DIAGNOSIS — R509 Fever, unspecified: Secondary | ICD-10-CM | POA: Diagnosis present

## 2021-08-26 DIAGNOSIS — E1165 Type 2 diabetes mellitus with hyperglycemia: Secondary | ICD-10-CM

## 2021-08-26 DIAGNOSIS — Z87891 Personal history of nicotine dependence: Secondary | ICD-10-CM | POA: Diagnosis not present

## 2021-08-26 DIAGNOSIS — J9601 Acute respiratory failure with hypoxia: Secondary | ICD-10-CM

## 2021-08-26 DIAGNOSIS — I10 Essential (primary) hypertension: Secondary | ICD-10-CM | POA: Diagnosis present

## 2021-08-26 DIAGNOSIS — Z9851 Tubal ligation status: Secondary | ICD-10-CM | POA: Diagnosis not present

## 2021-08-26 DIAGNOSIS — A419 Sepsis, unspecified organism: Secondary | ICD-10-CM

## 2021-08-26 DIAGNOSIS — E876 Hypokalemia: Secondary | ICD-10-CM

## 2021-08-26 DIAGNOSIS — F32A Depression, unspecified: Secondary | ICD-10-CM | POA: Diagnosis present

## 2021-08-26 DIAGNOSIS — J9621 Acute and chronic respiratory failure with hypoxia: Secondary | ICD-10-CM

## 2021-08-26 DIAGNOSIS — Z20822 Contact with and (suspected) exposure to covid-19: Secondary | ICD-10-CM | POA: Diagnosis present

## 2021-08-26 DIAGNOSIS — Z9049 Acquired absence of other specified parts of digestive tract: Secondary | ICD-10-CM

## 2021-08-26 DIAGNOSIS — J45909 Unspecified asthma, uncomplicated: Secondary | ICD-10-CM | POA: Diagnosis not present

## 2021-08-26 DIAGNOSIS — J449 Chronic obstructive pulmonary disease, unspecified: Secondary | ICD-10-CM | POA: Diagnosis present

## 2021-08-26 DIAGNOSIS — Z9151 Personal history of suicidal behavior: Secondary | ICD-10-CM

## 2021-08-26 DIAGNOSIS — Z79899 Other long term (current) drug therapy: Secondary | ICD-10-CM

## 2021-08-26 DIAGNOSIS — Z5989 Other problems related to housing and economic circumstances: Secondary | ICD-10-CM | POA: Diagnosis not present

## 2021-08-26 DIAGNOSIS — Z23 Encounter for immunization: Secondary | ICD-10-CM

## 2021-08-26 DIAGNOSIS — Z794 Long term (current) use of insulin: Secondary | ICD-10-CM | POA: Diagnosis not present

## 2021-08-26 DIAGNOSIS — E119 Type 2 diabetes mellitus without complications: Secondary | ICD-10-CM | POA: Diagnosis present

## 2021-08-26 DIAGNOSIS — J4540 Moderate persistent asthma, uncomplicated: Secondary | ICD-10-CM

## 2021-08-26 DIAGNOSIS — Z6841 Body Mass Index (BMI) 40.0 and over, adult: Secondary | ICD-10-CM

## 2021-08-26 HISTORY — DX: Sepsis, unspecified organism: A41.9

## 2021-08-26 LAB — PROTIME-INR
INR: 1.2 (ref 0.8–1.2)
Prothrombin Time: 15.3 seconds — ABNORMAL HIGH (ref 11.4–15.2)

## 2021-08-26 LAB — CBC WITH DIFFERENTIAL/PLATELET
Abs Immature Granulocytes: 0.12 10*3/uL — ABNORMAL HIGH (ref 0.00–0.07)
Basophils Absolute: 0 10*3/uL (ref 0.0–0.1)
Basophils Relative: 0 %
Eosinophils Absolute: 0 10*3/uL (ref 0.0–0.5)
Eosinophils Relative: 0 %
HCT: 38.3 % (ref 36.0–46.0)
Hemoglobin: 13.7 g/dL (ref 12.0–15.0)
Immature Granulocytes: 1 %
Lymphocytes Relative: 10 %
Lymphs Abs: 1.6 10*3/uL (ref 0.7–4.0)
MCH: 32.4 pg (ref 26.0–34.0)
MCHC: 35.8 g/dL (ref 30.0–36.0)
MCV: 90.5 fL (ref 80.0–100.0)
Monocytes Absolute: 1 10*3/uL (ref 0.1–1.0)
Monocytes Relative: 6 %
Neutro Abs: 13.2 10*3/uL — ABNORMAL HIGH (ref 1.7–7.7)
Neutrophils Relative %: 83 %
Platelets: 263 10*3/uL (ref 150–400)
RBC: 4.23 MIL/uL (ref 3.87–5.11)
RDW: 13 % (ref 11.5–15.5)
WBC: 15.9 10*3/uL — ABNORMAL HIGH (ref 4.0–10.5)
nRBC: 0 % (ref 0.0–0.2)

## 2021-08-26 LAB — COMPREHENSIVE METABOLIC PANEL
ALT: 19 U/L (ref 0–44)
AST: 30 U/L (ref 15–41)
Albumin: 3.8 g/dL (ref 3.5–5.0)
Alkaline Phosphatase: 104 U/L (ref 38–126)
Anion gap: 14 (ref 5–15)
BUN: 7 mg/dL (ref 6–20)
CO2: 24 mmol/L (ref 22–32)
Calcium: 9.2 mg/dL (ref 8.9–10.3)
Chloride: 94 mmol/L — ABNORMAL LOW (ref 98–111)
Creatinine, Ser: 0.65 mg/dL (ref 0.44–1.00)
GFR, Estimated: >60 mL/min (ref >60)
Glucose, Bld: 155 mg/dL — ABNORMAL HIGH (ref 70–99)
Potassium: 2.8 mmol/L — ABNORMAL LOW (ref 3.5–5.1)
Sodium: 132 mmol/L — ABNORMAL LOW (ref 135–145)
Total Bilirubin: 1.3 mg/dL — ABNORMAL HIGH (ref 0.3–1.2)
Total Protein: 9 g/dL — ABNORMAL HIGH (ref 6.5–8.1)

## 2021-08-26 LAB — LACTIC ACID, PLASMA: Lactic Acid, Venous: 1.3 mmol/L (ref 0.5–1.9)

## 2021-08-26 LAB — PROCALCITONIN: Procalcitonin: 0.19 ng/mL

## 2021-08-26 LAB — RESP PANEL BY RT-PCR (FLU A&B, COVID) ARPGX2
Influenza A by PCR: NEGATIVE
Influenza B by PCR: NEGATIVE
SARS Coronavirus 2 by RT PCR: NEGATIVE

## 2021-08-26 MED ORDER — INSULIN ASPART 100 UNIT/ML IJ SOLN
0.0000 [IU] | Freq: Three times a day (TID) | INTRAMUSCULAR | Status: DC
  Administered 2021-08-28 (×2): 2 [IU] via SUBCUTANEOUS
  Administered 2021-08-28: 5 [IU] via SUBCUTANEOUS
  Administered 2021-08-29: 2 [IU] via SUBCUTANEOUS
  Administered 2021-08-29: 3 [IU] via SUBCUTANEOUS
  Administered 2021-08-29: 2 [IU] via SUBCUTANEOUS
  Administered 2021-08-30: 1 [IU] via SUBCUTANEOUS
  Filled 2021-08-26 (×6): qty 1

## 2021-08-26 MED ORDER — ACETAMINOPHEN 325 MG PO TABS
650.0000 mg | ORAL_TABLET | Freq: Four times a day (QID) | ORAL | Status: DC | PRN
  Administered 2021-08-27 – 2021-08-28 (×4): 650 mg via ORAL
  Filled 2021-08-26 (×4): qty 2

## 2021-08-26 MED ORDER — SODIUM CHLORIDE 0.9 % IV SOLN
500.0000 mg | INTRAVENOUS | Status: DC
  Administered 2021-08-26 – 2021-08-29 (×4): 500 mg via INTRAVENOUS
  Filled 2021-08-26 (×5): qty 500

## 2021-08-26 MED ORDER — POTASSIUM CHLORIDE CRYS ER 20 MEQ PO TBCR
40.0000 meq | EXTENDED_RELEASE_TABLET | Freq: Once | ORAL | Status: AC
  Administered 2021-08-27: 40 meq via ORAL
  Filled 2021-08-26: qty 2

## 2021-08-26 MED ORDER — IPRATROPIUM-ALBUTEROL 0.5-2.5 (3) MG/3ML IN SOLN
3.0000 mL | Freq: Four times a day (QID) | RESPIRATORY_TRACT | Status: DC | PRN
  Administered 2021-08-27: 3 mL via RESPIRATORY_TRACT
  Filled 2021-08-26: qty 3

## 2021-08-26 MED ORDER — SODIUM CHLORIDE 0.9 % IV BOLUS (SEPSIS)
1500.0000 mL | Freq: Once | INTRAVENOUS | Status: AC
  Administered 2021-08-26: 1500 mL via INTRAVENOUS

## 2021-08-26 MED ORDER — ENOXAPARIN SODIUM 60 MG/0.6ML IJ SOSY
0.5000 mg/kg | PREFILLED_SYRINGE | INTRAMUSCULAR | Status: DC
  Administered 2021-08-27 – 2021-08-30 (×4): 45 mg via SUBCUTANEOUS
  Filled 2021-08-26 (×4): qty 0.6

## 2021-08-26 MED ORDER — ONDANSETRON HCL 4 MG/2ML IJ SOLN
4.0000 mg | Freq: Four times a day (QID) | INTRAMUSCULAR | Status: DC | PRN
  Administered 2021-08-27 – 2021-08-28 (×5): 4 mg via INTRAVENOUS
  Filled 2021-08-26 (×5): qty 2

## 2021-08-26 MED ORDER — SODIUM CHLORIDE 0.9 % IV SOLN: INTRAVENOUS | Status: DC

## 2021-08-26 MED ORDER — SODIUM CHLORIDE 0.9 % IV SOLN
2.0000 g | INTRAVENOUS | Status: DC
  Administered 2021-08-26 – 2021-08-29 (×4): 2 g via INTRAVENOUS
  Filled 2021-08-26: qty 20
  Filled 2021-08-26: qty 2
  Filled 2021-08-26 (×2): qty 20
  Filled 2021-08-26: qty 2

## 2021-08-26 MED ORDER — ACETAMINOPHEN 325 MG PO TABS
650.0000 mg | ORAL_TABLET | Freq: Once | ORAL | Status: AC | PRN
  Administered 2021-08-26: 650 mg via ORAL
  Filled 2021-08-26: qty 2

## 2021-08-26 NOTE — ED Provider Notes (Signed)
[facility] Emergency Department Provider Note  ____________________________________________  Time seen: Approximately 11:33 PM  I have reviewed the triage vital signs and the nursing notes.   HISTORY  Chief Complaint Shortness of breath  HPI Kelly Dillon is a 60 y.o. female , with a history of diabetes, hypertension, fibromyalgia, COPD on 3 L nasal cannula chronically who comes ED complaining of diarrhea and worsening generalized weakness for the past 2 weeks.  She is also increasingly short of breath with productive cough.  Also has occasional vomiting, chills and severe fatigue.  Symptoms are constant, waxing and waning, worsening, no aggravating or alleviating factors.    Past Medical History:  Diagnosis Date   Chronic pain syndrome    extensive - see problem list   COPD (chronic obstructive pulmonary disease) ([place])    Diabetes mellitus without complication ([place])    Fibromyalgia    went to [facility] for monthly lidocaine infusions   Hypertension      Patient Active Problem List   Diagnosis Date Noted   Abdominal pain 08/17/2018   Ventral hernia, recurrent 08/15/2018   Chronic generalized abdominal pain (Primary Area of Pain) 02/21/2018   Chronic ankle pain, bilateral (Secondary Area of Pain) (L>R) 02/21/2018   Chronic pain of both knees (Tertiary Area of Pain) (L>R) 02/21/2018   Chronic bilateral low back pain with bilateral sciatica (Fourth Area of Pain) (L>R) 02/21/2018   Chronic neck pain(midline) 02/21/2018   Chronic pain of both shoulders(L>R) 02/21/2018   Chronic upper extremity pain (L>R) 02/21/2018   Chronic pain of both lower extremities (L>R) 02/21/2018   Chronic pain syndrome 02/21/2018   Long term current use of opiate analgesic 02/21/2018   Pharmacologic therapy 02/21/2018   Disorder of skeletal system 02/21/2018   Problems influencing health status 02/21/2018   Morbid obesity with BMI of 40.0-44.9, adult ([place]) 09/28/2017    Shortness of breath 08/28/2017   High risk medication use 06/15/2017   Multiple joint pain 01/24/2017   Hernia of abdominal wall 01/12/2017   Sacroiliitis ([place]) 10/06/2016   Colon polyp 06/11/2016   BMI 39.0-39.9,adult 02/18/2016   Excessive daytime sleepiness 11/11/2015   Avascular necrosis of bones of both hips ([place]) 04/20/2015   Mixed stress and urge urinary incontinence 12/09/2014   Controlled type 2 diabetes mellitus without complication ([place]) 11/23/7251   Chronic, continuous use of opioids 03/13/2013   DDD (degenerative disc disease), lumbar 03/13/2013   Suicide attempt ([place]) 03/13/2013   Recurrent ventral hernia 08/28/2012   Moderate COPD (chronic obstructive pulmonary disease) ([place]) 07/11/2012   Tobacco abuse 03/01/2012   Depression, major, recurrent, moderate ([place]) 07/21/2011   Diverticulosis 07/21/2011   Fibromyalgia 07/21/2011     Past Surgical History:  Procedure Laterality Date   APPENDECTOMY     HERNIA REPAIR     TUBAL LIGATION     VENTRAL HERNIA REPAIR N/A 08/15/2018   Procedure: HERNIA REPAIR VENTRAL ADULT;  Surgeon: Benjamine Sprague, DO;  Location: [facility] ORS;  Service: General;  Laterality: N/A;   VENTRAL HERNIA REPAIR N/A 08/18/2018   Procedure: LAPAROSCOPIC VENTRAL HERNIA;  Surgeon: Benjamine Sprague, DO;  Location: [facility] ORS;  Service: General;  Laterality: N/A;     Prior to Admission medications   Medication Sig Start Date End Date Taking? Authorizing Provider  ADVAIR DISKUS 250-50 MCG/DOSE AEPB Inhale 1 puff into the lungs 2 (two) times daily. Patient not taking: Reported on 08/26/2021 01/19/17   [provider]  albuterol (PROVENTIL HFA;VENTOLIN HFA) 108 (90 Base) MCG/ACT  inhaler Inhale 2 puffs into the lungs every 6 (six) hours as needed for wheezing or shortness of breath. Patient not taking: Reported on 08/26/2021 03/28/17   Darel Hong, MD  albuterol (PROVENTIL) (2.5 MG/3ML) 0.083% nebulizer solution Inhale 3 mLs into the lungs every 6 (six) hours as  needed. Patient not taking: Reported on 08/26/2021 08/04/18   [provider]  atorvastatin (LIPITOR) 40 MG tablet Take 1 tablet by mouth daily. 11/07/17 11/07/18  [provider]  ATROVENT HFA 17 MCG/ACT inhaler Inhale 2 puffs into the lungs every 6 (six) hours as needed for wheezing.  Patient not taking: Reported on 08/26/2021 08/04/18   [provider]  COMBIVENT RESPIMAT 20-100 MCG/ACT AERS respimat Inhale 1 puff into the lungs 2 (two) times daily.  Patient not taking: Reported on 08/26/2021 08/04/18   [provider]  gabapentin (NEURONTIN) 300 MG capsule Take 300 mg by mouth 3 (three) times daily. Patient not taking: Reported on 08/26/2021 07/05/18   [provider]  insulin glargine (LANTUS) 100 unit/mL SOPN Inject 0.15 mLs (15 Units total) into the skin at bedtime. Patient not taking: Reported on 08/26/2021 04/01/17   Hillary Bow, MD  insulin lispro (HUMALOG KWIKPEN) 100 UNIT/ML KiwkPen Inject 0.15 mLs (15 Units total) into the skin daily with breakfast. Patient not taking: Reported on 08/26/2021 04/01/17   Hillary Bow, MD  ipratropium-albuterol (DUONEB) 0.5-2.5 (3) MG/3ML SOLN Take 3 mLs by nebulization QID. Patient not taking: Reported on 08/26/2021 04/01/17   Hillary Bow, MD  LYRICA 200 MG capsule Take 1 capsule by mouth 3 (three) times daily as needed. Patient not taking: Reported on 08/26/2021 02/22/17   [provider]  metFORMIN (GLUCOPHAGE) 1000 MG tablet Take 1 tablet by mouth 2 (two) times daily. Patient not taking: Reported on 08/26/2021 01/12/17   [provider]  methocarbamol (ROBAXIN) 500 MG tablet Take 500 mg by mouth 3 (three) times daily. Patient not taking: Reported on 08/26/2021 08/04/18   [provider]  naproxen (NAPROSYN) 500 MG tablet Take 1 tablet (500 mg total) by mouth 2 (two) times daily with a meal. Patient not taking: Reported on 08/26/2021 05/17/21   Sable Feil, PA-C  nicotine (NICODERM CQ -  DOSED IN MG/24 HOURS) 14 mg/24hr patch Place 1 patch (14 mg total) onto the skin daily. Patient not taking: Reported on 08/26/2021 08/16/18   Benjamine Sprague, DO  omeprazole (PRILOSEC) 20 MG capsule Take 20 mg by mouth daily. Patient not taking: Reported on 08/26/2021 07/05/18   [provider]  oxyCODONE-acetaminophen (PERCOCET) 7.5-325 MG tablet Take 1 tablet by mouth every 6 (six) hours as needed for severe pain. Patient not taking: Reported on 08/26/2021 05/17/21   Sable Feil, PA-C  Spacer/Aero Chamber Mouthpiece MISC 1 Units by Does not apply route every 4 (four) hours as needed (wheezing). Patient not taking: Reported on 08/26/2021 03/28/17   Darel Hong, MD  tiZANidine (ZANAFLEX) 2 MG tablet Take 1 tablet by mouth every 8 (eight) hours as needed for muscle spasms.  Patient not taking: Reported on 08/26/2021 08/24/17   [provider]  traZODone (DESYREL) 100 MG tablet Take 100 mg by mouth at bedtime. Patient not taking: Reported on 08/26/2021 07/05/18   [provider]  triamcinolone cream (KENALOG) 0.1 % Apply 1 application topically 2 (two) times daily. Patient not taking: Reported on 08/26/2021 08/04/18   [provider]  venlafaxine XR (EFFEXOR-XR) 150 MG 24 hr capsule Take 2 capsules by mouth daily.  Patient not  taking: Reported on 08/26/2021 03/19/17   [provider]     Allergies Patient has no known allergies.   History reviewed. No pertinent family history.  Social History Social History   Tobacco Use   Smoking status: Former    Types: Cigarettes    Quit date: 02/19/2017    Years since quitting: 4.5   Smokeless tobacco: Never  Substance Use Topics   Alcohol use: No   Drug use: No    Review of Systems  Constitutional:   Positive fever and chills.  ENT:   No sore throat. No rhinorrhea. Cardiovascular:   No chest pain or syncope. Respiratory:   Positive shortness of breath and productive cough. Gastrointestinal:   Negative for  abdominal pain, positive vomiting and diarrhea.  Musculoskeletal:   Negative for focal pain or swelling All other systems reviewed and are negative except as documented above in ROS and HPI.  ____________________________________________   PHYSICAL EXAM:  VITAL SIGNS: ED Triage Vitals  Enc Vitals Group     BP 08/26/21 2105 (!) 152/88     Pulse Rate 08/26/21 2105 (!) 133     Resp 08/26/21 2105 (!) 28     Temp 08/26/21 2105 (!) 102.5 F (39.2 C)     Temp Source 08/26/21 2105 Oral     SpO2 08/26/21 2105 91 %     Weight 08/26/21 2129 199 lb 15.3 oz (90.7 kg)     Height 08/26/21 2129 4\' 11"  (1.499 m)     Head Circumference --      Peak Flow --      Pain Score 08/26/21 2113 8     Pain Loc --      Pain Edu? --      Excl. in [place]? --     Vital signs reviewed, nursing assessments reviewed.   Constitutional:   Alert and oriented.  Ill-appearing. Eyes:   Conjunctivae are normal. EOMI. PERRL. ENT      Head:   Normocephalic and atraumatic.      Nose:   Normal.      Mouth/Throat:   Dry mucous membranes .      Neck:   No meningismus. Full ROM. Hematological/Lymphatic/Immunilogical:   No cervical lymphadenopathy. Cardiovascular:   Tachycardia heart rate 130. Symmetric bilateral radial and DP pulses.  No murmurs. Cap refill less than 2 seconds. Respiratory:   Tachypnea with a respiratory rate of 30.  Rhonchorous breath sounds in the right base. Gastrointestinal:   Soft and nontender. Non distended. There is no CVA tenderness.  No rebound, rigidity, or guarding. Genitourinary:   deferred Musculoskeletal:   Normal range of motion in all extremities. No joint effusions.  No lower extremity tenderness.  No edema. Neurologic:   Normal speech and language.  Motor grossly intact. No acute focal neurologic deficits are appreciated.  Skin:    Skin is warm, dry and intact. No rash noted.  No petechiae, purpura, or bullae.  ____________________________________________    LABS (pertinent  positives/negatives) (all labs ordered are listed, but only abnormal results are displayed) Labs Reviewed  COMPREHENSIVE METABOLIC PANEL - Abnormal; Notable for the following components:      Result Value   Sodium 132 (*)    Potassium 2.8 (*)    Chloride 94 (*)    Glucose, Bld 155 (*)    Total Protein 9.0 (*)    Total Bilirubin 1.3 (*)    All other components within normal limits  CBC WITH DIFFERENTIAL/PLATELET - Abnormal; Notable for  the following components:   WBC 15.9 (*)    Neutro Abs 13.2 (*)    Abs Immature Granulocytes 0.12 (*)    All other components within normal limits  PROTIME-INR - Abnormal; Notable for the following components:   Prothrombin Time 15.3 (*)    All other components within normal limits  RESP PANEL BY RT-PCR (FLU A&B, COVID) ARPGX2  CULTURE, BLOOD (ROUTINE X 2)  CULTURE, BLOOD (ROUTINE X 2)  LACTIC ACID, PLASMA  PROCALCITONIN  LACTIC ACID, PLASMA  URINALYSIS, COMPLETE (UACMP) WITH MICROSCOPIC   ____________________________________________   EKG  Interpreted by me Sinus tachycardia rate 130.  Normal axis, normal intervals.  Poor R wave progression.  Normal ST segments and T waves.  ____________________________________________    TOIZTIWPY  DG Chest 1 View  Result Date: 08/26/2021 CLINICAL DATA:  Increased vomiting and chills with weakness, initial encounter EXAM: PORTABLE CHEST 1 VIEW COMPARISON:  02/26/2019 FINDINGS: Cardiac shadow is stable. The lungs are well aerated bilaterally. Bilateral airspace opacities are noted in the bases consistent with multifocal pneumonia. No sizable effusion is seen. No bony abnormality is noted. IMPRESSION: Changes of multifocal pneumonia in the bases bilaterally. Electronically Signed   By: Inez Catalina M.D.   On: 08/26/2021 21:55    ____________________________________________   PROCEDURES .Critical Care Performed by: Carrie Mew, MD Authorized by: Carrie Mew, MD   Critical care provider  statement:    Critical care time (minutes):  33   Critical care time was exclusive of:  Separately billable procedures and treating other patients   Critical care was necessary to treat or prevent imminent or life-threatening deterioration of the following conditions:  Sepsis and dehydration   Critical care was time spent personally by me on the following activities:  Development of treatment plan with patient or surrogate, discussions with consultants, evaluation of patient's response to treatment, examination of patient, obtaining history from patient or surrogate, ordering and performing treatments and interventions, ordering and review of laboratory studies, ordering and review of radiographic studies, pulse oximetry, re-evaluation of patient's condition and review of old charts  ____________________________________________  DIFFERENTIAL DIAGNOSIS   Pneumonia, UTI, sepsis, anemia, electrolyte abnormality, dehydration, viral illness  CLINICAL IMPRESSION / ASSESSMENT AND PLAN / ED COURSE  Medications ordered in the ED: Medications  cefTRIAXone (ROCEPHIN) 2 g in sodium chloride 0.9 % 100 mL IVPB (0 g Intravenous Stopped 08/26/21 2217)  azithromycin (ZITHROMAX) 500 mg in sodium chloride 0.9 % 250 mL IVPB (500 mg Intravenous New Bag/Given 08/26/21 2219)  sodium chloride 0.9 % bolus 1,500 mL (1,500 mLs Intravenous New Bag/Given 08/26/21 2151)  acetaminophen (TYLENOL) tablet 650 mg (650 mg Oral Given 08/26/21 2135)    Pertinent labs & imaging results that were available during my care of the patient were reviewed by me and considered in my medical decision making (see chart for details).  Kelly Dillon was evaluated in Emergency Department on 08/26/2021 for the symptoms described in the history of present illness. She was evaluated in the context of the global COVID-19 pandemic, which necessitated consideration that the patient might be at risk for infection with the SARS-CoV-2 virus that causes  COVID-19. Institutional protocols and algorithms that pertain to the evaluation of patients at risk for COVID-19 are in a state of rapid change based on information released by regulatory bodies including the CDC and federal and state organizations. These policies and algorithms were followed during the patient's care in the ED.   Patient presents with shortness of breath cough and fever  tachycardia and tachypnea.  Oxygen saturation is adequate but borderline.  Suspicion for pneumonia and sepsis after initial assessment.  IV fluids, broad lab panel, ceftriaxone and azithromycin ordered.  Chest x-ray viewed and interpreted by me and does show right lower lung infiltrate consistent with pneumonia.  Radiology report reviewed.  Labs [place] hypokalemia, leukocytosis.  Lactate is normal.  COVID and flu negative, urinalysis normal.  Case discussed with hospitalist for further management.      ____________________________________________   FINAL CLINICAL IMPRESSION(S) / ED DIAGNOSES    Final diagnoses:  Community acquired pneumonia of right lower lobe of lung  Sepsis without acute organ dysfunction, due to unspecified organism [facility])     ED Discharge Orders     None       Portions of this note were generated with dragon dictation software. Dictation errors may occur despite best attempts at proofreading.    Carrie Mew, MD 08/27/21 [phone]

## 2021-08-26 NOTE — H&P (Signed)
History and Physical    Kelly Dillon FXT:[id number] DOB: 27-Oct-1961 DOA: 08/26/2021  PCP: Pcp, No  Patient coming from: Home  I have personally briefly reviewed patient's old medical records in [place]  Chief Complaint: Dyspnea, fever, persistent nausea vomiting  HPI: Kelly Dillon is a 60 y.o. 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  Patient moved from [place] to [place] to live with her daughter in May.  Since then she has lost her Medicare and Medicaid and did not have her daily 3 L of oxygen.  Also has not been able to afford any of her previous medications including insulin.  For the past 2 weeks, she has been having nausea and vomiting and could not keep anything down.  Vomitus was sometimes blood-tinged.  Denies abdominal pain.  Has fever daily up to 102F.  Has been short of breath daily at rest and with exertion with coughing.  Also in pain since she used to be on opioids for her fibromyalgia but has only been able to take Tylenol.  States each night she takes Tylenol and hopes that God takes her away.  She was not able to see a physician in these 2 weeks for her symptoms and she cannot afford it financially without insurance.  ED Course: She was febrile up to 102.60F, tachycardic up to heart rate of 133, tachypneic with RR of 37 and initially placed on 2 L.  Has leukocytosis of 15.9, hemoglobin of 13.7.  Lactate of 1.3.  Pro-Cal of 0.19.  Sodium of 132, potassium of 2.8, chloride of 94, creatinine of 0.65, BG of 153.  Negative COVID and flu PCR. Chest x-ray revealed multifocal pneumonia.  Review of Systems: Constitutional: No Weight Change, + Fever ENT/Mouth: No sore throat, No Rhinorrhea Eyes: No Eye Pain, No Vision Changes Cardiovascular: No Chest Pain, + SOB, No PND, +  Dyspnea on Exertion, No Orthopnea, No Edema, No Palpitations Respiratory: + Cough, + Sputum, No Wheezing, +Dyspnea  Gastrointestinal: + Nausea, + Vomiting, No Diarrhea, No Constipation, No Pain Genitourinary: no Urinary Incontinence, No Urgency, No Flank Pain Musculoskeletal: No Arthralgias, No Myalgias Skin: No Skin Lesions, No Pruritus, Neuro: +Weakness, No Numbness Psych: No Anxiety/Panic, No Depression, no decrease appetite Heme/Lymph: No Bruising, No Bleeding  Past Medical History:  Diagnosis Date   Chronic pain syndrome    extensive - see problem list   COPD (chronic obstructive pulmonary disease) (HCC)    Diabetes mellitus without complication (HCC)    Fibromyalgia    went to [facility] for monthly lidocaine infusions   Hypertension     Past Surgical History:  Procedure Laterality Date   APPENDECTOMY     HERNIA REPAIR     TUBAL LIGATION     VENTRAL HERNIA REPAIR N/A 08/15/2018   Procedure: HERNIA REPAIR VENTRAL ADULT;  Surgeon: Benjamine Sprague, DO;  Location: [facility] ORS;  Service: General;  Laterality: N/A;   VENTRAL HERNIA REPAIR N/A 08/18/2018   Procedure: LAPAROSCOPIC VENTRAL HERNIA;  Surgeon: Benjamine Sprague, DO;  Location: [facility] ORS;  Service: General;  Laterality: N/A;     reports that she quit smoking about 4 years ago. Her smoking use included cigarettes. She has never used smokeless tobacco. She reports that she does not drink alcohol and does not use drugs. Social History  No Known Allergies  History reviewed. No pertinent  family history.   Prior to Admission medications   Medication Sig Start Date End Date Taking? Authorizing Provider  ADVAIR DISKUS 250-50 MCG/DOSE AEPB Inhale 1 puff into the lungs 2 (two) times daily. Patient not taking: Reported on 08/26/2021 01/19/17   [provider]  albuterol (PROVENTIL HFA;VENTOLIN HFA) 108 (90 Base) MCG/ACT inhaler Inhale 2 puffs into the lungs every 6 (six) hours as needed for wheezing or shortness of  breath. Patient not taking: Reported on 08/26/2021 03/28/17   Darel Hong, MD  albuterol (PROVENTIL) (2.5 MG/3ML) 0.083% nebulizer solution Inhale 3 mLs into the lungs every 6 (six) hours as needed. Patient not taking: Reported on 08/26/2021 08/04/18   [provider]  atorvastatin (LIPITOR) 40 MG tablet Take 1 tablet by mouth daily. 11/07/17 11/07/18  [provider]  ATROVENT HFA 17 MCG/ACT inhaler Inhale 2 puffs into the lungs every 6 (six) hours as needed for wheezing.  Patient not taking: Reported on 08/26/2021 08/04/18   [provider]  COMBIVENT RESPIMAT 20-100 MCG/ACT AERS respimat Inhale 1 puff into the lungs 2 (two) times daily.  Patient not taking: Reported on 08/26/2021 08/04/18   [provider]  gabapentin (NEURONTIN) 300 MG capsule Take 300 mg by mouth 3 (three) times daily. Patient not taking: Reported on 08/26/2021 07/05/18   [provider]  insulin glargine (LANTUS) 100 unit/mL SOPN Inject 0.15 mLs (15 Units total) into the skin at bedtime. Patient not taking: Reported on 08/26/2021 04/01/17   Hillary Bow, MD  insulin lispro (HUMALOG KWIKPEN) 100 UNIT/ML KiwkPen Inject 0.15 mLs (15 Units total) into the skin daily with breakfast. Patient not taking: Reported on 08/26/2021 04/01/17   Hillary Bow, MD  ipratropium-albuterol (DUONEB) 0.5-2.5 (3) MG/3ML SOLN Take 3 mLs by nebulization QID. Patient not taking: Reported on 08/26/2021 04/01/17   Hillary Bow, MD  LYRICA 200 MG capsule Take 1 capsule by mouth 3 (three) times daily as needed. Patient not taking: Reported on 08/26/2021 02/22/17   [provider]  metFORMIN (GLUCOPHAGE) 1000 MG tablet Take 1 tablet by mouth 2 (two) times daily. Patient not taking: Reported on 08/26/2021 01/12/17   [provider]  methocarbamol (ROBAXIN) 500 MG tablet Take 500 mg by mouth 3 (three) times daily. Patient not taking: Reported on 08/26/2021 08/04/18   [provider]  naproxen  (NAPROSYN) 500 MG tablet Take 1 tablet (500 mg total) by mouth 2 (two) times daily with a meal. Patient not taking: Reported on 08/26/2021 05/17/21   Sable Feil, PA-C  nicotine (NICODERM CQ - DOSED IN MG/24 HOURS) 14 mg/24hr patch Place 1 patch (14 mg total) onto the skin daily. Patient not taking: Reported on 08/26/2021 08/16/18   Benjamine Sprague, DO  omeprazole (PRILOSEC) 20 MG capsule Take 20 mg by mouth daily. Patient not taking: Reported on 08/26/2021 07/05/18   [provider]  oxyCODONE-acetaminophen (PERCOCET) 7.5-325 MG tablet Take 1 tablet by mouth every 6 (six) hours as needed for severe pain. Patient not taking: Reported on 08/26/2021 05/17/21   Sable Feil, PA-C  Spacer/Aero Chamber Mouthpiece MISC 1 Units by Does not apply route every 4 (four) hours as needed (wheezing). Patient not taking: Reported on 08/26/2021 03/28/17   Darel Hong, MD  tiZANidine (ZANAFLEX) 2 MG tablet Take 1 tablet by mouth every 8 (eight) hours as needed for muscle spasms.  Patient not taking: Reported on 08/26/2021 08/24/17   [provider]  traZODone (DESYREL) 100 MG tablet Take 100 mg by mouth at bedtime.  Patient not taking: Reported on 08/26/2021 07/05/18   [provider]  triamcinolone cream (KENALOG) 0.1 % Apply 1 application topically 2 (two) times daily. Patient not taking: Reported on 08/26/2021 08/04/18   [provider]  venlafaxine XR (EFFEXOR-XR) 150 MG 24 hr capsule Take 2 capsules by mouth daily.  Patient not taking: Reported on 08/26/2021 03/19/17   [provider]    Physical Exam: Vitals:   08/26/21 2129 08/26/21 2130 08/26/21 2200 08/26/21 2230  BP:  140/64 126/85 132/81  Pulse:  (!) 128 (!) 116 (!) 116  Resp:  (!) 37 (!) 34 (!) 31  Temp:      TempSrc:      SpO2:  97% 97% 99%  Weight: 90.7 kg     Height: 4\' 11"  (1.499 m)       Constitutional: NAD, diaphoretic ill-appearing obese female laying at approximately 20 degree incline in  bed Vitals:   08/26/21 2129 08/26/21 2130 08/26/21 2200 08/26/21 2230  BP:  140/64 126/85 132/81  Pulse:  (!) 128 (!) 116 (!) 116  Resp:  (!) 37 (!) 34 (!) 31  Temp:      TempSrc:      SpO2:  97% 97% 99%  Weight: 90.7 kg     Height: 4\' 11"  (1.499 m)      Eyes: PERRL, lids and conjunctivae normal ENMT: Mucous membranes are moist.  Respiratory: clear to auscultation bilaterally, no wheezing, no crackles.  Labored respiration when speaking and at rest.  Worse with exertion.  On 2 L via nasal cannula Cardiovascular: Regular rate and rhythm, no murmurs / rubs / gallops. No extremity edema.  Abdomen: no tenderness, no masses palpated. Bowel sounds positive.  Musculoskeletal: no clubbing / cyanosis. No joint deformity upper and lower extremities. Good ROM, no contractures. Normal muscle tone.  Skin: no rashes, lesions, ulcers. No induration Neurologic: CN 2-12 grossly intact. Sensation intact,  Strength 5/5 in all 4.  Psychiatric: Normal judgment and insight. Alert and oriented x 3. tearful mood.    Labs on Admission: I have personally reviewed following labs and imaging studies  CBC: Recent Labs  Lab 08/26/21 2118  WBC 15.9*  NEUTROABS 13.2*  HGB 13.7  HCT 38.3  MCV 90.5  PLT 341   Basic Metabolic Panel: Recent Labs  Lab 08/26/21 2118  NA 132*  K 2.8*  CL 94*  CO2 24  GLUCOSE 155*  BUN 7  CREATININE 0.65  CALCIUM 9.2   GFR: Estimated Creatinine Clearance: 75.3 mL/min (by C-G formula based on SCr of 0.65 mg/dL). Liver Function Tests: Recent Labs  Lab 08/26/21 2118  AST 30  ALT 19  ALKPHOS 104  BILITOT 1.3*  PROT 9.0*  ALBUMIN 3.8   No results for input(s): LIPASE, AMYLASE in the last 168 hours. No results for input(s): AMMONIA in the last 168 hours. Coagulation Profile: Recent Labs  Lab 08/26/21 2118  INR 1.2   Cardiac Enzymes: No results for input(s): CKTOTAL, CKMB, CKMBINDEX, TROPONINI in the last 168 hours. BNP (last 3 results) No results for  input(s): PROBNP in the last 8760 hours. HbA1C: No results for input(s): HGBA1C in the last 72 hours. CBG: No results for input(s): GLUCAP in the last 168 hours. Lipid Profile: No results for input(s): CHOL, HDL, LDLCALC, TRIG, CHOLHDL, LDLDIRECT in the last 72 hours. Thyroid Function Tests: No results for input(s): TSH, T4TOTAL, FREET4, T3FREE, THYROIDAB in the last 72 hours. Anemia Panel: No results for input(s): VITAMINB12, FOLATE, FERRITIN, TIBC, IRON,  RETICCTPCT in the last 72 hours. Urine analysis:    Component Value Date/Time   COLORURINE YELLOW (A) 08/14/2018 1935   APPEARANCEUR CLEAR (A) 08/14/2018 1935   LABSPEC 1.020 08/14/2018 1935   PHURINE 5.0 08/14/2018 1935   GLUCOSEU NEGATIVE 08/14/2018 1935   HGBUR SMALL (A) 08/14/2018 1935   BILIRUBINUR NEGATIVE 08/14/2018 1935   KETONESUR NEGATIVE 08/14/2018 1935   PROTEINUR NEGATIVE 08/14/2018 1935   NITRITE NEGATIVE 08/14/2018 1935   LEUKOCYTESUR NEGATIVE 08/14/2018 1935    Radiological Exams on Admission: DG Chest 1 View  Result Date: 08/26/2021 CLINICAL DATA:  Increased vomiting and chills with weakness, initial encounter EXAM: PORTABLE CHEST 1 VIEW COMPARISON:  02/26/2019 FINDINGS: Cardiac shadow is stable. The lungs are well aerated bilaterally. Bilateral airspace opacities are noted in the bases consistent with multifocal pneumonia. No sizable effusion is seen. No bony abnormality is noted. IMPRESSION: Changes of multifocal pneumonia in the bases bilaterally. Electronically Signed   By: Inez Catalina M.D.   On: 08/26/2021 21:55      Assessment/Plan  Sepsis secondary to multifocal pneumonia - Patient presented with fever, tachycardia and tachypnea with multifocal pneumonia seen on chest x-ray - Continue IV normal saline fluid - Continue Rocephin and azithromycin  Acute on chronic hypoxemic respiratory failure secondary to multifocal pneumonia -Patient normally at baseline on 3 L via nasal cannula but has been without  oxygen for 5 months since moving due to loss of insurance -She is admitted on 3 L - Continue antibiotic treatment as stated above  Hypokalemia - Replete with oral potassium and IV fluids  COPD/asthma with chronic hypoxemia - Not in acute exacerbation - As needed DuoNebs q6hr   HTN -controlled without antihypertensives  Type 2 DM -previously insulin dependent but has been unable to afford it -check HbA1C and start sensitive sliding scale with meal  Fibromyalgia - Recommends establishing with PCP with help with chronic pain management -TOCM consult   Social determinants pt on disability and needs medicare and medicaid to afford meds and O2. TOCM consulted.   DVT prophylaxis:.Lovenox Code Status: Full Family Communication: Plan discussed with patient at bedside  disposition Plan: Home with at least 2 midnight stays  Consults called:  Admission status: inpatient  Level of care: Med-Surg  Status is: Inpatient  Remains inpatient appropriate because:Inpatient level of care appropriate due to severity of illness  Dispo: The patient is from: Home              Anticipated d/c is to: Home              Patient currently is not medically stable to d/c.   Difficult to place patient No         Orene Desanctis DO [facility]   If 7PM-7AM, please contact night-coverage [email or web address]   08/26/2021, 11:59 PM

## 2021-08-26 NOTE — ED Triage Notes (Signed)
Pt's family member reports that patient has not been feeling well x 2 weeks, reports that patient recently received approval for her medicare. Pt's family member reports increased vomiting x 2 weeks, chills, generalized weakness, fever, reports that patient had negative covid test. Pt's family member reports that patient recently last her medicaid approval and lost her home O2, pt is chronically on 2-3L via [place] at home, on arrival pt 90% on RA, on 2L 93-94%. Pt also noted to be febrile on arrival, increased RR. Pt also c/o pain with deep breathing, respirations shallow and tachypneic on arrival to ED. Pt's family member reports that patient has been out of all of her home medications, pt with noted strong cough in triage.

## 2021-08-27 ENCOUNTER — Encounter: Payer: Self-pay | Admitting: Family Medicine

## 2021-08-27 DIAGNOSIS — J9621 Acute and chronic respiratory failure with hypoxia: Secondary | ICD-10-CM

## 2021-08-27 DIAGNOSIS — J449 Chronic obstructive pulmonary disease, unspecified: Secondary | ICD-10-CM

## 2021-08-27 DIAGNOSIS — R6520 Severe sepsis without septic shock: Secondary | ICD-10-CM

## 2021-08-27 DIAGNOSIS — J189 Pneumonia, unspecified organism: Secondary | ICD-10-CM

## 2021-08-27 DIAGNOSIS — J4540 Moderate persistent asthma, uncomplicated: Secondary | ICD-10-CM | POA: Insufficient documentation

## 2021-08-27 DIAGNOSIS — J45909 Unspecified asthma, uncomplicated: Secondary | ICD-10-CM

## 2021-08-27 DIAGNOSIS — Z794 Long term (current) use of insulin: Secondary | ICD-10-CM

## 2021-08-27 DIAGNOSIS — A419 Sepsis, unspecified organism: Principal | ICD-10-CM

## 2021-08-27 DIAGNOSIS — E876 Hypokalemia: Secondary | ICD-10-CM

## 2021-08-27 DIAGNOSIS — J9601 Acute respiratory failure with hypoxia: Secondary | ICD-10-CM

## 2021-08-27 DIAGNOSIS — E119 Type 2 diabetes mellitus without complications: Secondary | ICD-10-CM

## 2021-08-27 HISTORY — DX: Acute and chronic respiratory failure with hypoxia: J96.21

## 2021-08-27 HISTORY — DX: Pneumonia, unspecified organism: J18.9

## 2021-08-27 LAB — URINALYSIS, COMPLETE (UACMP) WITH MICROSCOPIC
Bilirubin Urine: NEGATIVE
Glucose, UA: NEGATIVE mg/dL
Ketones, ur: NEGATIVE mg/dL
Leukocytes,Ua: NEGATIVE
Nitrite: NEGATIVE
Protein, ur: NEGATIVE mg/dL
Specific Gravity, Urine: 1.004 — ABNORMAL LOW (ref 1.005–1.030)
pH: 6 (ref 5.0–8.0)

## 2021-08-27 LAB — HEMOGLOBIN A1C
Hgb A1c MFr Bld: 5.9 % — ABNORMAL HIGH (ref 4.8–5.6)
Mean Plasma Glucose: 123 mg/dL

## 2021-08-27 LAB — CBC
HCT: 32.9 % — ABNORMAL LOW (ref 36.0–46.0)
Hemoglobin: 11.5 g/dL — ABNORMAL LOW (ref 12.0–15.0)
MCH: 32.1 pg (ref 26.0–34.0)
MCHC: 35 g/dL (ref 30.0–36.0)
MCV: 91.9 fL (ref 80.0–100.0)
Platelets: 208 10*3/uL (ref 150–400)
RBC: 3.58 MIL/uL — ABNORMAL LOW (ref 3.87–5.11)
RDW: 13.2 % (ref 11.5–15.5)
WBC: 10.9 10*3/uL — ABNORMAL HIGH (ref 4.0–10.5)
nRBC: 0 % (ref 0.0–0.2)

## 2021-08-27 LAB — GLUCOSE, CAPILLARY
Glucose-Capillary: 114 mg/dL — ABNORMAL HIGH (ref 70–99)
Glucose-Capillary: 121 mg/dL — ABNORMAL HIGH (ref 70–99)

## 2021-08-27 LAB — BASIC METABOLIC PANEL
Anion gap: 9 (ref 5–15)
BUN: 5 mg/dL — ABNORMAL LOW (ref 6–20)
CO2: 24 mmol/L (ref 22–32)
Calcium: 8 mg/dL — ABNORMAL LOW (ref 8.9–10.3)
Chloride: 102 mmol/L (ref 98–111)
Creatinine, Ser: 0.51 mg/dL (ref 0.44–1.00)
GFR, Estimated: >60 mL/min (ref >60)
Glucose, Bld: 139 mg/dL — ABNORMAL HIGH (ref 70–99)
Potassium: 2.8 mmol/L — ABNORMAL LOW (ref 3.5–5.1)
Sodium: 135 mmol/L (ref 135–145)

## 2021-08-27 LAB — MAGNESIUM: Magnesium: 1.8 mg/dL (ref 1.7–2.4)

## 2021-08-27 LAB — CBG MONITORING, ED
Glucose-Capillary: 94 mg/dL (ref 70–99)
Glucose-Capillary: 98 mg/dL (ref 70–99)
Glucose-Capillary: 112 mg/dL — ABNORMAL HIGH (ref 70–99)

## 2021-08-27 MED ORDER — POTASSIUM CHLORIDE 10 MEQ/100ML IV SOLN
10.0000 meq | INTRAVENOUS | Status: AC
  Administered 2021-08-27 (×6): 10 meq via INTRAVENOUS
  Filled 2021-08-27 (×6): qty 100

## 2021-08-27 MED ORDER — GUAIFENESIN-CODEINE 100-10 MG/5ML PO SOLN
10.0000 mL | ORAL | Status: DC | PRN
  Administered 2021-08-27 – 2021-08-28 (×7): 10 mL via ORAL
  Filled 2021-08-27 (×8): qty 10

## 2021-08-27 MED ORDER — METHYLPREDNISOLONE SODIUM SUCC 40 MG IJ SOLR
40.0000 mg | Freq: Two times a day (BID) | INTRAMUSCULAR | Status: DC
  Administered 2021-08-27 – 2021-08-28 (×3): 40 mg via INTRAVENOUS
  Filled 2021-08-27 (×3): qty 1

## 2021-08-27 MED ORDER — MAGNESIUM SULFATE 2 GM/50ML IV SOLN
2.0000 g | Freq: Once | INTRAVENOUS | Status: AC
  Administered 2021-08-27: 2 g via INTRAVENOUS
  Filled 2021-08-27: qty 50

## 2021-08-27 MED ORDER — POTASSIUM CHLORIDE CRYS ER 20 MEQ PO TBCR
40.0000 meq | EXTENDED_RELEASE_TABLET | Freq: Two times a day (BID) | ORAL | Status: AC
  Administered 2021-08-27 (×2): 40 meq via ORAL
  Filled 2021-08-27 (×2): qty 2

## 2021-08-27 MED ORDER — BENZONATATE 100 MG PO CAPS
200.0000 mg | ORAL_CAPSULE | Freq: Three times a day (TID) | ORAL | Status: DC | PRN
  Administered 2021-08-27 – 2021-08-29 (×6): 200 mg via ORAL
  Filled 2021-08-27 (×7): qty 2

## 2021-08-27 MED ORDER — INFLUENZA VAC SPLIT QUAD 0.5 ML IM SUSY
0.5000 mL | PREFILLED_SYRINGE | INTRAMUSCULAR | Status: AC
  Administered 2021-08-28: 0.5 mL via INTRAMUSCULAR
  Filled 2021-08-27: qty 0.5

## 2021-08-27 MED ORDER — IPRATROPIUM-ALBUTEROL 0.5-2.5 (3) MG/3ML IN SOLN
3.0000 mL | RESPIRATORY_TRACT | Status: DC
  Administered 2021-08-27 – 2021-08-30 (×19): 3 mL via RESPIRATORY_TRACT
  Filled 2021-08-27 (×19): qty 3

## 2021-08-27 NOTE — Progress Notes (Signed)
PHARMACIST - PHYSICIAN COMMUNICATION  CONCERNING:  Enoxaparin (Lovenox) for DVT Prophylaxis    RECOMMENDATION: Patient was prescribed enoxaprin 40mg  q24 hours for VTE prophylaxis.   Filed Weights   08/26/21 2129  Weight: 90.7 kg (199 lb 15.3 oz)    Body mass index is 40.39 kg/m.  Estimated Creatinine Clearance: 75.3 mL/min (by C-G formula based on SCr of 0.65 mg/dL).   Based on [facility] patient is candidate for enoxaparin 0.5mg /kg TBW SQ every 24 hours based on BMI being >30.   DESCRIPTION: Pharmacy has adjusted enoxaparin dose per [facility] policy.  Patient is now receiving enoxaparin 0.5 mg/kg every 24 hours   Renda Rolls, PharmD, [facility] 08/27/2021 12:01 AM

## 2021-08-27 NOTE — ED Notes (Signed)
Nikki RN aware of assigned bed 

## 2021-08-27 NOTE — Progress Notes (Signed)
PROGRESS NOTE    Kelly Dillon  HYI:[id number] DOB: 11/22/1960 DOA: 08/26/2021 PCP: Pcp, No   Brief Narrative: Taken from H&P. Kelly Dillon is a 60 y.o. 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   Patient moved from [place] to [place] to live with her daughter in May.  Since then she has lost her Medicare and Medicaid and did not have her daily 3 L of oxygen.  Also has not been able to afford any of her previous medications including insulin.  For the past 2 weeks, she has been having nausea and vomiting and could not keep anything down. She was found to be febrile at 102 on presentation.  She met sepsis criteria with fever, tachycardia, tachypnea and leukocytosis.  Chest x-ray concerning for multifocal pneumonia.  COVID-19 and flu PCR came back negative.  Procalcitonin at 0.19.  Patient will need home oxygen on discharge.  Subjective: Patient continues to have some cough and shortness of breath when seen today.  Stating that she is feeling little improved as compared to yesterday.  Nausea and vomiting improved.  Assessment & Plan:   Principal Problem:   Sepsis ([place]) Active Problems:   Moderate COPD (chronic obstructive pulmonary disease) (HCC)   Fibromyalgia   Multifocal pneumonia   Acute on chronic respiratory failure with hypoxia (HCC)   Hypokalemia   Asthma   Type 2 diabetes mellitus ([place])  Sepsis secondary to multifocal pneumonia.  Currently stable on her baseline oxygen requirement of 3 L.  Received IV hydration in ED. -Continue with Rocephin and Zithromax. -Continue with supportive care -Continue with supplemental oxygen  Hypokalemia.  Persistent hypokalemia with potassium of 2.8.  Magnesium at 1.8. -Replete potassium and monitor -Give her some magnesium  Chronic hypoxic  respiratory failure with COPD/asthma with mild exacerbation.  There was some wheezing today.  Saturating well on her baseline oxygen requirement of 3 L.  Patient was not using home oxygen for the past couple of months. -Make DuoNeb as scheduled -Albuterol nebulizer as needed -Continue with supplemental oxygen  Hypertension.  Currently not taking any meds and blood pressure on softer side of normal. -Continue to monitor  Type 2 diabetes mellitus.  Apparently she was on insulin before but unable to afford it due to recent move.  Per patient she has now her Medicare and should be able to continue with her medications. -A1c-pending -Continue with SSI  History of fibromyalgia. -Patient needs to get established with PCP to help with chronic pain management. -TOC consult was placed  Objective: Vitals:   08/27/21 0739 08/27/21 0830 08/27/21 0900 08/27/21 1300  BP: 94/69 (!) 93/57 (!) 106/56 116/73  Pulse: 77 73 75 90  Resp: (!) 32 (!) 30 (!) 34 (!) 23  Temp: 97.8 F (36.6 C)     TempSrc: Oral     SpO2: 99% 98% 100% 99%  Weight:      Height:        Intake/Output Summary (Last 24 hours) at 08/27/2021 1344 Last data filed at 08/26/2021 2217 Gross per 24 hour  Intake 100 ml  Output --  Net 100 ml   Filed Weights   08/26/21 2129  Weight: 90.7 kg    Examination:  General exam: Appears calm and comfortable  Respiratory system: Few scattered wheeze and rhonchi, mildly tachypneic with mildly increased work of breathing. Cardiovascular system: S1 &  S2 heard, RRR.  Gastrointestinal system: Soft, nontender, nondistended, bowel sounds positive. Central nervous system: Alert and oriented. No focal neurological deficits. Extremities: No edema, no cyanosis, pulses intact and symmetrical. Psychiatry: Judgement and insight appear normal. Mood & affect appropriate.    DVT prophylaxis: Lovenox Code Status: Full Family Communication: Discussed with patient Disposition Plan:  Status is:  Inpatient  Remains inpatient appropriate because:Inpatient level of care appropriate due to severity of illness  Dispo: The patient is from: Home              Anticipated d/c is to: Home              Patient currently is not medically stable to d/c.   Difficult to place patient No              Level of care: Med-Surg  All the records are reviewed and case discussed with Care Management/Social Worker. Management plans discussed with the patient, nursing and they are in agreement.  Consultants:  None  Procedures:  Antimicrobials:  Ceftriaxone Zithromax  Data Reviewed: I have personally reviewed following labs and imaging studies  CBC: Recent Labs  Lab 08/26/21 2118 08/27/21 0633  WBC 15.9* 10.9*  NEUTROABS 13.2*  --   HGB 13.7 11.5*  HCT 38.3 32.9*  MCV 90.5 91.9  PLT 263 481   Basic Metabolic Panel: Recent Labs  Lab 08/26/21 2118 08/27/21 0633  NA 132* 135  K 2.8* 2.8*  CL 94* 102  CO2 24 24  GLUCOSE 155* 139*  BUN 7 5*  CREATININE 0.65 0.51  CALCIUM 9.2 8.0*  MG  --  1.8   GFR: Estimated Creatinine Clearance: 75.3 mL/min (by C-G formula based on SCr of 0.51 mg/dL). Liver Function Tests: Recent Labs  Lab 08/26/21 2118  AST 30  ALT 19  ALKPHOS 104  BILITOT 1.3*  PROT 9.0*  ALBUMIN 3.8   No results for input(s): LIPASE, AMYLASE in the last 168 hours. No results for input(s): AMMONIA in the last 168 hours. Coagulation Profile: Recent Labs  Lab 08/26/21 2118  INR 1.2   Cardiac Enzymes: No results for input(s): CKTOTAL, CKMB, CKMBINDEX, TROPONINI in the last 168 hours. BNP (last 3 results) No results for input(s): PROBNP in the last 8760 hours. HbA1C: No results for input(s): HGBA1C in the last 72 hours. CBG: Recent Labs  Lab 08/27/21 0552 08/27/21 0843 08/27/21 1229  GLUCAP 94 98 112*   Lipid Profile: No results for input(s): CHOL, HDL, LDLCALC, TRIG, CHOLHDL, LDLDIRECT in the last 72 hours. Thyroid Function Tests: No results for  input(s): TSH, T4TOTAL, FREET4, T3FREE, THYROIDAB in the last 72 hours. Anemia Panel: No results for input(s): VITAMINB12, FOLATE, FERRITIN, TIBC, IRON, RETICCTPCT in the last 72 hours. Sepsis Labs: Recent Labs  Lab 08/26/21 2118  PROCALCITON 0.19  LATICACIDVEN 1.3    Recent Results (from the past 240 hour(s))  Culture, blood (Routine x 2)     Status: None (Preliminary result)   Collection Time: 08/26/21  9:18 PM   Specimen: BLOOD  Result Value Ref Range Status   Specimen Description BLOOD RIGHT WRIST  Final   Special Requests   Final    BOTTLES DRAWN AEROBIC AND ANAEROBIC Blood Culture adequate volume   Culture   Final    NO GROWTH < 12 HOURS Performed at [facility], [address], [place], [place] [address]    Report Status PENDING  Incomplete  Culture, blood (Routine x 2)     Status:  None (Preliminary result)   Collection Time: 08/26/21  9:23 PM   Specimen: BLOOD  Result Value Ref Range Status   Specimen Description BLOOD RIGHT ANTECUBITAL  Final   Special Requests   Final    BOTTLES DRAWN AEROBIC AND ANAEROBIC Blood Culture adequate volume   Culture   Final    NO GROWTH < 12 HOURS Performed at [facility], [address] [place], [place] [address]    Report Status PENDING  Incomplete  Resp Panel by RT-PCR (Flu A&B, Covid) Nasopharyngeal Swab     Status: None   Collection Time: 08/26/21  9:40 PM   Specimen: Nasopharyngeal Swab; Nasopharyngeal(NP) swabs in vial transport medium  Result Value Ref Range Status   SARS Coronavirus 2 by RT PCR NEGATIVE NEGATIVE Final    Comment: (NOTE) SARS-CoV-2 target nucleic acids are NOT DETECTED.  The SARS-CoV-2 RNA is generally detectable in upper respiratory specimens during the acute phase of infection. The lowest concentration of SARS-CoV-2 viral copies this assay can detect is 138 copies/mL. A negative result does not preclude SARS-Cov-2 infection and should not be used as the sole basis for treatment  or other patient management decisions. A negative result may occur with  improper specimen collection/handling, submission of specimen other than nasopharyngeal swab, presence of viral mutation(s) within the areas targeted by this assay, and inadequate number of viral copies(<138 copies/mL). A negative result must be combined with clinical observations, patient history, and epidemiological information. The expected result is Negative.  Fact Sheet for Patients:  EntrepreneurPulse.com.au  Fact Sheet for Healthcare Providers:  IncredibleEmployment.be  This test is no t yet approved or cleared by the [place] FDA and  has been authorized for detection and/or diagnosis of SARS-CoV-2 by FDA under an Emergency Use Authorization (EUA). This EUA will remain  in effect (meaning this test can be used) for the duration of the COVID-19 declaration under Section 564(b)(1) of the Act, 21 U.S.C.section 360bbb-3(b)(1), unless the authorization is terminated  or revoked sooner.       Influenza A by PCR NEGATIVE NEGATIVE Final   Influenza B by PCR NEGATIVE NEGATIVE Final    Comment: (NOTE) The Xpert Xpress SARS-CoV-2/FLU/RSV plus assay is intended as an aid in the diagnosis of influenza from Nasopharyngeal swab specimens and should not be used as a sole basis for treatment. Nasal washings and aspirates are unacceptable for Xpert Xpress SARS-CoV-2/FLU/RSV testing.  Fact Sheet for Patients: EntrepreneurPulse.com.au  Fact Sheet for Healthcare Providers: IncredibleEmployment.be  This test is not yet approved or cleared by the [place] FDA and has been authorized for detection and/or diagnosis of SARS-CoV-2 by FDA under an Emergency Use Authorization (EUA). This EUA will remain in effect (meaning this test can be used) for the duration of the COVID-19 declaration under Section 564(b)(1) of the Act, 21 U.S.C. section  360bbb-3(b)(1), unless the authorization is terminated or revoked.  Performed at [facility], [address] [place],  [address]      Radiology Studies: DG Chest 1 View  Result Date: 08/26/2021 CLINICAL DATA:  Increased vomiting and chills with weakness, initial encounter EXAM: PORTABLE CHEST 1 VIEW COMPARISON:  02/26/2019 FINDINGS: Cardiac shadow is stable. The lungs are well aerated bilaterally. Bilateral airspace opacities are noted in the bases consistent with multifocal pneumonia. No sizable effusion is seen. No bony abnormality is noted. IMPRESSION: Changes of multifocal pneumonia in the bases bilaterally. Electronically Signed   By: Inez Catalina M.D.   On: 08/26/2021 21:55  Scheduled Meds:  enoxaparin (LOVENOX) injection  0.5 mg/kg Subcutaneous Q24H   insulin aspart  0-9 Units Subcutaneous TID WC   ipratropium-albuterol  3 mL Nebulization Q4H   potassium chloride  40 mEq Oral BID   Continuous Infusions:  azithromycin Stopped (08/26/21 2334)   cefTRIAXone (ROCEPHIN)  IV Stopped (08/26/21 2217)   potassium chloride 10 mEq (08/27/21 1310)     LOS: 1 day   Time spent: 40 minutes. More than 50% of the time was spent in counseling/coordination of care  Lorella Nimrod, MD [facility]  If 7PM-7AM, please contact night-coverage [email or web address]  08/27/2021, 1:44 PM   This record has been created using Systems analyst. Errors have been sought and corrected,but may not always be located. Such creation errors do not reflect on the standard of care.

## 2021-08-28 LAB — BASIC METABOLIC PANEL
Anion gap: 7 (ref 5–15)
BUN: 7 mg/dL (ref 6–20)
CO2: 26 mmol/L (ref 22–32)
Calcium: 8.9 mg/dL (ref 8.9–10.3)
Chloride: 103 mmol/L (ref 98–111)
Creatinine, Ser: 0.71 mg/dL (ref 0.44–1.00)
GFR, Estimated: >60 mL/min (ref >60)
Glucose, Bld: 222 mg/dL — ABNORMAL HIGH (ref 70–99)
Potassium: 4.6 mmol/L (ref 3.5–5.1)
Sodium: 136 mmol/L (ref 135–145)

## 2021-08-28 LAB — GLUCOSE, CAPILLARY
Glucose-Capillary: 164 mg/dL — ABNORMAL HIGH (ref 70–99)
Glucose-Capillary: 163 mg/dL — ABNORMAL HIGH (ref 70–99)
Glucose-Capillary: 264 mg/dL — ABNORMAL HIGH (ref 70–99)
Glucose-Capillary: 187 mg/dL — ABNORMAL HIGH (ref 70–99)

## 2021-08-28 LAB — MAGNESIUM: Magnesium: 2.1 mg/dL (ref 1.7–2.4)

## 2021-08-28 MED ORDER — TRAZODONE HCL 50 MG PO TABS
25.0000 mg | ORAL_TABLET | Freq: Every evening | ORAL | Status: DC | PRN
  Administered 2021-08-29: 25 mg via ORAL
  Filled 2021-08-28 (×2): qty 1

## 2021-08-28 MED ORDER — ATORVASTATIN CALCIUM 20 MG PO TABS
40.0000 mg | ORAL_TABLET | Freq: Every day | ORAL | Status: DC
  Administered 2021-08-28 – 2021-08-30 (×3): 40 mg via ORAL
  Filled 2021-08-28 (×3): qty 2

## 2021-08-28 MED ORDER — MELATONIN 5 MG PO TABS
2.5000 mg | ORAL_TABLET | Freq: Every day | ORAL | Status: DC
  Administered 2021-08-28 – 2021-08-29 (×2): 2.5 mg via ORAL
  Filled 2021-08-28 (×2): qty 1

## 2021-08-28 MED ORDER — VENLAFAXINE HCL ER 150 MG PO CP24
300.0000 mg | ORAL_CAPSULE | Freq: Every day | ORAL | Status: DC
  Administered 2021-08-28 – 2021-08-30 (×3): 300 mg via ORAL
  Filled 2021-08-28 (×3): qty 2

## 2021-08-28 MED ORDER — KETOROLAC TROMETHAMINE 30 MG/ML IJ SOLN
30.0000 mg | Freq: Once | INTRAMUSCULAR | Status: AC
  Administered 2021-08-28: 30 mg via INTRAVENOUS
  Filled 2021-08-28: qty 1

## 2021-08-28 MED ORDER — FLUTICASONE FUROATE-VILANTEROL 200-25 MCG/INH IN AEPB
1.0000 | INHALATION_SPRAY | Freq: Every day | RESPIRATORY_TRACT | Status: DC
  Administered 2021-08-28 – 2021-08-30 (×3): 1 via RESPIRATORY_TRACT
  Filled 2021-08-28: qty 28

## 2021-08-28 NOTE — Progress Notes (Signed)
PROGRESS NOTE    Kelly Dillon  DZH:[id number] DOB: 06-Oct-1961 DOA: 08/26/2021 PCP: Pcp, No   Brief Narrative: Taken from H&P. Kelly Dillon is a 60 y.o. 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   Patient moved from [place] to [place] to live with her daughter in May.  Since then she has lost her Medicare and Medicaid and did not have her daily 3 L of oxygen.  Also has not been able to afford any of her previous medications including insulin.  For the past 2 weeks, she has been having nausea and vomiting and could not keep anything down. She was found to be febrile at 102 on presentation.  She met sepsis criteria with fever, tachycardia, tachypnea and leukocytosis.  Chest x-ray concerning for multifocal pneumonia.  COVID-19 and flu PCR came back negative.  Procalcitonin at 0.19.  Patient will need home oxygen on discharge.  Subjective: Patient continues to feel short of breath and remains little tachypneic, still having significant amount of cough.  She does not think that she is ready to go home yet.  Assessment & Plan:   Principal Problem:   Sepsis ([place]) Active Problems:   Moderate COPD (chronic obstructive pulmonary disease) (HCC)   Fibromyalgia   Community acquired pneumonia of right lower lobe of lung   Acute on chronic respiratory failure with hypoxia (HCC)   Hypokalemia   Asthma   Type 2 diabetes mellitus ([place])  Sepsis secondary to multifocal pneumonia.  Currently stable on her baseline oxygen requirement of 3 L.  Received IV hydration in ED. -Continue with Rocephin and Zithromax-to complete a 5-day course -Continue with supportive care -Continue with supplemental oxygen -Ambulate with pulse ox to qualify for home oxygen.  Hypokalemia.  Resolved -Replete potassium as needed  and monitor  Chronic hypoxic respiratory failure with COPD/asthma with mild exacerbation.  There was some wheezing today.  Saturating well on her baseline oxygen requirement of 3 L.  Patient was not using home oxygen for the past couple of months. -Make DuoNeb as scheduled -Albuterol nebulizer as needed -Continue with supplemental oxygen -Patient will need oxygen for discharge-obtain qualifying saturations.  Hypertension.  Currently not taking any meds and blood pressure within goal today. -Continue to monitor  Type 2 diabetes mellitus.  Apparently she was on insulin before but unable to afford it due to recent move.  Per patient she has now her Medicare and should be able to continue with her medications. -A1c-5.9 -Continue with SSI  History of fibromyalgia. -Patient needs to get established with PCP to help with chronic pain management. -TOC consult was placed  Objective: Vitals:   08/28/21 0219 08/28/21 0244 08/28/21 0426 08/28/21 0630  BP: 116/70  120/77 115/76  Pulse: 96  98 70  Resp: (!) 33  (!) 36 (!) 30  Temp: 99 F (37.2 C)  98.5 F (36.9 C) 98 F (36.7 C)  TempSrc: Oral  Oral Oral  SpO2: 99% 99% 98% 99%  Weight:      Height:        Intake/Output Summary (Last 24 hours) at 08/28/2021 1321 Last data filed at 08/28/2021 1029 Gross per 24 hour  Intake 1684.96 ml  Output 2200 ml  Net -515.04 ml    Filed Weights   08/26/21 2129  Weight: 90.7 kg    Examination:  General.  Obese lady, in  no acute distress. Pulmonary.  Lungs clear bilaterally, mildly tachypneic CV.  Regular rate and rhythm, no JVD, rub or murmur. Abdomen.  Soft, nontender, nondistended, BS positive. CNS.  Alert and oriented .  No focal neurologic deficit. Extremities.  No edema, no cyanosis, pulses intact and symmetrical. Psychiatry.  Judgment and insight appears normal.    DVT prophylaxis: Lovenox Code Status: Full Family Communication: Discussed with patient Disposition Plan:  Status  is: Inpatient  Remains inpatient appropriate because:Inpatient level of care appropriate due to severity of illness  Dispo: The patient is from: Home              Anticipated d/c is to: Home              Patient currently is not medically stable to d/c.   Difficult to place patient No              Level of care: Med-Surg  All the records are reviewed and case discussed with Care Management/Social Worker. Management plans discussed with the patient, nursing and they are in agreement.  Consultants:  None  Procedures:  Antimicrobials:  Ceftriaxone Zithromax  Data Reviewed: I have personally reviewed following labs and imaging studies  CBC: Recent Labs  Lab 08/26/21 2118 08/27/21 0633  WBC 15.9* 10.9*  NEUTROABS 13.2*  --   HGB 13.7 11.5*  HCT 38.3 32.9*  MCV 90.5 91.9  PLT 263 092    Basic Metabolic Panel: Recent Labs  Lab 08/26/21 2118 08/27/21 0633 08/28/21 0448  NA 132* 135 136  K 2.8* 2.8* 4.6  CL 94* 102 103  CO2 24 24 26   GLUCOSE 155* 139* 222*  BUN 7 5* 7  CREATININE 0.65 0.51 0.71  CALCIUM 9.2 8.0* 8.9  MG  --  1.8 2.1    GFR: Estimated Creatinine Clearance: 75.3 mL/min (by C-G formula based on SCr of 0.71 mg/dL). Liver Function Tests: Recent Labs  Lab 08/26/21 2118  AST 30  ALT 19  ALKPHOS 104  BILITOT 1.3*  PROT 9.0*  ALBUMIN 3.8    No results for input(s): LIPASE, AMYLASE in the last 168 hours. No results for input(s): AMMONIA in the last 168 hours. Coagulation Profile: Recent Labs  Lab 08/26/21 2118  INR 1.2    Cardiac Enzymes: No results for input(s): CKTOTAL, CKMB, CKMBINDEX, TROPONINI in the last 168 hours. BNP (last 3 results) No results for input(s): PROBNP in the last 8760 hours. HbA1C: Recent Labs    08/27/21 0633  HGBA1C 5.9*   CBG: Recent Labs  Lab 08/27/21 1229 08/27/21 1701 08/27/21 2212 08/28/21 0817 08/28/21 1208  GLUCAP 112* 114* 121* 164* 163*    Lipid Profile: No results for input(s): CHOL, HDL,  LDLCALC, TRIG, CHOLHDL, LDLDIRECT in the last 72 hours. Thyroid Function Tests: No results for input(s): TSH, T4TOTAL, FREET4, T3FREE, THYROIDAB in the last 72 hours. Anemia Panel: No results for input(s): VITAMINB12, FOLATE, FERRITIN, TIBC, IRON, RETICCTPCT in the last 72 hours. Sepsis Labs: Recent Labs  Lab 08/26/21 2118  PROCALCITON 0.19  LATICACIDVEN 1.3     Recent Results (from the past 240 hour(s))  Culture, blood (Routine x 2)     Status: None (Preliminary result)   Collection Time: 08/26/21  9:18 PM   Specimen: BLOOD  Result Value Ref Range Status   Specimen Description BLOOD RIGHT WRIST  Final   Special Requests   Final    BOTTLES DRAWN AEROBIC AND ANAEROBIC Blood Culture adequate volume   Culture  Final    NO GROWTH 2 DAYS Performed at [facility], [address] [place], [place] [address]    Report Status PENDING  Incomplete  Culture, blood (Routine x 2)     Status: None (Preliminary result)   Collection Time: 08/26/21  9:23 PM   Specimen: BLOOD  Result Value Ref Range Status   Specimen Description BLOOD RIGHT ANTECUBITAL  Final   Special Requests   Final    BOTTLES DRAWN AEROBIC AND ANAEROBIC Blood Culture adequate volume   Culture   Final    NO GROWTH 2 DAYS Performed at [facility], [address], [place], [place] [address]    Report Status PENDING  Incomplete  Resp Panel by RT-PCR (Flu A&B, Covid) Nasopharyngeal Swab     Status: None   Collection Time: 08/26/21  9:40 PM   Specimen: Nasopharyngeal Swab; Nasopharyngeal(NP) swabs in vial transport medium  Result Value Ref Range Status   SARS Coronavirus 2 by RT PCR NEGATIVE NEGATIVE Final    Comment: (NOTE) SARS-CoV-2 target nucleic acids are NOT DETECTED.  The SARS-CoV-2 RNA is generally detectable in upper respiratory specimens during the acute phase of infection. The lowest concentration of SARS-CoV-2 viral copies this assay can detect is 138 copies/mL. A negative result does  not preclude SARS-Cov-2 infection and should not be used as the sole basis for treatment or other patient management decisions. A negative result may occur with  improper specimen collection/handling, submission of specimen other than nasopharyngeal swab, presence of viral mutation(s) within the areas targeted by this assay, and inadequate number of viral copies(<138 copies/mL). A negative result must be combined with clinical observations, patient history, and epidemiological information. The expected result is Negative.  Fact Sheet for Patients:  EntrepreneurPulse.com.au  Fact Sheet for Healthcare Providers:  IncredibleEmployment.be  This test is no t yet approved or cleared by the [place] FDA and  has been authorized for detection and/or diagnosis of SARS-CoV-2 by FDA under an Emergency Use Authorization (EUA). This EUA will remain  in effect (meaning this test can be used) for the duration of the COVID-19 declaration under Section 564(b)(1) of the Act, 21 U.S.C.section 360bbb-3(b)(1), unless the authorization is terminated  or revoked sooner.       Influenza A by PCR NEGATIVE NEGATIVE Final   Influenza B by PCR NEGATIVE NEGATIVE Final    Comment: (NOTE) The Xpert Xpress SARS-CoV-2/FLU/RSV plus assay is intended as an aid in the diagnosis of influenza from Nasopharyngeal swab specimens and should not be used as a sole basis for treatment. Nasal washings and aspirates are unacceptable for Xpert Xpress SARS-CoV-2/FLU/RSV testing.  Fact Sheet for Patients: EntrepreneurPulse.com.au  Fact Sheet for Healthcare Providers: IncredibleEmployment.be  This test is not yet approved or cleared by the [place] FDA and has been authorized for detection and/or diagnosis of SARS-CoV-2 by FDA under an Emergency Use Authorization (EUA). This EUA will remain in effect (meaning this test can be used) for the  duration of the COVID-19 declaration under Section 564(b)(1) of the Act, 21 U.S.C. section 360bbb-3(b)(1), unless the authorization is terminated or revoked.  Performed at [facility], [address] [place],  [address]       Radiology Studies: DG Chest 1 View  Result Date: 08/26/2021 CLINICAL DATA:  Increased vomiting and chills with weakness, initial encounter EXAM: PORTABLE CHEST 1 VIEW COMPARISON:  02/26/2019 FINDINGS: Cardiac shadow is stable. The lungs are well aerated bilaterally. Bilateral airspace opacities are noted in the bases  consistent with multifocal pneumonia. No sizable effusion is seen. No bony abnormality is noted. IMPRESSION: Changes of multifocal pneumonia in the bases bilaterally. Electronically Signed   By: Inez Catalina M.D.   On: 08/26/2021 21:55    Scheduled Meds:  enoxaparin (LOVENOX) injection  0.5 mg/kg Subcutaneous Q24H   insulin aspart  0-9 Units Subcutaneous TID WC   ipratropium-albuterol  3 mL Nebulization Q4H   ketorolac  30 mg Intravenous Once   methylPREDNISolone (SOLU-MEDROL) injection  40 mg Intravenous BID   Continuous Infusions:  azithromycin 500 mg (08/27/21 2311)   cefTRIAXone (ROCEPHIN)  IV 2 g (08/27/21 2200)     LOS: 2 days   Time spent: 38 minutes. More than 50% of the time was spent in counseling/coordination of care  Lorella Nimrod, MD [facility]  If 7PM-7AM, please contact night-coverage [email or web address]  08/28/2021, 1:21 PM   This record has been created using Systems analyst. Errors have been sought and corrected,but may not always be located. Such creation errors do not reflect on the standard of care.

## 2021-08-28 NOTE — Progress Notes (Signed)
   08/28/21 0219  Assess: MEWS Score  Temp 99 F (37.2 C)  BP 116/70  Pulse Rate 96  Resp (!) 33  Level of Consciousness Alert  SpO2 99 %  O2 Device Nasal Cannula  Assess: MEWS Score  MEWS Temp 0  MEWS Systolic 0  MEWS Pulse 0  MEWS RR 2  MEWS LOC 0  MEWS Score 2  MEWS Score Color Yellow  Assess: if the MEWS score is Yellow or Red  Were vital signs taken at a resting state? Yes  Focused Assessment No change from prior assessment  Does the patient meet 2 or more of the SIRS criteria? No  Treat  MEWS Interventions Escalated (See documentation below)  Pain Scale 0-10  Pain Score 0  Take Vital Signs  Increase Vital Sign Frequency  Yellow: Q 2hr X 2 then Q 4hr X 2, if remains yellow, continue Q 4hrs  Escalate  MEWS: Escalate Yellow: discuss with charge nurse/RN and consider discussing with provider and RRT  Notify: Charge Nurse/RN  Name of Charge Nurse/RN Notified [place], RN  Date Charge Nurse/RN Notified 08/28/21  Time Charge Nurse/RN Notified 0120  Notify: Provider  Provider Name/Title Sharion Settler, FNP  Date Provider Notified 08/28/21  Time Provider Notified [phone]  Notification Type Page  Notification Reason Other (Comment)  Provider response No new orders  Date of Provider Response 08/28/21  Time of Provider Response 0255  Document  Patient Outcome  (stable)  Assess: SIRS CRITERIA  SIRS Temperature  0  SIRS Pulse 1  SIRS Respirations  1  SIRS WBC 0  SIRS Score Sum  2

## 2021-08-28 NOTE — Plan of Care (Signed)
Safety maintained. Remains tachypneic. Robitussin given x 2 and tessalon given 1 for cough. PRN zofran x 2 for nausea. Started on steroids overnight. Sating well on 4L O2 via [place]. Yellow mews score due to RR of 33. Sharion Settler, FNP notified. Problem: Education: Goal: Knowledge of General Education information will improve Description: Including pain rating scale, medication(s)/side effects and non-pharmacologic comfort measures Outcome: Progressing   Problem: Health Behavior/Discharge Planning: Goal: Ability to manage health-related needs will improve Outcome: Progressing   Problem: Clinical Measurements: Goal: Ability to maintain clinical measurements within normal limits will improve Outcome: Progressing Goal: Will remain free from infection Outcome: Progressing Goal: Diagnostic test results will improve Outcome: Progressing Goal: Respiratory complications will improve Outcome: Progressing Goal: Cardiovascular complication will be avoided Outcome: Progressing   Problem: Activity: Goal: Risk for activity intolerance will decrease Outcome: Progressing   Problem: Nutrition: Goal: Adequate nutrition will be maintained Outcome: Progressing   Problem: Coping: Goal: Level of anxiety will decrease Outcome: Progressing   Problem: Elimination: Goal: Will not experience complications related to bowel motility Outcome: Progressing Goal: Will not experience complications related to urinary retention Outcome: Progressing   Problem: Pain Managment: Goal: General experience of comfort will improve Outcome: Progressing   Problem: Safety: Goal: Ability to remain free from injury will improve Outcome: Progressing   Problem: Skin Integrity: Goal: Risk for impaired skin integrity will decrease Outcome: Progressing   Problem: Activity: Goal: Ability to tolerate increased activity will improve Outcome: Progressing   Problem: Clinical Measurements: Goal: Ability to maintain a  body temperature in the normal range will improve Outcome: Progressing   Problem: Respiratory: Goal: Ability to maintain adequate ventilation will improve Outcome: Progressing Goal: Ability to maintain a clear airway will improve Outcome: Progressing

## 2021-08-28 NOTE — Evaluation (Signed)
Physical Therapy Evaluation Patient Details Name: Kelly Dillon MRN: [id number] DOB: Apr 01, 1961 Today's Date: 08/28/2021  History of Present Illness  Kelly Dillon is a 60 y.o. 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   Clinical Impression  Patient received in bed, she reports she has not been feeling good and coughing a lot, but agreeable to try. Performed bed mobility with mod independence, began coughing with mobility. HR increased to 120s seated on side of bed coughing. Patient was able to stand with min assist and take a few steps along edge of bed with min assist. She is limited by weakness and sob and will continue to benefit from skilled PT while here to improve activity tolerance.        Recommendations for follow up therapy are one component of a multi-disciplinary discharge planning process, led by the attending physician.  Recommendations may be updated based on patient status, additional functional criteria and insurance authorization.  Follow Up Recommendations Supervision - Intermittent;Home health PT    Equipment Recommendations  None recommended by PT    Recommendations for Other Services       Precautions / Restrictions Precautions Precautions: Fall Restrictions Weight Bearing Restrictions: No      Mobility  Bed Mobility Overal bed mobility: Modified Independent             General bed mobility comments: increased effort    Transfers Overall transfer level: Needs assistance Equipment used: 1 person hand held assist Transfers: Sit to/from Stand Sit to Stand: Min assist            Ambulation/Gait Ambulation/Gait assistance: Min assist Gait Distance (Feet): 3 Feet Assistive device: 1 person hand held assist   Gait velocity: decr   General Gait Details:  patient coughing and sob with any mobility. HR up to 120s with activity. She was able to side step along edge of bed with min assist.  Stairs            Wheelchair Mobility    Modified Rankin (Stroke Patients Only)       Balance Overall balance assessment: Needs assistance Sitting-balance support: Feet supported Sitting balance-Leahy Scale: Fair     Standing balance support: Bilateral upper extremity supported;During functional activity Standing balance-Leahy Scale: Fair                               Pertinent Vitals/Pain Pain Assessment: No/denies pain    Home Living Family/patient expects to be discharged to:: Private residence Living Arrangements: Children Available Help at Discharge: Family Type of Home: Mobile home Home Access: Stairs to enter   Technical brewer of Steps: 2-3 Home Layout: One level Home Equipment: Environmental consultant - 2 wheels      Prior Function Level of Independence: Independent with assistive device(s)         Comments: patient uses walker and O2 at home     Hand Dominance        Extremity/Trunk Assessment   Upper Extremity Assessment Upper Extremity Assessment: Overall WFL for tasks assessed    Lower Extremity Assessment Lower Extremity Assessment: Overall WFL for tasks assessed    Cervical / Trunk Assessment Cervical / Trunk Assessment: Normal  Communication   Communication: No difficulties  Cognition Arousal/Alertness: Awake/alert Behavior During Therapy: WFL for tasks assessed/performed Overall Cognitive Status:  Within Functional Limits for tasks assessed                                        General Comments      Exercises     Assessment/Plan    PT Assessment Patient needs continued PT services  PT Problem List Decreased strength;Decreased mobility;Decreased activity tolerance;Decreased balance;Cardiopulmonary status limiting activity       PT Treatment Interventions Gait  training;Therapeutic exercise;Functional mobility training;Stair training;Patient/family education;Therapeutic activities    PT Goals (Current goals can be found in the Care Plan section)  Acute Rehab PT Goals Patient Stated Goal: to return home with daughter/family PT Goal Formulation: With patient/family Time For Goal Achievement: 09/04/21 Potential to Achieve Goals: Good    Frequency Min 2X/week   Barriers to discharge   patient's daughter or grandchildren will be there to assist    Co-evaluation               AM-PAC PT "6 Clicks" Mobility  Outcome Measure Help needed turning from your back to your side while in a flat bed without using bedrails?: A Little Help needed moving from lying on your back to sitting on the side of a flat bed without using bedrails?: A Little Help needed moving to and from a bed to a chair (including a wheelchair)?: A Little Help needed standing up from a chair using your arms (e.g., wheelchair or bedside chair)?: A Little Help needed to walk in hospital room?: A Lot Help needed climbing 3-5 steps with a railing? : A Lot 6 Click Score: 16    End of Session Equipment Utilized During Treatment: Oxygen Activity Tolerance: Patient limited by fatigue;Other (comment) (cough, sob) Patient left: in bed;with bed alarm set;with call bell/phone within reach;with family/visitor present Nurse Communication: Mobility status PT Visit Diagnosis: Other abnormalities of gait and mobility (R26.89);Difficulty in walking, not elsewhere classified (R26.2)    Time: 1610-9604 PT Time Calculation (min) (ACUTE ONLY): 12 min   Charges:   PT Evaluation $PT Eval Moderate Complexity: 1 Mod          Elysha Daw, PT, GCS 08/28/21,3:00 PM

## 2021-08-29 LAB — GLUCOSE, CAPILLARY
Glucose-Capillary: 156 mg/dL — ABNORMAL HIGH (ref 70–99)
Glucose-Capillary: 156 mg/dL — ABNORMAL HIGH (ref 70–99)
Glucose-Capillary: 230 mg/dL — ABNORMAL HIGH (ref 70–99)
Glucose-Capillary: 141 mg/dL — ABNORMAL HIGH (ref 70–99)

## 2021-08-29 MED ORDER — PREDNISONE 50 MG PO TABS
50.0000 mg | ORAL_TABLET | Freq: Every day | ORAL | Status: DC
  Administered 2021-08-29 – 2021-08-30 (×2): 50 mg via ORAL
  Filled 2021-08-29 (×2): qty 1

## 2021-08-29 MED ORDER — LOSARTAN POTASSIUM 25 MG PO TABS
25.0000 mg | ORAL_TABLET | Freq: Every day | ORAL | Status: DC
  Administered 2021-08-29 – 2021-08-30 (×2): 25 mg via ORAL
  Filled 2021-08-29 (×2): qty 1

## 2021-08-29 MED ORDER — HYDROCOD POLST-CPM POLST ER 10-8 MG/5ML PO SUER
5.0000 mL | Freq: Two times a day (BID) | ORAL | Status: DC | PRN
  Administered 2021-08-30: 5 mL via ORAL
  Filled 2021-08-29: qty 5

## 2021-08-29 MED ORDER — GUAIFENESIN ER 600 MG PO TB12
600.0000 mg | ORAL_TABLET | Freq: Two times a day (BID) | ORAL | Status: DC
  Administered 2021-08-29 – 2021-08-30 (×3): 600 mg via ORAL
  Filled 2021-08-29 (×3): qty 1

## 2021-08-29 NOTE — Progress Notes (Signed)
Physical Therapy Treatment Patient Details Name: Kelly Dillon MRN: [id number] DOB: Jul 02, 1961 Today's Date: 08/29/2021   History of Present Illness Kelly Dillon is a 60 y.o. 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    PT Comments    Pt stated she was up with nursing this am and trying to walk and it went poorly.  Stated LE's were weak and "wobbly" and she was unable to walk any distance.    Reviewed HEP.  Pt with increased respiratory rate at rest but sats and HR within limits.  Return demo of some exercises but given RR, mostly limited to verbal education/review.  Pt has a RW at home per eval.  Given mobility limitations, if pt does not improve with gait she would benefit from a wheelchair at home for safe mobility.  Family is available to help per pt.   Patient suffers from COPD/asthma which impairs his/her ability to perform daily activities like toileting, feeding, dressing, grooming, bathing in the home. A cane, walker, crutch will not resolve the patient's issue with performing activities of daily living. A lightweight wheelchair and cushion is required/recommended and will allow patient to safely perform daily activities.   Patient can safely propel the wheelchair in the home or has a caregiver who can provide assistance.    Recommendations for follow up therapy are one component of a multi-disciplinary discharge planning process, led by the attending physician.  Recommendations may be updated based on patient status, additional functional criteria and insurance authorization.  Follow Up Recommendations  Home health PT     Equipment Recommendations  None recommended by PT    Recommendations for Other Services       Precautions / Restrictions Precautions Precautions:  Fall Restrictions Weight Bearing Restrictions: No     Mobility  Bed Mobility                    Transfers                    Ambulation/Gait                 Stairs             Wheelchair Mobility    Modified Rankin (Stroke Patients Only)       Balance                                            Cognition Arousal/Alertness: Awake/alert Behavior During Therapy: WFL for tasks assessed/performed Overall Cognitive Status: Within Functional Limits for tasks assessed                                        Exercises      General Comments        Pertinent Vitals/Pain Pain Assessment: No/denies pain    Home Living                      Prior Function            PT Goals (current goals can now be found in the care plan section) Progress towards PT goals: Not progressing  toward goals - comment    Frequency    Min 2X/week      PT Plan      Co-evaluation              AM-PAC PT "6 Clicks" Mobility   Outcome Measure  Help needed turning from your back to your side while in a flat bed without using bedrails?: A Little Help needed moving from lying on your back to sitting on the side of a flat bed without using bedrails?: A Little Help needed moving to and from a bed to a chair (including a wheelchair)?: A Little Help needed standing up from a chair using your arms (e.g., wheelchair or bedside chair)?: A Little Help needed to walk in hospital room?: A Lot Help needed climbing 3-5 steps with a railing? : A Lot 6 Click Score: 16    End of Session Equipment Utilized During Treatment: Oxygen Activity Tolerance: Patient limited by fatigue;Other (comment) (cough, sob) Patient left: in bed;with bed alarm set;with call bell/phone within reach;with family/visitor present Nurse Communication: Mobility status PT Visit Diagnosis: Other abnormalities of gait and mobility (R26.89);Difficulty  in walking, not elsewhere classified (R26.2)     Time: 2951-8841 PT Time Calculation (min) (ACUTE ONLY): 9 min  Charges:  $Therapeutic Exercise: 8-22 mins                    Chesley Noon, PTA 08/29/21, 1:00 PM

## 2021-08-29 NOTE — Plan of Care (Signed)
No acute events during the night. VSS. Oxygen saturations maintained WNL on 2L O2 via [place]. Per patient she feels much better. Breathing has improved.  Problem: Education: Goal: Knowledge of General Education information will improve Description: Including pain rating scale, medication(s)/side effects and non-pharmacologic comfort measures Outcome: Progressing   Problem: Health Behavior/Discharge Planning: Goal: Ability to manage health-related needs will improve Outcome: Progressing   Problem: Clinical Measurements: Goal: Ability to maintain clinical measurements within normal limits will improve Outcome: Progressing Goal: Will remain free from infection Outcome: Progressing Goal: Diagnostic test results will improve Outcome: Progressing Goal: Respiratory complications will improve Outcome: Progressing Goal: Cardiovascular complication will be avoided Outcome: Progressing   Problem: Activity: Goal: Risk for activity intolerance will decrease Outcome: Progressing   Problem: Nutrition: Goal: Adequate nutrition will be maintained Outcome: Progressing   Problem: Coping: Goal: Level of anxiety will decrease Outcome: Progressing   Problem: Elimination: Goal: Will not experience complications related to bowel motility Outcome: Progressing Goal: Will not experience complications related to urinary retention Outcome: Progressing   Problem: Pain Managment: Goal: General experience of comfort will improve Outcome: Progressing   Problem: Safety: Goal: Ability to remain free from injury will improve Outcome: Progressing   Problem: Skin Integrity: Goal: Risk for impaired skin integrity will decrease Outcome: Progressing   Problem: Activity: Goal: Ability to tolerate increased activity will improve Outcome: Progressing   Problem: Clinical Measurements: Goal: Ability to maintain a body temperature in the normal range will improve Outcome: Progressing   Problem:  Respiratory: Goal: Ability to maintain adequate ventilation will improve Outcome: Progressing Goal: Ability to maintain a clear airway will improve Outcome: Progressing

## 2021-08-29 NOTE — TOC CM/SW Note (Signed)
CSW acknowledges consults for medications assistance. Sent secure email to financial counselor to confirm if patient does or does not have Medicare or Medicaid.  Dayton Scrape, [place]

## 2021-08-29 NOTE — Progress Notes (Signed)
Patient on 3L of O2 chronically at home. Patient 96% sitting up at bedside on 3L Nasal [place] ([place]) . Patient walked to door and back to chair approximately 10 feet. Patient unsteady while walking. O2 dropped to 88% on 3L of [place]. Patient sitting up in chair resting upon walking O2 96% on 3L of [place].

## 2021-08-29 NOTE — Progress Notes (Signed)
PROGRESS NOTE    Kelly Dillon  OIN:[id number] DOB: [birth year] DOA: 08/26/2021 PCP: Pcp, No   Brief Narrative: Taken from H&P. Kelly Dillon is a 60 y.o. 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   Patient moved from [place] to [place] to live with her daughter in May.  Since then she has lost her Medicare and Medicaid and did not have her daily 3 L of oxygen.  Also has not been able to afford any of her previous medications including insulin.  For the past 2 weeks, she has been having nausea and vomiting and could not keep anything down. She was found to be febrile at 102 on presentation.  She met sepsis criteria with fever, tachycardia, tachypnea and leukocytosis.  Chest x-ray concerning for multifocal pneumonia.  COVID-19 and flu PCR came back negative.  Procalcitonin at 0.19.  Patient will need home oxygen on discharge, qualifying sats are in by nursing staff and home oxygen with home health services ordered.  Subjective: Patient continues to feel some shortness of breath and coughing.  Unable to sleep at night because of the cough.  She was quite unsteady and feeling weak on feet.  Assessment & Plan:   Principal Problem:   Sepsis ([place]) Active Problems:   Moderate COPD (chronic obstructive pulmonary disease) (HCC)   Fibromyalgia   Community acquired pneumonia of right lower lobe of lung   Acute on chronic respiratory failure with hypoxia (HCC)   Hypokalemia   Asthma   Type 2 diabetes mellitus ([place])  Sepsis secondary to multifocal pneumonia.  Currently stable on her baseline oxygen requirement of 3 L at rest but do desaturated with ambulation.  Received IV hydration in ED. -Continue with Rocephin and Zithromax-to complete a 5-day course -Continue with supportive care -Continue with  supplemental oxygen -Tussionex was added to help with sleep at night.  Hypokalemia.  Resolved -Replete potassium as needed and monitor  Chronic hypoxic respiratory failure with COPD/asthma with mild exacerbation.  There was some wheezing today.  Saturating well on her baseline oxygen requirement of 3 L at rest but will need up to 4 L with ambulation.  Patient was not using home oxygen for the past couple of months. -Make DuoNeb as scheduled -Albuterol nebulizer as needed -Continue with supplemental oxygen -Patient will need oxygen for discharge-obtain qualifying saturations.  Hypertension.  Blood pressure trending up, she was not on any antihypertensives at home. -Start her on losartan 25 mg daily-Will uptitrate as needed -Continue to monitor  Type 2 diabetes mellitus.  Apparently she was on insulin before but unable to afford it due to recent move.  Per patient she has now her Medicare and should be able to continue with her medications. -A1c-5.9 -Continue with SSI  History of fibromyalgia. -Patient needs to get established with PCP to help with chronic pain management. -TOC consult was placed  Objective: Vitals:   08/29/21 0421 08/29/21 0444 08/29/21 0806 08/29/21 1148  BP:  (!) 162/93 (!) 163/88 (!) 174/90  Pulse:  99 (!) 109 (!) 109  Resp:  _0 Temp:  98 F (36.7 C) 98.1 F (36.7 C) 97.9 F (36.6 C)  TempSrc:      SpO2: 98% 96% 97% 92%  Weight:      Height:        Intake/Output Summary (Last 24 hours) at 08/29/2021  [place] filed at 08/29/2021 1145 Gross per 24 hour  Intake 1060 ml  Output 1900 ml  Net -840 ml    Filed Weights   08/26/21 2129  Weight: 90.7 kg    Examination:  General.  Obese lady, in no acute distress. Pulmonary.  Lungs clear bilaterally, normal respiratory effort. CV.  Regular rate and rhythm, no JVD, rub or murmur. Abdomen.  Soft, nontender, nondistended, BS positive. CNS.  Alert and oriented .  No focal neurologic  deficit. Extremities.  No edema, no cyanosis, pulses intact and symmetrical. Psychiatry.  Judgment and insight appears normal.    DVT prophylaxis: Lovenox Code Status: Full Family Communication: Discussed with patient Disposition Plan:  Status is: Inpatient  Remains inpatient appropriate because:Inpatient level of care appropriate due to severity of illness  Dispo: The patient is from: Home              Anticipated d/c is to: Home              Patient currently is not medically stable to d/c.   Difficult to place patient No              Level of care: Med-Surg  All the records are reviewed and case discussed with Care Management/Social Worker. Management plans discussed with the patient, nursing and they are in agreement.  Consultants:  None  Procedures:  Antimicrobials:  Ceftriaxone Zithromax  Data Reviewed: I have personally reviewed following labs and imaging studies  CBC: Recent Labs  Lab 08/26/21 2118 08/27/21 0633  WBC 15.9* 10.9*  NEUTROABS 13.2*  --   HGB 13.7 11.5*  HCT 38.3 32.9*  MCV 90.5 91.9  PLT 263 381    Basic Metabolic Panel: Recent Labs  Lab 08/26/21 2118 08/27/21 0633 08/28/21 0448  NA 132* 135 136  K 2.8* 2.8* 4.6  CL 94* 102 103  CO2 _0 GLUCOSE 155* 139* 222*  BUN 7 5* 7  CREATININE 0.65 0.51 0.71  CALCIUM 9.2 8.0* 8.9  MG  --  1.8 2.1    GFR: Estimated Creatinine Clearance: 75.3 mL/min (by C-G formula based on SCr of 0.71 mg/dL). Liver Function Tests: Recent Labs  Lab 08/26/21 2118  AST 30  ALT 19  ALKPHOS 104  BILITOT 1.3*  PROT 9.0*  ALBUMIN 3.8    No results for input(s): LIPASE, AMYLASE in the last 168 hours. No results for input(s): AMMONIA in the last 168 hours. Coagulation Profile: Recent Labs  Lab 08/26/21 2118  INR 1.2    Cardiac Enzymes: No results for input(s): CKTOTAL, CKMB, CKMBINDEX, TROPONINI in the last 168 hours. BNP (last 3 results) No results for input(s): PROBNP in the last 8760  hours. HbA1C: Recent Labs    08/27/21 0633  HGBA1C 5.9*    CBG: Recent Labs  Lab 08/28/21 1208 08/28/21 1539 08/28/21 2046 08/29/21 0748 08/29/21 1147  GLUCAP 163* 264* 187* 156* 156*    Lipid Profile: No results for input(s): CHOL, HDL, LDLCALC, TRIG, CHOLHDL, LDLDIRECT in the last 72 hours. Thyroid Function Tests: No results for input(s): TSH, T4TOTAL, FREET4, T3FREE, THYROIDAB in the last 72 hours. Anemia Panel: No results for input(s): VITAMINB12, FOLATE, FERRITIN, TIBC, IRON, RETICCTPCT in the last 72 hours. Sepsis Labs: Recent Labs  Lab 08/26/21 2118  PROCALCITON 0.19  LATICACIDVEN 1.3     Recent Results (from the past 240 hour(s))  Culture, blood (Routine x 2)     Status: None (Preliminary result)   Collection  Time: 08/26/21  9:18 PM   Specimen: BLOOD  Result Value Ref Range Status   Specimen Description BLOOD RIGHT WRIST  Final   Special Requests   Final    BOTTLES DRAWN AEROBIC AND ANAEROBIC Blood Culture adequate volume   Culture   Final    NO GROWTH 3 DAYS Performed at [facility], [address] [place], [place] [address]    Report Status PENDING  Incomplete  Culture, blood (Routine x 2)     Status: None (Preliminary result)   Collection Time: 08/26/21  9:23 PM   Specimen: BLOOD  Result Value Ref Range Status   Specimen Description BLOOD RIGHT ANTECUBITAL  Final   Special Requests   Final    BOTTLES DRAWN AEROBIC AND ANAEROBIC Blood Culture adequate volume   Culture   Final    NO GROWTH 3 DAYS Performed at [facility], [address] [place], [place] [address]    Report Status PENDING  Incomplete  Resp Panel by RT-PCR (Flu A&B, Covid) Nasopharyngeal Swab     Status: None   Collection Time: 08/26/21  9:40 PM   Specimen: Nasopharyngeal Swab; Nasopharyngeal(NP) swabs in vial transport medium  Result Value Ref Range Status   SARS Coronavirus 2 by RT PCR NEGATIVE NEGATIVE Final    Comment: (NOTE) SARS-CoV-2 target nucleic  acids are NOT DETECTED.  The SARS-CoV-2 RNA is generally detectable in upper respiratory specimens during the acute phase of infection. The lowest concentration of SARS-CoV-2 viral copies this assay can detect is 138 copies/mL. A negative result does not preclude SARS-Cov-2 infection and should not be used as the sole basis for treatment or other patient management decisions. A negative result may occur with  improper specimen collection/handling, submission of specimen other than nasopharyngeal swab, presence of viral mutation(s) within the areas targeted by this assay, and inadequate number of viral copies(<138 copies/mL). A negative result must be combined with clinical observations, patient history, and epidemiological information. The expected result is Negative.  Fact Sheet for Patients:  EntrepreneurPulse.com.au  Fact Sheet for Healthcare Providers:  IncredibleEmployment.be  This test is no t yet approved or cleared by the [place] FDA and  has been authorized for detection and/or diagnosis of SARS-CoV-2 by FDA under an Emergency Use Authorization (EUA). This EUA will remain  in effect (meaning this test can be used) for the duration of the COVID-19 declaration under Section 564(b)(1) of the Act, 21 U.S.C.section 360bbb-3(b)(1), unless the authorization is terminated  or revoked sooner.       Influenza A by PCR NEGATIVE NEGATIVE Final   Influenza B by PCR NEGATIVE NEGATIVE Final    Comment: (NOTE) The Xpert Xpress SARS-CoV-2/FLU/RSV plus assay is intended as an aid in the diagnosis of influenza from Nasopharyngeal swab specimens and should not be used as a sole basis for treatment. Nasal washings and aspirates are unacceptable for Xpert Xpress SARS-CoV-2/FLU/RSV testing.  Fact Sheet for Patients: EntrepreneurPulse.com.au  Fact Sheet for Healthcare Providers: IncredibleEmployment.be  This  test is not yet approved or cleared by the [place] FDA and has been authorized for detection and/or diagnosis of SARS-CoV-2 by FDA under an Emergency Use Authorization (EUA). This EUA will remain in effect (meaning this test can be used) for the duration of the COVID-19 declaration under Section 564(b)(1) of the Act, 21 U.S.C. section 360bbb-3(b)(1), unless the authorization is terminated or revoked.  Performed at [facility], [address] [place],  [address]  Radiology Studies: No results found.  Scheduled Meds:  atorvastatin  40 mg Oral Daily   enoxaparin (LOVENOX) injection  0.5 mg/kg Subcutaneous Q24H   fluticasone furoate-vilanterol  1 puff Inhalation Daily   guaiFENesin  600 mg Oral BID   insulin aspart  0-9 Units Subcutaneous TID WC   ipratropium-albuterol  3 mL Nebulization Q4H   losartan  25 mg Oral Daily   melatonin  2.5 mg Oral QHS   predniSONE  50 mg Oral Q breakfast   venlafaxine XR  300 mg Oral Daily   Continuous Infusions:  azithromycin Stopped (08/28/21 2300)   cefTRIAXone (ROCEPHIN)  IV Stopped (08/28/21 2200)     LOS: 3 days   Time spent: 36 minutes. More than 50% of the time was spent in counseling/coordination of care  Lorella Nimrod, MD [facility]  If 7PM-7AM, please contact night-coverage [email or web address]  08/29/2021, 12:32 PM   This record has been created using Systems analyst. Errors have been sought and corrected,but may not always be located. Such creation errors do not reflect on the standard of care.

## 2021-08-30 DIAGNOSIS — M797 Fibromyalgia: Secondary | ICD-10-CM

## 2021-08-30 LAB — GLUCOSE, CAPILLARY
Glucose-Capillary: 120 mg/dL — ABNORMAL HIGH (ref 70–99)
Glucose-Capillary: 135 mg/dL — ABNORMAL HIGH (ref 70–99)

## 2021-08-30 MED ORDER — METFORMIN HCL 1000 MG PO TABS
1000.0000 mg | ORAL_TABLET | Freq: Two times a day (BID) | ORAL | 1 refills | Status: DC
Start: 2021-08-30 — End: 2022-02-08

## 2021-08-30 MED ORDER — ATORVASTATIN CALCIUM 40 MG PO TABS: 40.0000 mg | ORAL_TABLET | Freq: Every day | ORAL | 11 refills | Status: DC

## 2021-08-30 MED ORDER — OMEPRAZOLE 20 MG PO CPDR: 20.0000 mg | DELAYED_RELEASE_CAPSULE | Freq: Every day | ORAL | 1 refills | Status: DC

## 2021-08-30 MED ORDER — DM-GUAIFENESIN ER 30-600 MG PO TB12: 1.0000 | ORAL_TABLET | Freq: Two times a day (BID) | ORAL | 1 refills | Status: DC

## 2021-08-30 MED ORDER — INSULIN GLARGINE 100 UNITS/ML SOLOSTAR PEN: 15.0000 [IU] | PEN_INJECTOR | Freq: Every day | SUBCUTANEOUS | 0 refills | Status: DC

## 2021-08-30 MED ORDER — PREDNISONE 50 MG PO TABS: ORAL_TABLET | ORAL | 0 refills | Status: DC

## 2021-08-30 MED ORDER — HYDROCOD POLST-CPM POLST ER 10-8 MG/5ML PO SUER: 5.0000 mL | Freq: Two times a day (BID) | ORAL | 0 refills | Status: DC | PRN

## 2021-08-30 MED ORDER — LOSARTAN POTASSIUM 25 MG PO TABS: 25.0000 mg | ORAL_TABLET | Freq: Every day | ORAL | 1 refills | Status: DC

## 2021-08-30 MED ORDER — VENLAFAXINE HCL ER 150 MG PO CP24: 300.0000 mg | ORAL_CAPSULE | Freq: Every day | ORAL | 1 refills | Status: DC

## 2021-08-30 MED ORDER — ALBUTEROL SULFATE (2.5 MG/3ML) 0.083% IN NEBU: 3.0000 mL | INHALATION_SOLUTION | Freq: Four times a day (QID) | RESPIRATORY_TRACT | 12 refills | Status: DC | PRN

## 2021-08-30 MED ORDER — LEVOFLOXACIN 750 MG PO TABS: 750.0000 mg | ORAL_TABLET | Freq: Every day | ORAL | 0 refills | Status: AC

## 2021-08-30 MED ORDER — FLUTICASONE-SALMETEROL 250-50 MCG/ACT IN AEPB: 1.0000 | INHALATION_SPRAY | Freq: Two times a day (BID) | RESPIRATORY_TRACT | 0 refills | Status: DC

## 2021-08-30 MED ORDER — COMBIVENT RESPIMAT 20-100 MCG/ACT IN AERS: 1.0000 | INHALATION_SPRAY | Freq: Two times a day (BID) | RESPIRATORY_TRACT | 1 refills | Status: DC

## 2021-08-30 NOTE — Progress Notes (Signed)
Met with the patient to discuss DC plan and needs She lives at home with her daughter She needs Home oxygen and a rolling walker and 3 in 1, adapt will deliver to the room prior to DC Confirmed that the patient is active with Medicare and Medicaid The patient was happy to hear that She stated that she has transportation with her daughter She is agreeable to do [place] has accepted her for [facility] PT

## 2021-08-30 NOTE — Progress Notes (Signed)
   08/30/21 1331  Clinical Encounter Type  Visited With Patient  Visit Type Initial;Spiritual support  Referral From Chaplain  Consult/Referral To Chaplain  Spiritual Encounters  Spiritual Needs Emotional  Chaplain provided spiritual and emotional support for pt.

## 2021-08-30 NOTE — Plan of Care (Signed)
  Problem: Education: Goal: Knowledge of General Education information will improve Description: Including pain rating scale, medication(s)/side effects and non-pharmacologic comfort measures Outcome: Completed/Met   Problem: Health Behavior/Discharge Planning: Goal: Ability to manage health-related needs will improve Outcome: Completed/Met   Problem: Clinical Measurements: Goal: Ability to maintain clinical measurements within normal limits will improve Outcome: Completed/Met Goal: Will remain free from infection Outcome: Completed/Met Goal: Diagnostic test results will improve Outcome: Completed/Met Goal: Respiratory complications will improve Outcome: Completed/Met Goal: Cardiovascular complication will be avoided Outcome: Completed/Met   Problem: Activity: Goal: Risk for activity intolerance will decrease Outcome: Completed/Met   Problem: Nutrition: Goal: Adequate nutrition will be maintained Outcome: Completed/Met   Problem: Coping: Goal: Level of anxiety will decrease Outcome: Completed/Met   Problem: Elimination: Goal: Will not experience complications related to bowel motility Outcome: Completed/Met Goal: Will not experience complications related to urinary retention Outcome: Completed/Met   Problem: Pain Managment: Goal: General experience of comfort will improve Outcome: Completed/Met   Problem: Safety: Goal: Ability to remain free from injury will improve Outcome: Completed/Met   Problem: Skin Integrity: Goal: Risk for impaired skin integrity will decrease Outcome: Completed/Met   Problem: Activity: Goal: Ability to tolerate increased activity will improve Outcome: Completed/Met   Problem: Clinical Measurements: Goal: Ability to maintain a body temperature in the normal range will improve Outcome: Completed/Met   Problem: Respiratory: Goal: Ability to maintain adequate ventilation will improve Outcome: Completed/Met Goal: Ability to maintain a  clear airway will improve Outcome: Completed/Met   

## 2021-08-30 NOTE — Progress Notes (Signed)
Patient discharged home via personal vehicle. IV removed. All belongings sent with patient. Lantus script in packet. Commode, walker, oxygen tank, and concentrator sent with patient. Discharge went over with opportunity to ask questions.

## 2021-08-30 NOTE — Progress Notes (Signed)
SATURATION QUALIFICATIONS: (This note is used to comply with regulatory documentation for home oxygen)  Patient Saturations on Room Air at Rest = 91%  Patient Saturations on Room Air while Ambulating = 87%  Patient Saturations on 3 Liters of oxygen while Ambulating = 94%  Please briefly explain why patient needs home oxygen: COPD, ASTHMA, DYSPNEA

## 2021-08-30 NOTE — Discharge Summary (Signed)
Physician Discharge Summary  Kelly Dillon XIP:[id number] DOB: 1961/04/10 DOA: 08/26/2021  PCP: Pcp, No  Admit date: 08/26/2021 Discharge date: 08/30/2021  Admitted From: Home Disposition: Home  Recommendations for Outpatient Follow-up:  Follow up with PCP in 1-2 weeks Follow-up with pulmonology 1 to 2 weeks Please obtain BMP/CBC in one week Please follow up on the following pending results: None  Home Health: Yes Equipment/Devices: Home oxygen Discharge Condition: Stable CODE STATUS: Full Diet recommendation: Heart Healthy / Carb Modified   Brief/Interim Summary: Kelly Dillon is a 60 y.o. 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   Patient moved from [place] to [place] to live with her daughter in May.  Since then she has lost her Medicare and Medicaid and did not have her daily 3 L of oxygen.  Also has not been able to afford any of her previous medications including insulin.  For the past 2 weeks, she has been having nausea and vomiting and could not keep anything down. She was found to be febrile at 102 on presentation.  She met sepsis criteria with fever, tachycardia, tachypnea and leukocytosis.  Chest x-ray concerning for multifocal pneumonia.  COVID-19 and flu PCR came back negative.  Procalcitonin at 0.19.  Patient received ceftriaxone and Zithromax while in the hospital and discharged on Levaquin to complete the course. She was also given symptomatic medications for cough and we gave her new prescriptions for her prior inhalers.  She was also given new prescriptions for her insulin and metformin. She was given home oxygen with qualifying sats.  She needs to get established with a primary care provider and pulmonologist locally for further management of her chronic  conditions.  Discharge Diagnoses:  Principal Problem:   Sepsis ([place]) Active Problems:   Moderate COPD (chronic obstructive pulmonary disease) (HCC)   Fibromyalgia   Community acquired pneumonia of right lower lobe of lung   Acute on chronic respiratory failure with hypoxia (HCC)   Hypokalemia   Asthma   Type 2 diabetes mellitus ([place])   Discharge Instructions  Discharge Instructions     Diet - low sodium heart healthy   Complete by: As directed    Discharge instructions   Complete by: As directed    It was pleasure taking care of you. It is very important that you get established with a primary care doctor here so you can get all your medications appropriately. You are being given steroid for 3 more days-please check your blood sugar regularly as it can increase the levels. And also being given antibiotics for 3 more days to complete the course for your pneumonia. You are also being given 2 different types of cough medication, take Mucinex pill twice daily and use Tussionex syrup only at night for initial few days, you can stop taking them once your cough improves. You are also being given home oxygen, please use 2 L while resting and 4L when you are moving around. Keep yourself well-hydrated.   Increase activity slowly   Complete by: As directed       Allergies as of 08/30/2021   No Known Allergies      Medication List     STOP taking these medications    Atrovent HFA 17 MCG/ACT inhaler Generic drug: ipratropium   gabapentin 300 MG capsule Commonly known as: NEURONTIN   insulin lispro 100 UNIT/ML  KiwkPen Commonly known as: HumaLOG KwikPen   Lyrica 200 MG capsule Generic drug: pregabalin   methocarbamol 500 MG tablet Commonly known as: ROBAXIN   naproxen 500 MG tablet Commonly known as: Naprosyn   nicotine 14 mg/24hr patch Commonly known as: NICODERM CQ - dosed in mg/24 hours   oxyCODONE-acetaminophen 7.5-325 MG tablet Commonly known as: Percocet    Spacer/Aero Chamber Mouthpiece Misc   tiZANidine 2 MG tablet Commonly known as: ZANAFLEX   traZODone 100 MG tablet Commonly known as: DESYREL   triamcinolone cream 0.1 % Commonly known as: KENALOG       TAKE these medications    albuterol (2.5 MG/3ML) 0.083% nebulizer solution Commonly known as: PROVENTIL Inhale 3 mLs into the lungs every 6 (six) hours as needed. What changed: Another medication with the same name was removed. Continue taking this medication, and follow the directions you see here.   atorvastatin 40 MG tablet Commonly known as: LIPITOR Take 1 tablet (40 mg total) by mouth daily.   chlorpheniramine-HYDROcodone 10-8 MG/5ML Suer Commonly known as: TUSSIONEX Take 5 mLs by mouth every 12 (twelve) hours as needed for cough.   Combivent Respimat 20-100 MCG/ACT Aers respimat Generic drug: Ipratropium-Albuterol Inhale 1 puff into the lungs 2 (two) times daily. What changed: Another medication with the same name was removed. Continue taking this medication, and follow the directions you see here.   dextromethorphan-guaiFENesin 30-600 MG 12hr tablet Commonly known as: MUCINEX DM Take 1 tablet by mouth 2 (two) times daily.   fluticasone-salmeterol 250-50 MCG/ACT Aepb Commonly known as: Advair Diskus Inhale 1 puff into the lungs 2 (two) times daily. What changed: medication strength   insulin glargine 100 unit/mL Sopn Commonly known as: LANTUS Inject 15 Units into the skin at bedtime.   levofloxacin 750 MG tablet Commonly known as: Levaquin Take 1 tablet (750 mg total) by mouth daily for 3 days.   losartan 25 MG tablet Commonly known as: COZAAR Take 1 tablet (25 mg total) by mouth daily. Start taking on: August 31, 2021   metFORMIN 1000 MG tablet Commonly known as: GLUCOPHAGE Take 1 tablet (1,000 mg total) by mouth 2 (two) times daily.   omeprazole 20 MG capsule Commonly known as: PRILOSEC Take 1 capsule (20 mg total) by mouth daily.   predniSONE  50 MG tablet Commonly known as: DELTASONE Take 1 pill daily for next 3 days   venlafaxine XR 150 MG 24 hr capsule Commonly known as: EFFEXOR-XR Take 2 capsules (300 mg total) by mouth daily.               Durable Medical Equipment  (From admission, onward)           Start     Ordered   08/30/21 1116  For home use only DME oxygen  Once       Question Answer Comment  Length of Need Lifetime   Mode or (Route) Nasal cannula   Liters per Minute 3   Frequency Continuous (stationary and portable oxygen unit needed)   Oxygen conserving device Yes   Oxygen delivery system Gas      08/30/21 1116   08/30/21 1013  For home use only DME Walker rolling  Once       Question Answer Comment  Walker: With [place]   Patient needs a walker to treat with the following condition Weakness      08/30/21 1012            No Known Allergies  Consultations: None  Procedures/Studies: DG Chest 1 View  Result Date: 08/26/2021 CLINICAL DATA:  Increased vomiting and chills with weakness, initial encounter EXAM: PORTABLE CHEST 1 VIEW COMPARISON:  02/26/2019 FINDINGS: Cardiac shadow is stable. The lungs are well aerated bilaterally. Bilateral airspace opacities are noted in the bases consistent with multifocal pneumonia. No sizable effusion is seen. No bony abnormality is noted. IMPRESSION: Changes of multifocal pneumonia in the bases bilaterally. Electronically Signed   By: Inez Catalina M.D.   On: 08/26/2021 21:55    Subjective: Patient was seen and examined today.  Feeling much improved, continue to have some cough with deep breathing.  Again discussed the importance of getting established care locally so she can get her chronic conditions taken care.  Oxygen was already delivered at bedside.  Discharge Exam: Vitals:   08/30/21 0723 08/30/21 0838  BP:  130/85  Pulse:  96  Resp:  16  Temp:  98.1 F (36.7 C)  SpO2: 97% 97%   Vitals:   08/30/21 0456 08/30/21 0505 08/30/21  0723 08/30/21 0838  BP: (!) 149/97   130/85  Pulse: 91   96  Resp: 18   16  Temp: 97.9 F (36.6 C)   98.1 F (36.7 C)  TempSrc:    Oral  SpO2: 99% 97% 97% 97%  Weight:      Height:        General: Pt is alert, awake, not in acute distress Cardiovascular: RRR, S1/S2 +, no rubs, no gallops Respiratory: CTA bilaterally, no wheezing, no rhonchi Abdominal: Soft, NT, ND, bowel sounds + Extremities: no edema, no cyanosis   The results of significant diagnostics from this hospitalization (including imaging, microbiology, ancillary and laboratory) are listed below for reference.    Microbiology: Recent Results (from the past 240 hour(s))  Culture, blood (Routine x 2)     Status: None (Preliminary result)   Collection Time: 08/26/21  9:18 PM   Specimen: BLOOD  Result Value Ref Range Status   Specimen Description BLOOD RIGHT WRIST  Final   Special Requests   Final    BOTTLES DRAWN AEROBIC AND ANAEROBIC Blood Culture adequate volume   Culture   Final    NO GROWTH 4 DAYS Performed at [facility], [address], [place], [place] [address]    Report Status PENDING  Incomplete  Culture, blood (Routine x 2)     Status: None (Preliminary result)   Collection Time: 08/26/21  9:23 PM   Specimen: BLOOD  Result Value Ref Range Status   Specimen Description BLOOD RIGHT ANTECUBITAL  Final   Special Requests   Final    BOTTLES DRAWN AEROBIC AND ANAEROBIC Blood Culture adequate volume   Culture   Final    NO GROWTH 4 DAYS Performed at [facility], [address], [place], [place] [address]    Report Status PENDING  Incomplete  Resp Panel by RT-PCR (Flu A&B, Covid) Nasopharyngeal Swab     Status: None   Collection Time: 08/26/21  9:40 PM   Specimen: Nasopharyngeal Swab; Nasopharyngeal(NP) swabs in vial transport medium  Result Value Ref Range Status   SARS Coronavirus 2 by RT PCR NEGATIVE NEGATIVE Final    Comment: (NOTE) SARS-CoV-2 target nucleic acids are NOT  DETECTED.  The SARS-CoV-2 RNA is generally detectable in upper respiratory specimens during the acute phase of infection. The lowest concentration of SARS-CoV-2 viral copies this assay can detect is 138 copies/mL. A negative result does not preclude SARS-Cov-2 infection and should  not be used as the sole basis for treatment or other patient management decisions. A negative result may occur with  improper specimen collection/handling, submission of specimen other than nasopharyngeal swab, presence of viral mutation(s) within the areas targeted by this assay, and inadequate number of viral copies(<138 copies/mL). A negative result must be combined with clinical observations, patient history, and epidemiological information. The expected result is Negative.  Fact Sheet for Patients:  EntrepreneurPulse.com.au  Fact Sheet for Healthcare Providers:  IncredibleEmployment.be  This test is no t yet approved or cleared by the [place] FDA and  has been authorized for detection and/or diagnosis of SARS-CoV-2 by FDA under an Emergency Use Authorization (EUA). This EUA will remain  in effect (meaning this test can be used) for the duration of the COVID-19 declaration under Section 564(b)(1) of the Act, 21 U.S.C.section 360bbb-3(b)(1), unless the authorization is terminated  or revoked sooner.       Influenza A by PCR NEGATIVE NEGATIVE Final   Influenza B by PCR NEGATIVE NEGATIVE Final    Comment: (NOTE) The Xpert Xpress SARS-CoV-2/FLU/RSV plus assay is intended as an aid in the diagnosis of influenza from Nasopharyngeal swab specimens and should not be used as a sole basis for treatment. Nasal washings and aspirates are unacceptable for Xpert Xpress SARS-CoV-2/FLU/RSV testing.  Fact Sheet for Patients: EntrepreneurPulse.com.au  Fact Sheet for Healthcare Providers: IncredibleEmployment.be  This test is not yet  approved or cleared by the [place] FDA and has been authorized for detection and/or diagnosis of SARS-CoV-2 by FDA under an Emergency Use Authorization (EUA). This EUA will remain in effect (meaning this test can be used) for the duration of the COVID-19 declaration under Section 564(b)(1) of the Act, 21 U.S.C. section 360bbb-3(b)(1), unless the authorization is terminated or revoked.  Performed at [facility], [address] [place], [place] [address]      Labs: BNP (last 3 results) No results for input(s): BNP in the last 8760 hours. Basic Metabolic Panel: Recent Labs  Lab 08/26/21 2118 08/27/21 0633 08/28/21 0448  NA 132* 135 136  K 2.8* 2.8* 4.6  CL 94* 102 103  CO2 _0 GLUCOSE 155* 139* 222*  BUN 7 5* 7  CREATININE 0.65 0.51 0.71  CALCIUM 9.2 8.0* 8.9  MG  --  1.8 2.1   Liver Function Tests: Recent Labs  Lab 08/26/21 2118  AST 30  ALT 19  ALKPHOS 104  BILITOT 1.3*  PROT 9.0*  ALBUMIN 3.8   No results for input(s): LIPASE, AMYLASE in the last 168 hours. No results for input(s): AMMONIA in the last 168 hours. CBC: Recent Labs  Lab 08/26/21 2118 08/27/21 0633  WBC 15.9* 10.9*  NEUTROABS 13.2*  --   HGB 13.7 11.5*  HCT 38.3 32.9*  MCV 90.5 91.9  PLT 263 208   Cardiac Enzymes: No results for input(s): CKTOTAL, CKMB, CKMBINDEX, TROPONINI in the last 168 hours. BNP: Invalid input(s): POCBNP CBG: Recent Labs  Lab 08/29/21 1147 08/29/21 1640 08/29/21 2034 08/30/21 0753 08/30/21 1157  GLUCAP 156* 230* 141* 120* 135*   D-Dimer No results for input(s): DDIMER in the last 72 hours. Hgb A1c No results for input(s): HGBA1C in the last 72 hours. Lipid Profile No results for input(s): CHOL, HDL, LDLCALC, TRIG, CHOLHDL, LDLDIRECT in the last 72 hours. Thyroid function studies No results for input(s): TSH, T4TOTAL, T3FREE, THYROIDAB in the last 72 hours.  Invalid input(s): FREET3 Anemia work up No results for input(s):  VITAMINB12,  FOLATE, FERRITIN, TIBC, IRON, RETICCTPCT in the last 72 hours. Urinalysis    Component Value Date/Time   COLORURINE YELLOW (A) 08/27/2021 0031   APPEARANCEUR HAZY (A) 08/27/2021 0031   LABSPEC 1.004 (L) 08/27/2021 0031   PHURINE 6.0 08/27/2021 0031   GLUCOSEU NEGATIVE 08/27/2021 0031   HGBUR SMALL (A) 08/27/2021 0031   BILIRUBINUR NEGATIVE 08/27/2021 0031   KETONESUR NEGATIVE 08/27/2021 0031   PROTEINUR NEGATIVE 08/27/2021 0031   NITRITE NEGATIVE 08/27/2021 0031   LEUKOCYTESUR NEGATIVE 08/27/2021 0031   Sepsis Labs Invalid input(s): PROCALCITONIN,  WBC,  LACTICIDVEN Microbiology Recent Results (from the past 240 hour(s))  Culture, blood (Routine x 2)     Status: None (Preliminary result)   Collection Time: 08/26/21  9:18 PM   Specimen: BLOOD  Result Value Ref Range Status   Specimen Description BLOOD RIGHT WRIST  Final   Special Requests   Final    BOTTLES DRAWN AEROBIC AND ANAEROBIC Blood Culture adequate volume   Culture   Final    NO GROWTH 4 DAYS Performed at [facility], [address], [place], [place] [address]    Report Status PENDING  Incomplete  Culture, blood (Routine x 2)     Status: None (Preliminary result)   Collection Time: 08/26/21  9:23 PM   Specimen: BLOOD  Result Value Ref Range Status   Specimen Description BLOOD RIGHT ANTECUBITAL  Final   Special Requests   Final    BOTTLES DRAWN AEROBIC AND ANAEROBIC Blood Culture adequate volume   Culture   Final    NO GROWTH 4 DAYS Performed at [facility], [address] [place], [place] [address]    Report Status PENDING  Incomplete  Resp Panel by RT-PCR (Flu A&B, Covid) Nasopharyngeal Swab     Status: None   Collection Time: 08/26/21  9:40 PM   Specimen: Nasopharyngeal Swab; Nasopharyngeal(NP) swabs in vial transport medium  Result Value Ref Range Status   SARS Coronavirus 2 by RT PCR NEGATIVE NEGATIVE Final    Comment: (NOTE) SARS-CoV-2 target nucleic acids are NOT  DETECTED.  The SARS-CoV-2 RNA is generally detectable in upper respiratory specimens during the acute phase of infection. The lowest concentration of SARS-CoV-2 viral copies this assay can detect is 138 copies/mL. A negative result does not preclude SARS-Cov-2 infection and should not be used as the sole basis for treatment or other patient management decisions. A negative result may occur with  improper specimen collection/handling, submission of specimen other than nasopharyngeal swab, presence of viral mutation(s) within the areas targeted by this assay, and inadequate number of viral copies(<138 copies/mL). A negative result must be combined with clinical observations, patient history, and epidemiological information. The expected result is Negative.  Fact Sheet for Patients:  EntrepreneurPulse.com.au  Fact Sheet for Healthcare Providers:  IncredibleEmployment.be  This test is no t yet approved or cleared by the [place] FDA and  has been authorized for detection and/or diagnosis of SARS-CoV-2 by FDA under an Emergency Use Authorization (EUA). This EUA will remain  in effect (meaning this test can be used) for the duration of the COVID-19 declaration under Section 564(b)(1) of the Act, 21 U.S.C.section 360bbb-3(b)(1), unless the authorization is terminated  or revoked sooner.       Influenza A by PCR NEGATIVE NEGATIVE Final   Influenza B by PCR NEGATIVE NEGATIVE Final    Comment: (NOTE) The Xpert Xpress SARS-CoV-2/FLU/RSV plus assay is intended as an aid in the diagnosis of influenza from Nasopharyngeal swab specimens  and should not be used as a sole basis for treatment. Nasal washings and aspirates are unacceptable for Xpert Xpress SARS-CoV-2/FLU/RSV testing.  Fact Sheet for Patients: EntrepreneurPulse.com.au  Fact Sheet for Healthcare Providers: IncredibleEmployment.be  This test is not yet  approved or cleared by the [place] FDA and has been authorized for detection and/or diagnosis of SARS-CoV-2 by FDA under an Emergency Use Authorization (EUA). This EUA will remain in effect (meaning this test can be used) for the duration of the COVID-19 declaration under Section 564(b)(1) of the Act, 21 U.S.C. section 360bbb-3(b)(1), unless the authorization is terminated or revoked.  Performed at [facility], [address] [place], [place] [address]     Time coordinating discharge: Over 30 minutes  SIGNED:  Lorella Nimrod, MD  [facility] 08/30/2021, 12:05 PM  If 7PM-7AM, please contact night-coverage [email or web address]  This record has been created using Systems analyst. Errors have been sought and corrected,but may not always be located. Such creation errors do not reflect on the standard of care.

## 2021-08-30 NOTE — TOC Progression Note (Signed)
Transition of Care [facility]) - Progression Note    Patient Details  Name: Kelly Dillon MRN: [id number] Date of Birth: January 02, 1961  Transition of Care [facility]) CM/SW [place], RN Phone Number: 08/30/2021, 11:29 AM  Clinical Narrative:    [place] PCP group and made an appointment for new patient for 09/06/21 at 85 AM, Notified the patient of the appointment   Expected Discharge Plan: [place] Barriers to Discharge: Continued Medical Work up  Expected Discharge Plan and Services Expected Discharge Plan: [place]   Discharge Planning Services: CM Consult   Living arrangements for the past 2 months: Single Family Home                 DME Arranged: Walker rolling, Oxygen, 3-N-1 DME Agency: [employer] Date DME Agency Contacted: 08/30/21 Time DME Agency Contacted: [phone] Representative spoke with at DME Agency: [place]: PT [place]: [place] ([place]) Date [place]: 08/30/21 Time [place]: 1008 Representative spoke with at [place]: [place] ([place]) Interventions    Readmission Risk Interventions No flowsheet data found.

## 2021-08-31 LAB — CULTURE, BLOOD (ROUTINE X 2)
Culture: NO GROWTH
Culture: NO GROWTH
Special Requests: ADEQUATE
Special Requests: ADEQUATE

## 2021-08-31 NOTE — Care Management Important Message (Signed)
Important Message  Patient Details  Name: Kelly Dillon MRN: [id number] Date of Birth: June 10, 1961   Medicare Important Message Given:  Yes ((late documentation IM given on 10 10 22))     Camesha Farooq, [place] 08/31/2021, 11:01 AM

## 2021-12-17 ENCOUNTER — Other Ambulatory Visit: Payer: Self-pay

## 2021-12-17 ENCOUNTER — Telehealth: Payer: Self-pay | Admitting: Acute Care

## 2021-12-17 DIAGNOSIS — Z87891 Personal history of nicotine dependence: Secondary | ICD-10-CM

## 2021-12-17 DIAGNOSIS — F17210 Nicotine dependence, cigarettes, uncomplicated: Secondary | ICD-10-CM

## 2021-12-17 NOTE — Telephone Encounter (Signed)
Left detailed to call back to discuss appt for Lung screening CT scan. ( Pt had SDMV at [facility] on 03/23/20. Last LDCT was 03/23/20.) Will need f/u low dose ct scheduled.

## 2022-01-11 ENCOUNTER — Encounter: Payer: Medicaid Other | Admitting: Acute Care

## 2022-01-13 ENCOUNTER — Ambulatory Visit
Admission: RE | Admit: 2022-01-13 | Discharge: 2022-01-13 | Disposition: A | Discharge status: Home / Self Care | Payer: Medicare Other | Source: Ambulatory Visit | Attending: Acute Care | Admitting: Acute Care

## 2022-01-13 ENCOUNTER — Other Ambulatory Visit: Payer: Self-pay

## 2022-01-13 DIAGNOSIS — F17210 Nicotine dependence, cigarettes, uncomplicated: Secondary | ICD-10-CM | POA: Insufficient documentation

## 2022-01-13 DIAGNOSIS — Z87891 Personal history of nicotine dependence: Secondary | ICD-10-CM | POA: Diagnosis present

## 2022-01-31 ENCOUNTER — Other Ambulatory Visit: Payer: Self-pay

## 2022-01-31 DIAGNOSIS — F17210 Nicotine dependence, cigarettes, uncomplicated: Secondary | ICD-10-CM

## 2022-01-31 DIAGNOSIS — Z87891 Personal history of nicotine dependence: Secondary | ICD-10-CM

## 2022-02-08 ENCOUNTER — Other Ambulatory Visit: Payer: Self-pay

## 2022-02-08 ENCOUNTER — Encounter: Payer: Self-pay | Admitting: Gastroenterology

## 2022-02-08 ENCOUNTER — Ambulatory Visit (INDEPENDENT_AMBULATORY_CARE_PROVIDER_SITE_OTHER): Payer: Medicare HMO | Admitting: Gastroenterology

## 2022-02-08 VITALS — BP 160/98 | HR 84 | Temp 99.1°F | Wt 180.4 lb

## 2022-02-08 DIAGNOSIS — R194 Change in bowel habit: Secondary | ICD-10-CM | POA: Diagnosis not present

## 2022-02-08 DIAGNOSIS — F1090 Alcohol use, unspecified, uncomplicated: Secondary | ICD-10-CM | POA: Insufficient documentation

## 2022-02-08 DIAGNOSIS — R54 Age-related physical debility: Secondary | ICD-10-CM | POA: Insufficient documentation

## 2022-02-08 DIAGNOSIS — R634 Abnormal weight loss: Secondary | ICD-10-CM

## 2022-02-08 DIAGNOSIS — F321 Major depressive disorder, single episode, moderate: Secondary | ICD-10-CM | POA: Insufficient documentation

## 2022-02-08 DIAGNOSIS — K432 Incisional hernia without obstruction or gangrene: Secondary | ICD-10-CM | POA: Insufficient documentation

## 2022-02-08 DIAGNOSIS — Z9981 Dependence on supplemental oxygen: Secondary | ICD-10-CM | POA: Insufficient documentation

## 2022-02-08 DIAGNOSIS — D8481 Immunodeficiency due to conditions classified elsewhere: Secondary | ICD-10-CM | POA: Insufficient documentation

## 2022-02-08 DIAGNOSIS — Z794 Long term (current) use of insulin: Secondary | ICD-10-CM | POA: Insufficient documentation

## 2022-02-08 DIAGNOSIS — R1310 Dysphagia, unspecified: Secondary | ICD-10-CM | POA: Insufficient documentation

## 2022-02-08 DIAGNOSIS — K921 Melena: Secondary | ICD-10-CM

## 2022-02-08 DIAGNOSIS — Z720 Tobacco use: Secondary | ICD-10-CM | POA: Insufficient documentation

## 2022-02-08 DIAGNOSIS — F331 Major depressive disorder, recurrent, moderate: Secondary | ICD-10-CM | POA: Insufficient documentation

## 2022-02-08 DIAGNOSIS — K5792 Diverticulitis of intestine, part unspecified, without perforation or abscess without bleeding: Secondary | ICD-10-CM | POA: Insufficient documentation

## 2022-02-08 MED ORDER — CLENPIQ 10-3.5-12 MG-GM -GM/160ML PO SOLN: 1.0000 | Freq: Once | ORAL | 0 refills | Status: AC

## 2022-02-08 NOTE — Addendum Note (Signed)
Addended by: Wayna Chalet on: 02/08/2022 03:10 PM ? ? Modules accepted: Orders ? ?

## 2022-02-08 NOTE — Progress Notes (Signed)
Wyline Mood MD, MRCP(U.K) [address]  [address]  [place], [place] [address]  Main: [phone]  Fax: [phone]   Gastroenterology Consultation  Referring Provider:     Ignatius Specking, NP Primary Care Physician:  Pcp, No Primary Gastroenterologist:  Dr. Wyline Mood  Reason for Consultation:     Diverticulitis and melena.        HPI:   Kelly Dillon is a 61 y.o. y/o female referred for diverticulitis and melena.  Previously been seen by gastroenterologist at [facility] gastroenterology.  Last seen in January 2021 for dysphagia.  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  Prior barium swallow showed moderate to severe esophageal dysmotility delayed contrast transit to the lower esophagus with full clearance before minutes.  Spontaneous gastroesophageal reflux to the level of the thoracic inlet.  Barium tablet sensation of transit of the lower esophagus with no findings of stricture.  Esophageal myotomy was discussed but appears she did not follow-up subsequently.  In December 2021 she was referred to see [place] for colon polyps melena and diverticulitis. 01/13/2022: CT scan of the chest showed esophageal air-fluid levels suggestive dysmotility or GERD.  She states that back in 2005 she had a history of diverticulitis and had part of her colon taken out.  She has a history of colon polyps and has had multiple colonoscopies in the past.  She was told she should have 1 every year.  I do not have any records of the same.  For the past 5 to 6 months she has noticed a change in her bowel habits it is more pasty sometimes black in color sometimes red in color.  She has  loss of weight she says over 40 pounds which is unintentional 6 to 8 months.  She is diabetic.  Her HbA1c in October 2022 was 5.9..  No difficulty swallowing.  No NSAID use.  The entire visit was conducted via an interpreter.  She speaks only  [place].  Past Medical History:  Diagnosis Date   Chronic pain syndrome    extensive - see problem list   COPD (chronic obstructive pulmonary disease) (HCC)    Diabetes mellitus without complication (HCC)    Fibromyalgia    went to [facility] for monthly lidocaine infusions   Hypertension     Past Surgical History:  Procedure Laterality Date   APPENDECTOMY     HERNIA REPAIR     TUBAL LIGATION     VENTRAL HERNIA REPAIR N/A 08/15/2018   Procedure: HERNIA REPAIR VENTRAL ADULT;  Surgeon: Sung Amabile, DO;  Location: [facility] ORS;  Service: General;  Laterality: N/A;   VENTRAL HERNIA REPAIR N/A 08/18/2018   Procedure: LAPAROSCOPIC VENTRAL HERNIA;  Surgeon: Sung Amabile, DO;  Location: [facility] ORS;  Service: General;  Laterality: N/A;    Prior to Admission medications   Medication Sig Start Date End Date Taking? Authorizing Provider  albuterol (PROVENTIL) (2.5 MG/3ML) 0.083% nebulizer solution Inhale 3 mLs into the lungs every 6 (six) hours as needed. 08/30/21   Arnetha Courser, MD  atorvastatin (LIPITOR) 40 MG tablet Take 1 tablet by mouth daily. 09/19/19   [provider]  Blood Glucose Monitoring Suppl (FIFTY50 GLUCOSE METER 2.0) w/Device KIT Use as directed Product selection permitted according to insurance preference. E11.9 Type 2 diabetes mellitus 09/19/18   [provider]  celecoxib (CELEBREX) 200 MG capsule Take 1 capsule by mouth in the morning  and at bedtime. 04/13/20   [provider]  chlorpheniramine-HYDROcodone (TUSSIONEX) 10-8 MG/5ML SUER Take 5 mLs by mouth every 12 (twelve) hours as needed for cough. 08/30/21   Arnetha Courser, MD  dextromethorphan-guaiFENesin [facility] DM) 30-600 MG 12hr tablet Take 1 tablet by mouth 2 (two) times daily. 08/30/21   Arnetha Courser, MD  insulin aspart (NOVOLOG FLEXPEN) 100 UNIT/ML FlexPen Inject 7 Units into the skin 3 (three) times daily after meals.    [provider]  insulin glargine (LANTUS) 100 unit/mL SOPN  Inject 15 Units into the skin at bedtime. 08/30/21   Arnetha Courser, MD  levofloxacin (LEVAQUIN) 500 MG tablet Take 500 mg by mouth daily. 01/05/22   [provider]  losartan (COZAAR) 25 MG tablet Take 1 tablet by mouth 1 day or 1 dose.    [provider]  melatonin 5 MG TABS Take 1 tablet by mouth at bedtime as needed.    [provider]  metFORMIN (GLUCOPHAGE) 1000 MG tablet Take 1 tablet by mouth 2 (two) times daily with a meal.    [provider]  methocarbamol (ROBAXIN) 500 MG tablet Take 1 tablet by mouth 3 (three) times daily. 01/06/20   [provider]  montelukast (SINGULAIR) 10 MG tablet Take 1 tablet by mouth daily.    [provider]  Multiple Vitamins-Minerals (CENTRUM SILVER 50+WOMEN) TABS Take 1 capsule by mouth See admin instructions.    [provider]  nitroGLYCERIN (NITROSTAT) 0.4 MG SL tablet Place 1 tablet under the tongue once. 10/25/21   [provider]  omeprazole (PRILOSEC) 20 MG capsule Take 1 capsule (20 mg total) by mouth daily. 08/30/21   Arnetha Courser, MD  ondansetron (ZOFRAN-ODT) 4 MG disintegrating tablet DISSOLVE ONE TABLET ON THE TONGUE daily    [provider]  oxyCODONE-acetaminophen (PERCOCET) 7.5-325 MG tablet Take 1 tablet by mouth daily. 02/02/22   [provider]  OXYGEN 3LPM    [provider]  predniSONE (DELTASONE) 50 MG tablet Take 1 pill daily for next 3 days 08/30/21   Arnetha Courser, MD  pregabalin (LYRICA) 100 MG capsule Take 1 capsule by mouth 3 (three) times daily. 01/06/20   [provider]  senna-docusate (SENOKOT-S) 8.6-50 MG tablet Take 2 tablets by mouth in the morning and at bedtime. 01/17/20   [provider]  traZODone (DESYREL) 100 MG tablet Take 1 tablet by mouth at bedtime. 03/12/20   [provider]  TRELEGY ELLIPTA 100-62.5-25 MCG/ACT AEPB Take 1 puff by mouth daily. 09/21/21   [provider]  varenicline  (CHANTIX) 1 MG tablet Take 1 tablet by mouth 2 (two) times daily. 04/06/20   [provider]  venlafaxine XR (EFFEXOR-XR) 150 MG 24 hr capsule Take 2 capsules (300 mg total) by mouth daily. 08/30/21   Arnetha Courser, MD    No family history on file.   Social History   Tobacco Use   Smoking status: Former    Types: Cigarettes    Quit date: 02/19/2017    Years since quitting: 4.9   Smokeless tobacco: Never  Substance Use Topics   Alcohol use: No   Drug use: No    Allergies as of 02/08/2022   (No Known Allergies)    Review of Systems:    All systems reviewed and negative except where noted in HPI.   Physical Exam:  BP (!) 160/98   Pulse 84   Temp 99.1 F (37.3 C) (Oral)   Wt 180 lb 6.4 oz (  81.8 kg)   BMI 36.44 kg/m  No LMP recorded. Patient is postmenopausal. Psych:  Alert and cooperative. Normal mood and affect. General:   Alert,  Well-developed, well-nourished, pleasant and cooperative in NAD Head:  Normocephalic and atraumatic. Eyes:  Sclera clear, no icterus.   Conjunctiva pink. Ears:  Normal auditory acuity. Nose:  No deformity, discharge, or lesions. Lungs:  Respirations even and unlabored.  Clear throughout to auscultation.   No wheezes, crackles, or rhonchi. No acute distress. Heart:  Regular rate and rhythm; no murmurs, clicks, rubs, or gallops. Abdomen:  Normal bowel sounds.  No bruits.  Soft, non-tender and non-distended without masses, hepatosplenomegaly or hernias noted.  No guarding or rebound tenderness.    Neurologic:  Alert and oriented x3;  grossly normal neurologically. Psych:  Alert and cooperative. Normal mood and affect.  Imaging Studies: CT CHEST LUNG CA SCREEN LOW DOSE W/O CM  Result Date: 01/13/2022 CLINICAL DATA:  One hundred pack-year smoking history. Current smoker. EXAM: CT CHEST WITHOUT CONTRAST LOW-DOSE FOR LUNG CANCER SCREENING TECHNIQUE: Multidetector CT imaging of the chest was performed following the standard protocol without IV  contrast. RADIATION DOSE REDUCTION: This exam was performed according to the departmental dose-optimization program which includes automated exposure control, adjustment of the mA and/or kV according to patient size and/or use of iterative reconstruction technique. COMPARISON:  02/26/2019 CTA chest.  No prior screening CT. FINDINGS: Cardiovascular: Aortic atherosclerosis. Mild cardiomegaly, without pericardial effusion. Mediastinum/Nodes: No mediastinal or definite hilar adenopathy, given limitations of unenhanced CT. Mild esophageal dilatation with contrast level within on 22/2. Lungs/Pleura: No pleural fluid. Mild centrilobular emphysema. Areas of bilateral upper lobe predominant ground-glass and architectural distortion are present back on 02/26/2019 and may relate to remote infection or inflammation. This mildly decreases the sensitivity for pulmonary nodules. Subpleural lymph node or fissure thickening along the left major fissure measures volume derived equivalent diameter 8.1 mm. Right-sided calcified granulomas. Upper Abdomen: Ventral abdominal wall hernia repair. Normal imaged portions of the liver, spleen, stomach, pancreas, adrenal glands, kidneys. Musculoskeletal: Anterior right chest wall deformity could be developmental or posttraumatic. Convex right thoracic spine curvature is moderate. IMPRESSION: 1. Lung-RADS 2, benign appearance or behavior. Continue annual screening with low-dose chest CT without contrast in 12 months. 2. Esophageal air fluid level suggests dysmotility or gastroesophageal reflux. 3. Aortic Atherosclerosis (ICD10-I70.0) and Emphysema (ICD10-J43.9). Electronically Signed   By: Jeronimo Greaves M.D.   On: 01/13/2022 15:21   Assessment and Plan:   Haruye Lafrenier is a 61 y.o. y/o female has been referred for a history of diverticulitis but she mentions to me a change in her bowel habits with blood in her stool as well as melena.  She gives a history of unintentional weight loss of over 40  pounds over the past 6 to 8 months.  She is diabetic.  No recent HbA1c.  Plan 1.  Check CBC, TSH. 2.  EGD and colonoscopy 3.  Next visit if still continues to lose weight may need CT scan of the chest abdomen and pelvis   I have discussed alternative options, risks & benefits,  which include, but are not limited to, bleeding, infection, perforation,respiratory complication & drug reaction.  The patient agrees with this plan & written consent will be obtained.      Follow up in 8-12 weeks  Dr Wyline Mood MD,MRCP(U.K)

## 2022-02-09 LAB — CBC
Hematocrit: 38.4 % (ref 34.0–46.6)
Hemoglobin: 12.8 g/dL (ref 11.1–15.9)
MCH: 29.2 pg (ref 26.6–33.0)
MCHC: 33.3 g/dL (ref 31.5–35.7)
MCV: 88 fL (ref 79–97)
Platelets: 200 10*3/uL (ref 150–450)
RBC: 4.38 x10E6/uL (ref 3.77–5.28)
RDW: 12.9 % (ref 11.7–15.4)
WBC: 7.4 10*3/uL (ref 3.4–10.8)

## 2022-02-09 LAB — TSH: TSH: 2.11 u[IU]/mL (ref 0.450–4.500)

## 2022-02-10 ENCOUNTER — Encounter: Payer: Self-pay | Admitting: Nurse Practitioner

## 2022-02-24 ENCOUNTER — Encounter: Payer: Self-pay | Admitting: Gastroenterology

## 2022-03-01 ENCOUNTER — Ambulatory Visit: Payer: Medicare HMO | Admitting: Anesthesiology

## 2022-03-01 ENCOUNTER — Ambulatory Visit
Admission: RE | Admit: 2022-03-01 | Discharge: 2022-03-01 | Disposition: A | Discharge status: Home / Self Care | Payer: Medicare HMO | Attending: Gastroenterology | Admitting: Gastroenterology

## 2022-03-01 ENCOUNTER — Other Ambulatory Visit: Payer: Self-pay

## 2022-03-01 ENCOUNTER — Encounter: Payer: Self-pay | Admitting: Gastroenterology

## 2022-03-01 ENCOUNTER — Encounter
Admission: RE | Disposition: A | Discharge status: Home / Self Care | Payer: Self-pay | Source: Home / Self Care | Attending: Gastroenterology

## 2022-03-01 DIAGNOSIS — K921 Melena: Secondary | ICD-10-CM | POA: Diagnosis not present

## 2022-03-01 DIAGNOSIS — Z6837 Body mass index (BMI) 37.0-37.9, adult: Secondary | ICD-10-CM | POA: Diagnosis not present

## 2022-03-01 DIAGNOSIS — I10 Essential (primary) hypertension: Secondary | ICD-10-CM | POA: Insufficient documentation

## 2022-03-01 DIAGNOSIS — G894 Chronic pain syndrome: Secondary | ICD-10-CM | POA: Diagnosis not present

## 2022-03-01 DIAGNOSIS — D120 Benign neoplasm of cecum: Secondary | ICD-10-CM | POA: Insufficient documentation

## 2022-03-01 DIAGNOSIS — J449 Chronic obstructive pulmonary disease, unspecified: Secondary | ICD-10-CM | POA: Diagnosis not present

## 2022-03-01 DIAGNOSIS — K2289 Other specified disease of esophagus: Secondary | ICD-10-CM | POA: Insufficient documentation

## 2022-03-01 DIAGNOSIS — F17210 Nicotine dependence, cigarettes, uncomplicated: Secondary | ICD-10-CM | POA: Insufficient documentation

## 2022-03-01 DIAGNOSIS — E119 Type 2 diabetes mellitus without complications: Secondary | ICD-10-CM | POA: Diagnosis not present

## 2022-03-01 DIAGNOSIS — R634 Abnormal weight loss: Secondary | ICD-10-CM

## 2022-03-01 DIAGNOSIS — F419 Anxiety disorder, unspecified: Secondary | ICD-10-CM | POA: Diagnosis not present

## 2022-03-01 DIAGNOSIS — E669 Obesity, unspecified: Secondary | ICD-10-CM | POA: Diagnosis not present

## 2022-03-01 DIAGNOSIS — K635 Polyp of colon: Secondary | ICD-10-CM

## 2022-03-01 DIAGNOSIS — K219 Gastro-esophageal reflux disease without esophagitis: Secondary | ICD-10-CM | POA: Insufficient documentation

## 2022-03-01 DIAGNOSIS — R194 Change in bowel habit: Secondary | ICD-10-CM

## 2022-03-01 DIAGNOSIS — D123 Benign neoplasm of transverse colon: Secondary | ICD-10-CM | POA: Diagnosis not present

## 2022-03-01 DIAGNOSIS — M797 Fibromyalgia: Secondary | ICD-10-CM | POA: Insufficient documentation

## 2022-03-01 HISTORY — PX: COLONOSCOPY WITH PROPOFOL: SHX5780

## 2022-03-01 HISTORY — PX: ESOPHAGOGASTRODUODENOSCOPY: SHX5428

## 2022-03-01 LAB — GLUCOSE, CAPILLARY: Glucose-Capillary: 93 mg/dL (ref 70–99)

## 2022-03-01 SURGERY — COLONOSCOPY WITH PROPOFOL
Anesthesia: General

## 2022-03-01 MED ORDER — SODIUM CHLORIDE 0.9 % IV SOLN: INTRAVENOUS | Status: DC

## 2022-03-01 MED ORDER — PROPOFOL 10 MG/ML IV BOLUS
INTRAVENOUS | Status: DC | PRN
  Administered 2022-03-01: 100 mg via INTRAVENOUS
  Administered 2022-03-01: 10 mg via INTRAVENOUS

## 2022-03-01 MED ORDER — PROPOFOL 500 MG/50ML IV EMUL
INTRAVENOUS | Status: DC | PRN
  Administered 2022-03-01: 200 ug/kg/min via INTRAVENOUS

## 2022-03-01 NOTE — Anesthesia Procedure Notes (Signed)
Date/Time: 03/01/2022 10:14 AM ?Performed by: Nelda Marseille, CRNA ?Pre-anesthesia Checklist: Patient identified, Emergency Drugs available, Suction available, Patient being monitored and Timeout performed ?Oxygen Delivery Method: Simple face mask ? ? ? ? ?

## 2022-03-01 NOTE — Op Note (Signed)
[facility] ?Gastroenterology ?Patient Name: Kelly Dillon ?Procedure Date: 03/01/2022 9:53 AM ?MRN: [id number] ?Account #: [id number] ?Date of Birth: 05/20/1961 ?Admit Type: Outpatient ?Age: 61 ?Room: [facility] ENDO ROOM 2 ?Gender: Female ?Note Status: Finalized ?Instrument Name: Colonoscope [id number] ?Procedure:             Colonoscopy ?Indications:           Change in bowel habits ?Providers:             Jonathon Bellows MD, MD ?Referring MD:          Forest Gleason Md, MD (Referring MD) ?Medicines:             Monitored Anesthesia Care ?Complications:         No immediate complications. ?Procedure:             Pre-Anesthesia Assessment: ?                       - Prior to the procedure, a History and Physical was  ?                       performed, and patient medications, allergies and  ?                       sensitivities were reviewed. The patient's tolerance  ?                       of previous anesthesia was reviewed. ?                       - The risks and benefits of the procedure and the  ?                       sedation options and risks were discussed with the  ?                       patient. All questions were answered and informed  ?                       consent was obtained. ?                       - ASA Grade Assessment: II - A patient with mild  ?                       systemic disease. ?                       After obtaining informed consent, the colonoscope was  ?                       passed under direct vision. Throughout the procedure,  ?                       the patient's blood pressure, pulse, and oxygen  ?                       saturations were monitored continuously. The  ?                       Colonoscope was introduced  through the anus and  ?                       advanced to the the cecum, identified by the  ?                       appendiceal orifice. The colonoscopy was performed  ?                       with ease. The patient tolerated the procedure well.  ?                       The  quality of the bowel preparation was adequate. ?Findings: ?     The perianal and digital rectal examinations were normal. ?     Two sessile polyps were found in the transverse colon and cecum. The  ?     polyps were 6 to 8 mm in size. These polyps were removed with a cold  ?     snare. Resection and retrieval were complete. ?     The exam was otherwise without abnormality on direct and retroflexion  ?     views. ?Impression:            - Two 6 to 8 mm polyps in the transverse colon and in  ?                       the cecum, removed with a cold snare. Resected and  ?                       retrieved. ?                       - The examination was otherwise normal on direct and  ?                       retroflexion views. ?Recommendation:        - Discharge patient to home (with escort). ?                       - Resume previous diet. ?                       - Continue present medications. ?                       - Await pathology results. ?                       - Repeat colonoscopy in 5 years for surveillance based  ?                       on pathology results. ?Procedure Code(s):     --- Professional --- ?                       [phone], Colonoscopy, flexible; with removal of  ?                       tumor(s), polyp(s), or other lesion(s) by snare  ?  technique ?Diagnosis Code(s):     --- Professional --- ?                       K63.5, Polyp of colon ?                       R19.4, Change in bowel habit ?CPT copyright 2019 American Medical Association. All rights reserved. ?The codes documented in this report are preliminary and upon coder review may  ?be revised to meet current compliance requirements. ?Jonathon Bellows, MD ?Jonathon Bellows MD, MD ?03/01/2022 10:40:29 AM ?This report has been signed electronically. ?Number of Addenda: 0 ?Note Initiated On: 03/01/2022 9:53 AM ?Scope Withdrawal Time: 0 hours 15 minutes 4 seconds  ?Total Procedure Duration: 0 hours 17 minutes 5 seconds  ?Estimated Blood Loss:   Estimated blood loss: none. ?     [facility] ?

## 2022-03-01 NOTE — Anesthesia Postprocedure Evaluation (Signed)
Anesthesia Post Note ? ?Patient: Dutchess Crosland ? ?Procedure(s) Performed: COLONOSCOPY WITH PROPOFOL ?ESOPHAGOGASTRODUODENOSCOPY (EGD) ? ?Patient location during evaluation: Endoscopy ?Anesthesia Type: General ?Level of consciousness: awake and alert ?Pain management: pain level controlled ?Vital Signs Assessment: post-procedure vital signs reviewed and stable ?Respiratory status: spontaneous breathing, nonlabored ventilation and respiratory function stable ?Cardiovascular status: blood pressure returned to baseline and stable ?Postop Assessment: no apparent nausea or vomiting ?Anesthetic complications: no ? ? ?No notable events documented. ? ? ?Last Vitals:  ?Vitals:  ? 03/01/22 1043 03/01/22 1054  ?BP:  108/86  ?Pulse:    ?Resp:    ?Temp: (!) 35.9 ?C (!) 35.7 ?C  ?SpO2:    ?  ?Last Pain:  ?Vitals:  ? 03/01/22 1117  ?TempSrc:   ?PainSc: 7   ? ? ?  ?  ?  ?  ?  ?  ? ?[place] Ouch ? ? ? ? ?

## 2022-03-01 NOTE — Transfer of Care (Signed)
Immediate Anesthesia Transfer of Care Note ? ?Patient: Kelly Dillon ? ?Procedure(s) Performed: COLONOSCOPY WITH PROPOFOL ?ESOPHAGOGASTRODUODENOSCOPY (EGD) ? ?Patient Location: PACU ? ?Anesthesia Type:General ? ?Level of Consciousness: awake, alert  and oriented ? ?Airway & Oxygen Therapy: Patient Spontanous Breathing and Patient connected to face mask oxygen ? ?Post-op Assessment: Report given to RN and Post -op Vital signs reviewed and stable ? ?Post vital signs: Reviewed and stable ? ?Last Vitals:  ?Vitals Value Taken Time  ?BP 90/55 03/01/22 1045  ?Temp 35.9 ?C 03/01/22 1043  ?Pulse 79 03/01/22 1046  ?Resp 22 03/01/22 1046  ?SpO2 100 % 03/01/22 1046  ?Vitals shown include unvalidated device data. ? ?Last Pain:  ?Vitals:  ? 03/01/22 1043  ?TempSrc: Temporal  ?PainSc: 0-No pain  ?   ? ?  ? ?Complications: No notable events documented. ?

## 2022-03-01 NOTE — H&P (Signed)
? ? ? ?Jonathon Bellows, MD ?[address] [place], [place], [place], [address] ?[address], [place], [place], [place], [address] ?Phone: [phone]  ?Fax: [phone] ? ?Primary Care Physician:  System, Provider Not In ? ? ?Pre-Procedure History & Physical: ?HPI:  Kelly Dillon is a 61 y.o. female is here for an endoscopy and colonoscopy  ?  ?Past Medical History:  ?Diagnosis Date  ? Chronic pain syndrome   ? extensive - see problem list  ? COPD (chronic obstructive pulmonary disease) ([place])   ? Diabetes mellitus without complication ([place])   ? Fibromyalgia   ? went to [facility] for monthly lidocaine infusions  ? Hypertension   ? ? ?Past Surgical History:  ?Procedure Laterality Date  ? APPENDECTOMY    ? COLONOSCOPY    ? ESOPHAGOGASTRODUODENOSCOPY    ? HERNIA REPAIR    ? TUBAL LIGATION    ? VENTRAL HERNIA REPAIR N/A 08/15/2018  ? Procedure: HERNIA REPAIR VENTRAL ADULT;  Surgeon: Benjamine Sprague, DO;  Location: [facility] ORS;  Service: General;  Laterality: N/A;  ? VENTRAL HERNIA REPAIR N/A 08/18/2018  ? Procedure: LAPAROSCOPIC VENTRAL HERNIA;  Surgeon: Benjamine Sprague, DO;  Location: [facility] ORS;  Service: General;  Laterality: N/A;  ? ? ?Prior to Admission medications   ?Medication Sig Start Date End Date Taking? Authorizing Provider  ?albuterol (PROVENTIL) (2.5 MG/3ML) 0.083% nebulizer solution Inhale 3 mLs into the lungs every 6 (six) hours as needed. 08/30/21  Yes Lorella Nimrod, MD  ?insulin glargine (LANTUS) 100 unit/mL SOPN Inject 15 Units into the skin at bedtime. 08/30/21  Yes Lorella Nimrod, MD  ?losartan (COZAAR) 25 MG tablet Take 1 tablet by mouth 1 day or 1 dose.   Yes [provider]  ?metFORMIN (GLUMETZA) 1000 MG (MOD) 24 hr tablet Take 1,000 mg by mouth 2 (two) times daily with a meal.   Yes [provider]  ?nitroGLYCERIN (NITROSTAT) 0.4 MG SL tablet Place 1 tablet under the tongue once. 10/25/21  Yes [provider]  ?oxyCODONE-acetaminophen (PERCOCET) 7.5-325 MG tablet Take 1 tablet  by mouth daily. 02/02/22  Yes [provider]  ?OXYGEN 3LPM   Yes [provider]  ?pregabalin (LYRICA) 100 MG capsule Take 1 capsule by mouth 3 (three) times daily. 01/06/20  Yes [provider]  ?venlafaxine XR (EFFEXOR-XR) 150 MG 24 hr capsule Take 2 capsules (300 mg total) by mouth daily. 08/30/21  Yes Lorella Nimrod, MD  ?atorvastatin (LIPITOR) 40 MG tablet Take 1 tablet by mouth daily. 09/19/19   [provider]  ?Blood Glucose Monitoring Suppl (FIFTY50 GLUCOSE METER 2.0) w/Device KIT Use as directed Product selection permitted according to insurance preference. E11.9 Type 2 diabetes mellitus 09/19/18   [provider]  ?celecoxib (CELEBREX) 200 MG capsule Take 1 capsule by mouth in the morning and at bedtime. 04/13/20   [provider]  ?chlorpheniramine-HYDROcodone (TUSSIONEX) 10-8 MG/5ML SUER Take 5 mLs by mouth every 12 (twelve) hours as needed for cough. 08/30/21   Lorella Nimrod, MD  ?dextromethorphan-guaiFENesin [facility] DM) 30-600 MG 12hr tablet Take 1 tablet by mouth 2 (two) times daily. 08/30/21   Lorella Nimrod, MD  ?insulin aspart (NOVOLOG FLEXPEN) 100 UNIT/ML FlexPen Inject 7 Units into the skin 3 (three) times daily after meals.    [provider]  ?melatonin 5 MG TABS Take 1 tablet by mouth at bedtime as needed.    [provider]  ?metFORMIN (GLUCOPHAGE) 1000 MG tablet Take 1 tablet by mouth 2 (two) times daily  with a meal.    [provider]  ?methocarbamol (ROBAXIN) 500 MG tablet Take 1 tablet by mouth 3 (three) times daily. 01/06/20   [provider]  ?montelukast (SINGULAIR) 10 MG tablet Take 1 tablet by mouth daily.    [provider]  ?Multiple Vitamins-Minerals (CENTRUM SILVER 50+WOMEN) TABS Take 1 capsule by mouth See admin instructions.    [provider]  ?omeprazole (PRILOSEC) 20 MG capsule Take 1 capsule by mouth daily. 02/02/22   [provider]  ?ondansetron (ZOFRAN-ODT) 4  MG disintegrating tablet DISSOLVE ONE TABLET ON THE TONGUE daily    [provider]  ?predniSONE (DELTASONE) 50 MG tablet Take 1 pill daily for next 3 days 08/30/21   Lorella Nimrod, MD  ?senna-docusate (SENOKOT-S) 8.6-50 MG tablet Take 2 tablets by mouth in the morning and at bedtime. 01/17/20   [provider]  ?traZODone (DESYREL) 100 MG tablet Take 1 tablet by mouth at bedtime. 03/12/20   [provider]  ?Donnal Debar 100-62.5-25 MCG/ACT AEPB Take 1 puff by mouth daily. 09/21/21   [provider]  ?varenicline (CHANTIX) 1 MG tablet Take 1 tablet by mouth 2 (two) times daily. 04/06/20   [provider]  ? ? ?Allergies as of 02/08/2022  ? (No Known Allergies)  ? ? ?History reviewed. No pertinent family history. ? ?Social History  ? ?Socioeconomic History  ? Marital status: Divorced  ?  Spouse name: Not on file  ? Number of children: Not on file  ? Years of education: Not on file  ? Highest education level: Not on file  ?Occupational History  ? Not on file  ?Tobacco Use  ? Smoking status: Every Day  ?  Types: Cigarettes  ? Smokeless tobacco: Never  ? Tobacco comments:  ?  SMOKES 2-3 CIGARETTES QD  ?Vaping Use  ? Vaping Use: Never used  ?Substance and Sexual Activity  ? Alcohol use: No  ? Drug use: Yes  ?  Comment: CHRONIC PAIN ,ON PERCOCET  ? Sexual activity: Not on file  ?Other Topics Concern  ? Not on file  ?Social History Narrative  ? Not on file  ? ?Social Determinants of Health  ? ?Financial Resource Strain: Not on file  ?Food Insecurity: Not on file  ?Transportation Needs: Not on file  ?Physical Activity: Not on file  ?Stress: Not on file  ?Social Connections: Not on file  ?Intimate Partner Violence: Not on file  ? ? ?Review of Systems: ?See HPI, otherwise negative ROS ? ?Physical Exam: ?BP 134/83   Pulse 88   Temp (!) 97.3 ?F (36.3 ?C) (Temporal)   Resp 20   Ht _0  (1.499 m)   Wt 83.5 kg   SpO2 100%   BMI 37.16 kg/m?  ?General:   Alert,  pleasant and  cooperative in NAD ?Head:  Normocephalic and atraumatic. ?Neck:  Supple; no masses or thyromegaly. ?Lungs:  Clear throughout to auscultation, normal respiratory effort.    ?Heart:  +S1, +S2, Regular rate and rhythm, No edema. ?Abdomen:  Soft, nontender and nondistended. Normal bowel sounds, without guarding, and without rebound.   ?Neurologic:  Alert and  oriented x4;  grossly normal neurologically. ? ?Impression/Plan: ?Kelly Dillon is here for an endoscopy and colonoscopy  to be performed for  evaluation of melena and change in bowel habits ?   ?Risks, benefits, limitations, and alternatives regarding endoscopy have been reviewed with the patient.  Questions have been answered.  All parties agreeable. ? ? ?Jonathon Bellows,  MD  03/01/2022, 9:06 AM ? ?

## 2022-03-01 NOTE — Anesthesia Preprocedure Evaluation (Addendum)
Anesthesia Evaluation  ?Patient identified by MRN, date of birth, ID band ?Patient awake ? ? ? ?Reviewed: ?Allergy & Precautions, NPO status , Patient's Chart, lab work & pertinent test results ? ?Airway ?Mallampati: III ? ?TM Distance: >3 FB ?Neck ROM: full ? ? ? Dental ?no notable dental hx. ? ?  ?Pulmonary ?shortness of breath and with exertion, COPD,  COPD inhaler, Current Smoker and Patient abstained from smoking.,  ?  ?Pulmonary exam normal ? ? ? ? ? ? ? Cardiovascular ?hypertension, Normal cardiovascular exam ? ? ?  ?Neuro/Psych ?PSYCHIATRIC DISORDERS Anxiety negative neurological ROS ?   ? GI/Hepatic ?Neg liver ROS, GERD  Poorly Controlled and Medicated,  ?Endo/Other  ?negative endocrine ROSdiabetes ? Renal/GU ?negative Renal ROS  ?negative genitourinary ?  ?Musculoskeletal ? ?(+) Fibromyalgia - ? Abdominal ?(+) + obese,   ?Peds ? Hematology ?negative hematology ROS ?(+)   ?Anesthesia Other Findings ?Past Medical History: ?No date: Chronic pain syndrome ?    Comment:  extensive - see problem list ?No date: COPD (chronic obstructive pulmonary disease) ([place]) ?No date: Diabetes mellitus without complication ([place]) ?No date: Fibromyalgia ?    Comment:  went to [facility] pain clinic for monthly lidocaine infusions ?No date: Hypertension ? ?Past Surgical History: ?No date: APPENDECTOMY ?No date: COLONOSCOPY ?No date: ESOPHAGOGASTRODUODENOSCOPY ?No date: HERNIA REPAIR ?No date: TUBAL LIGATION ?08/15/2018: VENTRAL HERNIA REPAIR; N/A ?    Comment:  Procedure: HERNIA REPAIR VENTRAL ADULT;  Surgeon: Lysle Pearl, ?             Isami, DO;  Location: [facility] ORS;  Service: General;   ?             Laterality: N/A; ?08/18/2018: VENTRAL HERNIA REPAIR; N/A ?    Comment:  Procedure: LAPAROSCOPIC VENTRAL HERNIA;  Surgeon: Lysle Pearl, ?             Isami, DO;  Location: [facility] ORS;  Service: General;   ?             Laterality: N/A; ? ?BMI   ? Body Mass Index: 37.16 kg/m?  ?  ? ? Reproductive/Obstetrics ?negative OB  ROS ? ?  ? ? ? ? ? ? ? ? ? ? ? ? ? ?  ?  ? ? ? ? ? ? ? ?Anesthesia Physical ?Anesthesia Plan ? ?ASA: 3 ? ?Anesthesia Plan: General  ? ?Post-op Pain Management:   ? ?Induction: Intravenous ? ?PONV Risk Score and Plan: Propofol infusion and TIVA ? ?Airway Management Planned: Natural Airway ? ?Additional Equipment:  ? ?Intra-op Plan:  ? ?Post-operative Plan:  ? ?Informed Consent: I have reviewed the patients History and Physical, chart, labs and discussed the procedure including the risks, benefits and alternatives for the proposed anesthesia with the patient or authorized representative who has indicated his/her understanding and acceptance.  ? ? ? ?Dental Advisory Given ? ?Plan Discussed with: Anesthesiologist, CRNA and Surgeon ? ?Anesthesia Plan Comments:   ? ? ? ? ? ? ?Anesthesia Quick Evaluation ? ?

## 2022-03-01 NOTE — Op Note (Signed)
[facility] ?Gastroenterology ?Patient Name: Kelly Dillon ?Procedure Date: 03/01/2022 9:53 AM ?MRN: [id number] ?Account #: [id number] ?Date of Birth: 01-Jan-1961 ?Admit Type: Outpatient ?Age: 61 ?Room: [facility] ENDO ROOM 2 ?Gender: Female ?Note Status: Finalized ?Instrument Name: Upper Endoscope [id number] ?Procedure:             Upper GI endoscopy ?Indications:           Melena ?Providers:             Jonathon Bellows MD, MD ?Referring MD:          Forest Gleason Md, MD (Referring MD) ?Medicines:             Monitored Anesthesia Care ?Complications:         No immediate complications. ?Procedure:             Pre-Anesthesia Assessment: ?                       - Prior to the procedure, a History and Physical was  ?                       performed, and patient medications, allergies and  ?                       sensitivities were reviewed. The patient's tolerance  ?                       of previous anesthesia was reviewed. ?                       - The risks and benefits of the procedure and the  ?                       sedation options and risks were discussed with the  ?                       patient. All questions were answered and informed  ?                       consent was obtained. ?                       - ASA Grade Assessment: II - A patient with mild  ?                       systemic disease. ?                       After obtaining informed consent, the endoscope was  ?                       passed under direct vision. Throughout the procedure,  ?                       the patient's blood pressure, pulse, and oxygen  ?                       saturations were monitored continuously. The Endoscope  ?                       was introduced  through the mouth, and advanced to the  ?                       third part of duodenum. The upper GI endoscopy was  ?                       accomplished with ease. The patient tolerated the  ?                       procedure well. ?Findings: ?     The examined duodenum was normal. ?      The stomach was normal. ?     The cardia and gastric fundus were normal on retroflexion. ?     Islands of salmon-colored mucosa were present. No other visible  ?     abnormalities were present. Biopsies were taken with a cold forceps for  ?     histology. ?Impression:            - Normal examined duodenum. ?                       - Normal stomach. ?                       - Salmon-colored mucosa suspicious for Barrett's  ?                       esophagus. Biopsied. ?Recommendation:        - Await pathology results. ?                       - Perform a colonoscopy today. ?Procedure Code(s):     --- Professional --- ?                       [phone], Esophagogastroduodenoscopy, flexible,  ?                       transoral; with biopsy, single or multiple ?Diagnosis Code(s):     --- Professional --- ?                       K22.8, Other specified diseases of esophagus ?                       K92.1, Melena (includes Hematochezia) ?CPT copyright 2019 American Medical Association. All rights reserved. ?The codes documented in this report are preliminary and upon coder review may  ?be revised to meet current compliance requirements. ?Jonathon Bellows, MD ?Jonathon Bellows MD, MD ?03/01/2022 10:18:48 AM ?This report has been signed electronically. ?Number of Addenda: 0 ?Note Initiated On: 03/01/2022 9:53 AM ?Estimated Blood Loss:  Estimated blood loss: none. ?     [facility] ?

## 2022-03-02 ENCOUNTER — Encounter: Payer: Self-pay | Admitting: Gastroenterology

## 2022-03-02 LAB — SURGICAL PATHOLOGY

## 2022-03-14 ENCOUNTER — Encounter: Payer: Self-pay | Admitting: Gastroenterology

## 2022-05-26 ENCOUNTER — Other Ambulatory Visit: Payer: Self-pay

## 2022-05-26 ENCOUNTER — Ambulatory Visit: Payer: Medicare HMO | Admitting: Gastroenterology

## 2022-05-26 DIAGNOSIS — F323 Major depressive disorder, single episode, severe with psychotic features: Secondary | ICD-10-CM | POA: Insufficient documentation

## 2022-05-26 DIAGNOSIS — E785 Hyperlipidemia, unspecified: Secondary | ICD-10-CM | POA: Insufficient documentation

## 2022-05-26 DIAGNOSIS — J4540 Moderate persistent asthma, uncomplicated: Secondary | ICD-10-CM | POA: Insufficient documentation

## 2022-05-26 DIAGNOSIS — I10 Essential (primary) hypertension: Secondary | ICD-10-CM | POA: Insufficient documentation

## 2022-05-26 DIAGNOSIS — M5430 Sciatica, unspecified side: Secondary | ICD-10-CM | POA: Insufficient documentation

## 2022-05-26 DIAGNOSIS — M179 Osteoarthritis of knee, unspecified: Secondary | ICD-10-CM | POA: Insufficient documentation

## 2022-05-26 DIAGNOSIS — I700 Atherosclerosis of aorta: Secondary | ICD-10-CM | POA: Insufficient documentation

## 2022-05-26 DIAGNOSIS — N3281 Overactive bladder: Secondary | ICD-10-CM | POA: Insufficient documentation

## 2022-05-26 DIAGNOSIS — E782 Mixed hyperlipidemia: Secondary | ICD-10-CM | POA: Insufficient documentation

## 2022-07-31 IMAGING — CT CT CHEST LUNG CANCER SCREENING LOW DOSE W/O CM
2 of 5 series · 15 of 40 positions shown, 18 images · non-contrast
Comparison: 02/26/2019 CTA chest.  No prior screening CT.

CLINICAL DATA: One hundred pack-year smoking history. Current
smoker.



[Series 3: lung 1.00 · axial · 0.56mm/px · z∈[-1159,-928]mm · 12 of 255 slices shown, 15 images]
[im 12/255  mediastinal]
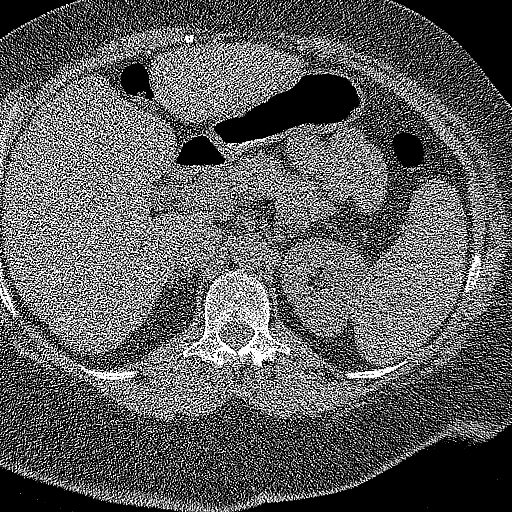
[im 12/255  lung]
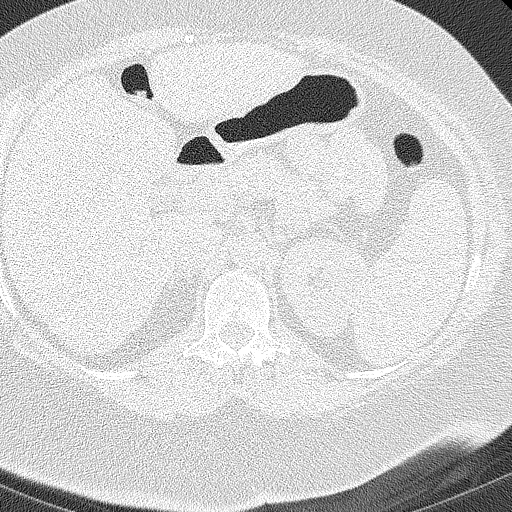
[im 35/255  lung]
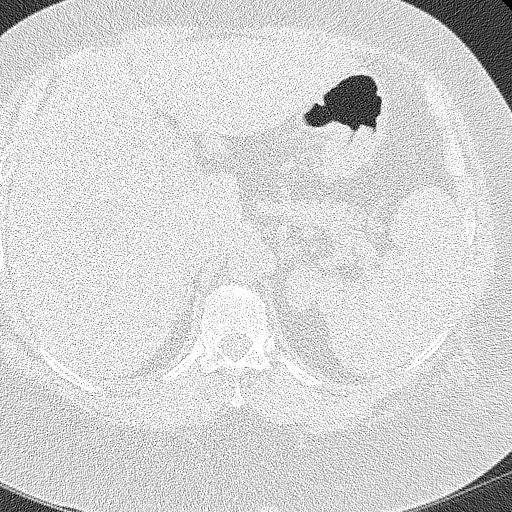
[im 58/255  lung]
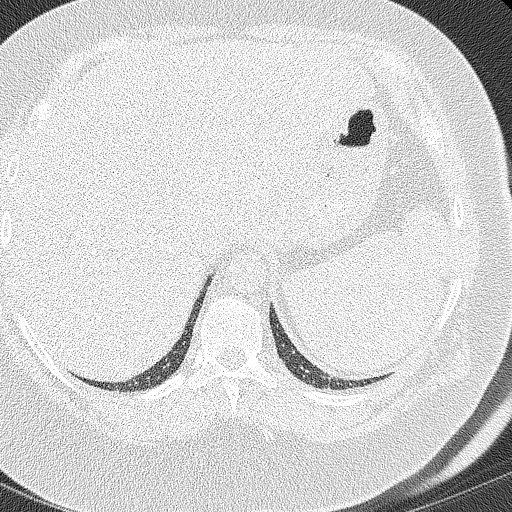
[im 81/255  lung]
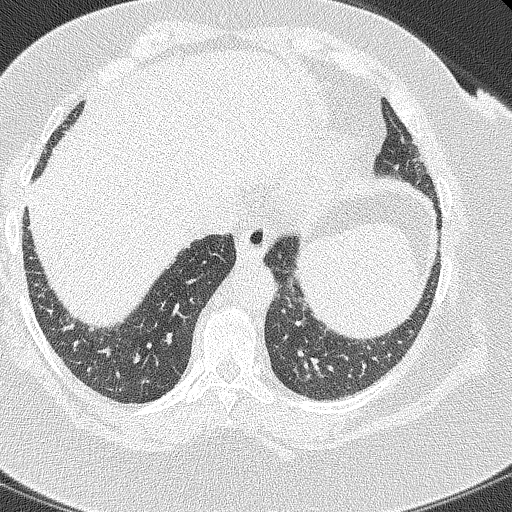
[im 93/255  mediastinal]
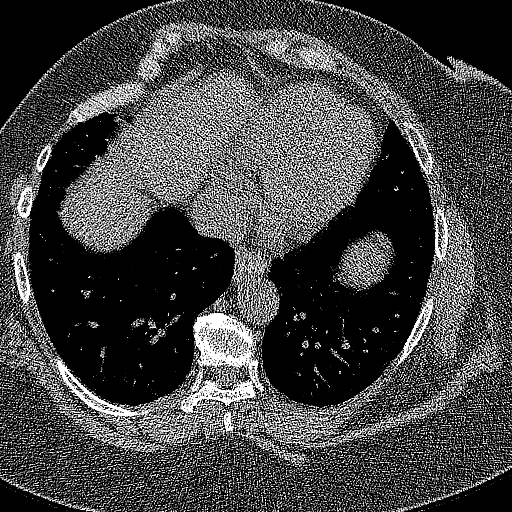
[im 93/255  lung]
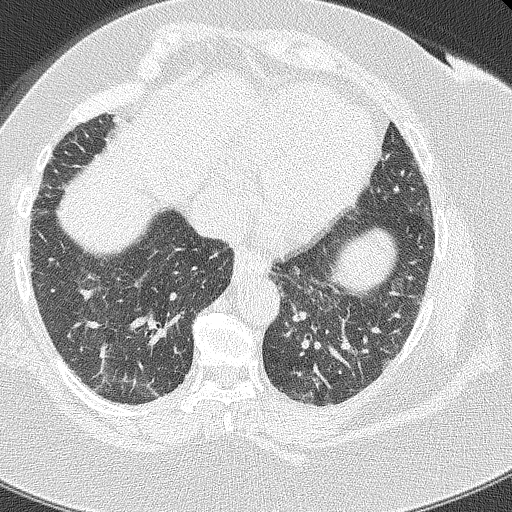
[im 116/255  lung]
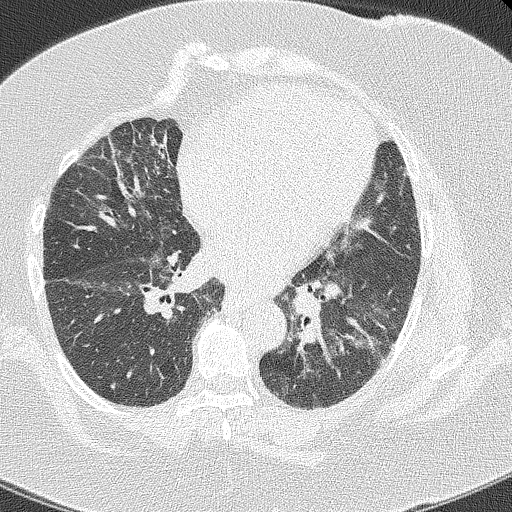
[im 139/255  lung]
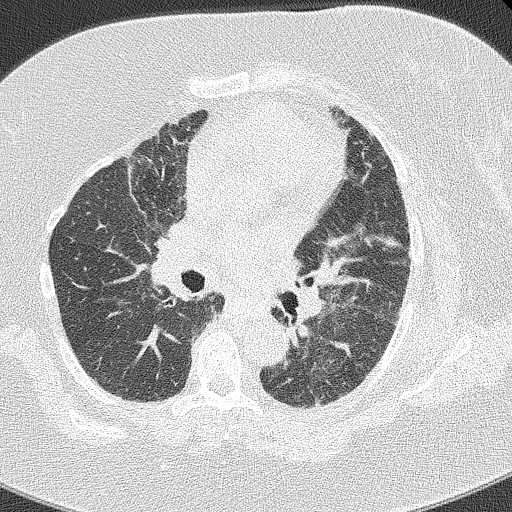
[im 162/255  lung]
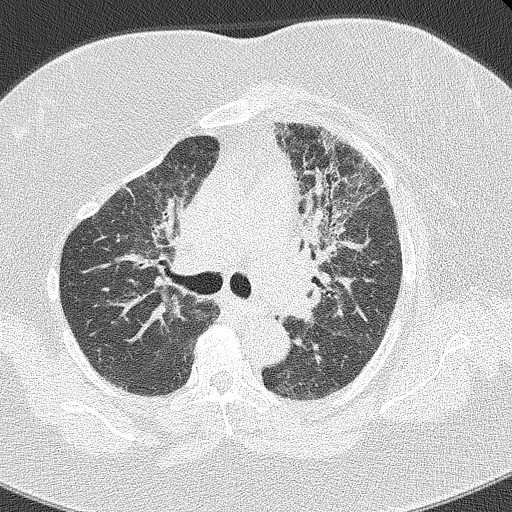
[im 174/255  mediastinal]
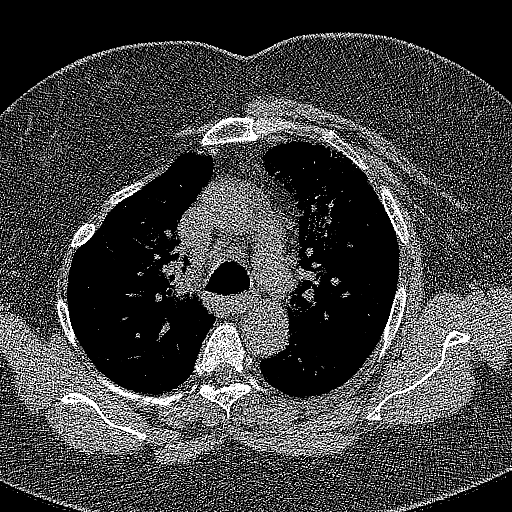
[im 174/255  lung]
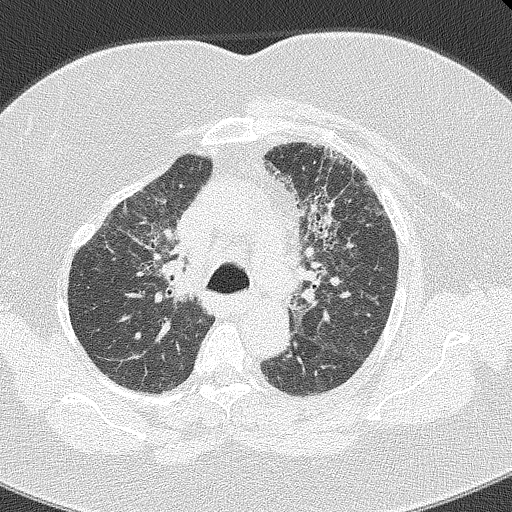
[im 197/255  lung]
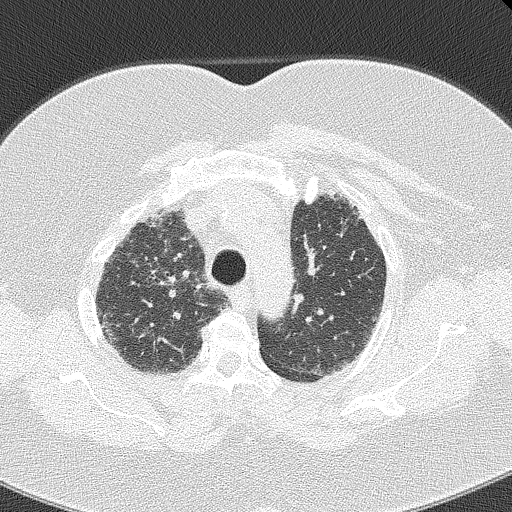
[im 220/255  lung]
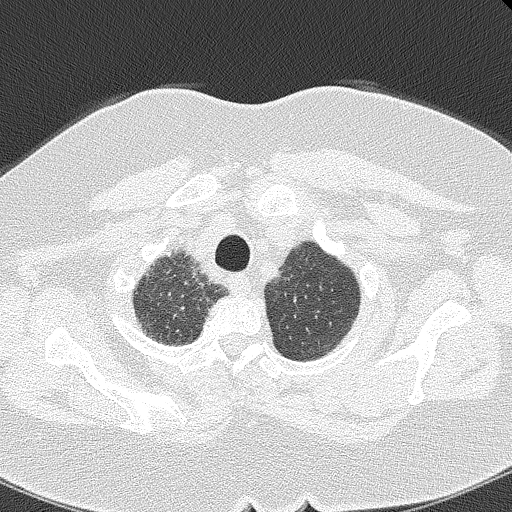
[im 243/255  lung]
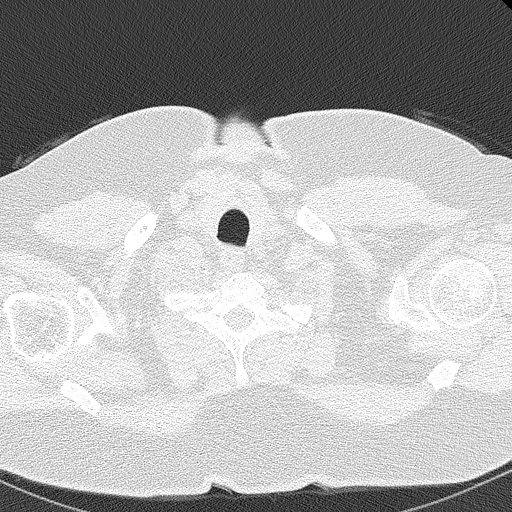

[Series 5: coronals lung 1.00 cor · coronal · 0.50mm/px · 3 of 288 slices shown]
[im 58/288  lung]
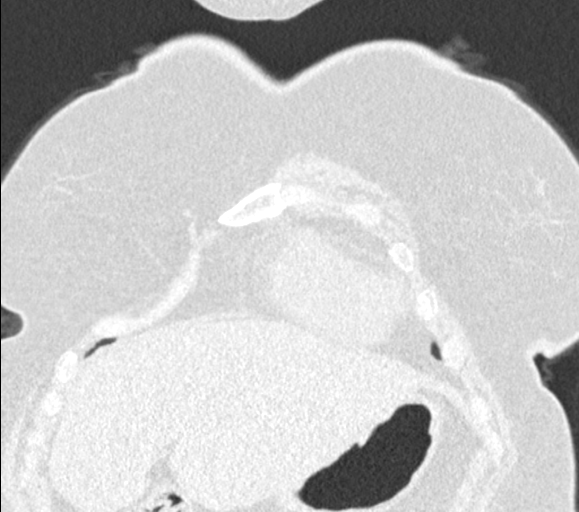
[im 115/288  lung]
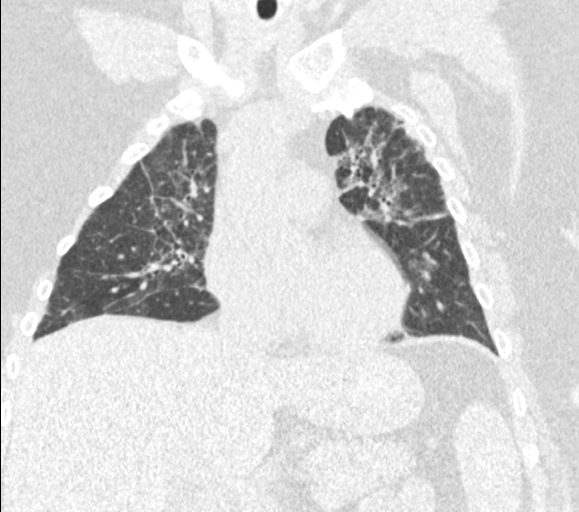
[im 173/288  lung]
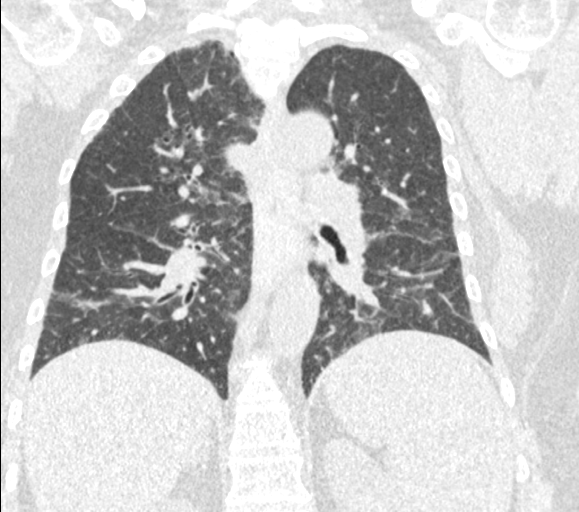

[15 of 40 positions shown; findings below may reference images not displayed]

FINDINGS: Cardiovascular: Aortic atherosclerosis. Mild cardiomegaly, without
pericardial effusion.

Mediastinum/Nodes: No mediastinal or definite hilar adenopathy,
given limitations of unenhanced CT.

Mild esophageal dilatation with contrast level within on [DATE].

Lungs/Pleura: No pleural fluid. Mild centrilobular emphysema. Areas
of bilateral upper lobe predominant ground-glass and architectural
distortion are present back on 02/26/2019 and may relate to remote
infection or inflammation. This mildly decreases the sensitivity for
pulmonary nodules. Subpleural lymph node or fissure thickening along
the left major fissure measures volume derived equivalent diameter
8.1 mm. Right-sided calcified granulomas.

Upper Abdomen: Ventral abdominal wall hernia repair. Normal imaged
portions of the liver, spleen, stomach, pancreas, adrenal glands,
kidneys.

Musculoskeletal: Anterior right chest wall deformity could be
developmental or posttraumatic. Convex right thoracic spine
curvature is moderate.
IMPRESSION: 1. Lung-RADS 2, benign appearance or behavior. Continue annual
screening with low-dose chest CT without contrast in 12 months.
2. Esophageal air fluid level suggests dysmotility or
gastroesophageal reflux.
3. Aortic Atherosclerosis (J6C3O-WNC.C) and Emphysema (J6C3O-63S.I).

## 2022-12-26 ENCOUNTER — Other Ambulatory Visit: Payer: Self-pay

## 2022-12-26 MED ORDER — TRAZODONE HCL 100 MG PO TABS: 100.0000 mg | ORAL_TABLET | Freq: Every day | ORAL | 3 refills | Status: DC

## 2023-01-13 ENCOUNTER — Ambulatory Visit
Admission: RE | Admit: 2023-01-13 | Discharge: 2023-01-13 | Disposition: A | Discharge status: Home / Self Care | Payer: No Typology Code available for payment source | Source: Ambulatory Visit | Attending: Acute Care | Admitting: Acute Care

## 2023-01-13 DIAGNOSIS — Z87891 Personal history of nicotine dependence: Secondary | ICD-10-CM | POA: Diagnosis present

## 2023-01-13 DIAGNOSIS — F17210 Nicotine dependence, cigarettes, uncomplicated: Secondary | ICD-10-CM | POA: Insufficient documentation

## 2023-02-02 ENCOUNTER — Telehealth: Payer: Self-pay | Admitting: Acute Care

## 2023-02-02 DIAGNOSIS — Z87891 Personal history of nicotine dependence: Secondary | ICD-10-CM

## 2023-02-02 DIAGNOSIS — Z122 Encounter for screening for malignant neoplasm of respiratory organs: Secondary | ICD-10-CM

## 2023-02-02 DIAGNOSIS — F17210 Nicotine dependence, cigarettes, uncomplicated: Secondary | ICD-10-CM

## 2023-02-02 NOTE — Telephone Encounter (Signed)
I have called the patient with the result of her low dose CT chest.  I explained that her scan was read as a lung RADS 2 so normal from a lung cancer perspective.  She will need a 4-monthfollow-up low-dose screening CT..Marland KitchenThere was an incidental finding of interstitial lung disease on her scan.  My plan had been [place] the [place] and is seeing a pulmonary specialist on April 12. I explained to her what interstitial lung disease was and that it needed to be followed by a pulmonary doctor.  I am mailing her the results of her low-dose CT so [place] pulmonary [place] of care.  Physician name in [place] Dr. PClarita Crane Rahim  Denise 179-monthollow-up low-dose screening CT, copy of low-dose CT results have been sent to patient who will take them to [place] pulmonologist [place]

## 2023-02-02 NOTE — Telephone Encounter (Signed)
Order placed for 12 mth lung screening CT.  

## 2023-07-27 ENCOUNTER — Encounter: Payer: Self-pay | Admitting: Physical Medicine & Rehabilitation

## 2023-08-14 ENCOUNTER — Other Ambulatory Visit: Payer: Self-pay

## 2023-08-14 ENCOUNTER — Ambulatory Visit (INDEPENDENT_AMBULATORY_CARE_PROVIDER_SITE_OTHER): Payer: No Typology Code available for payment source | Admitting: Cardiology

## 2023-08-14 ENCOUNTER — Encounter: Payer: Self-pay | Admitting: Cardiology

## 2023-08-14 VITALS — BP 120/78 | HR 101 | Ht 59.0 in | Wt 200.0 lb

## 2023-08-14 DIAGNOSIS — I10 Essential (primary) hypertension: Secondary | ICD-10-CM | POA: Diagnosis not present

## 2023-08-14 DIAGNOSIS — E119 Type 2 diabetes mellitus without complications: Secondary | ICD-10-CM | POA: Diagnosis not present

## 2023-08-14 DIAGNOSIS — J449 Chronic obstructive pulmonary disease, unspecified: Secondary | ICD-10-CM

## 2023-08-14 DIAGNOSIS — M797 Fibromyalgia: Secondary | ICD-10-CM | POA: Diagnosis not present

## 2023-08-14 DIAGNOSIS — E782 Mixed hyperlipidemia: Secondary | ICD-10-CM | POA: Diagnosis not present

## 2023-08-14 DIAGNOSIS — F17210 Nicotine dependence, cigarettes, uncomplicated: Secondary | ICD-10-CM | POA: Diagnosis not present

## 2023-08-14 MED ORDER — CELECOXIB 200 MG PO CAPS: 200.0000 mg | ORAL_CAPSULE | Freq: Two times a day (BID) | ORAL | 0 refills | Status: DC

## 2023-08-14 MED ORDER — VENLAFAXINE HCL ER 150 MG PO CP24: 300.0000 mg | ORAL_CAPSULE | Freq: Every day | ORAL | 1 refills | Status: DC

## 2023-08-14 MED ORDER — TRAZODONE HCL 100 MG PO TABS: 200.0000 mg | ORAL_TABLET | Freq: Every day | ORAL | 0 refills | Status: DC

## 2023-08-14 MED ORDER — ALBUTEROL SULFATE (2.5 MG/3ML) 0.083% IN NEBU: 3.0000 mL | INHALATION_SOLUTION | Freq: Four times a day (QID) | RESPIRATORY_TRACT | 12 refills | Status: AC | PRN

## 2023-08-14 MED ORDER — METHOCARBAMOL 500 MG PO TABS: 500.0000 mg | ORAL_TABLET | Freq: Three times a day (TID) | ORAL | 2 refills | Status: DC

## 2023-08-14 MED ORDER — OMEPRAZOLE 20 MG PO CPDR: 20.0000 mg | DELAYED_RELEASE_CAPSULE | Freq: Every day | ORAL | 1 refills | Status: DC

## 2023-08-14 MED ORDER — BENZONATATE 100 MG PO CAPS: 100.0000 mg | ORAL_CAPSULE | Freq: Three times a day (TID) | ORAL | 1 refills | Status: DC | PRN

## 2023-08-14 MED ORDER — INSULIN GLARGINE 100 UNITS/ML SOLOSTAR PEN: 15.0000 [IU] | PEN_INJECTOR | Freq: Every day | SUBCUTANEOUS | 0 refills | Status: DC

## 2023-08-14 MED ORDER — ATORVASTATIN CALCIUM 40 MG PO TABS: 40.0000 mg | ORAL_TABLET | Freq: Every day | ORAL | 1 refills | Status: AC

## 2023-08-14 MED ORDER — NITROGLYCERIN 0.4 MG SL SUBL: 0.4000 mg | SUBLINGUAL_TABLET | SUBLINGUAL | 3 refills | Status: AC | PRN

## 2023-08-14 MED ORDER — MUPIROCIN CALCIUM 2 % EX CREA: 1.0000 | TOPICAL_CREAM | Freq: Two times a day (BID) | CUTANEOUS | 1 refills | Status: DC

## 2023-08-14 MED ORDER — IPRATROPIUM-ALBUTEROL 20-100 MCG/ACT IN AERS: 1.0000 | INHALATION_SPRAY | Freq: Four times a day (QID) | RESPIRATORY_TRACT | 4 refills | Status: DC | PRN

## 2023-08-14 MED ORDER — HUMALOG KWIKPEN 200 UNIT/ML ~~LOC~~ SOPN: 12.0000 [IU] | PEN_INJECTOR | Freq: Three times a day (TID) | SUBCUTANEOUS | 6 refills | Status: DC

## 2023-08-14 MED ORDER — VARENICLINE TARTRATE (STARTER) 0.5 MG X 11 & 1 MG X 42 PO TBPK: ORAL_TABLET | ORAL | 0 refills | Status: DC

## 2023-08-14 MED ORDER — TROSPIUM CHLORIDE 20 MG PO TABS: 20.0000 mg | ORAL_TABLET | Freq: Every day | ORAL | 1 refills | Status: DC

## 2023-08-14 MED ORDER — FLUTICASONE-SALMETEROL 250-50 MCG/ACT IN AEPB: 1.0000 | INHALATION_SPRAY | Freq: Two times a day (BID) | RESPIRATORY_TRACT | 3 refills | Status: DC

## 2023-08-14 MED ORDER — LOSARTAN POTASSIUM 25 MG PO TABS: 25.0000 mg | ORAL_TABLET | ORAL | 0 refills | Status: DC

## 2023-08-14 MED ORDER — HYDROXYZINE HCL 50 MG PO TABS: 50.0000 mg | ORAL_TABLET | ORAL | 0 refills | Status: DC | PRN

## 2023-08-14 MED ORDER — METFORMIN HCL 1000 MG PO TABS: 1000.0000 mg | ORAL_TABLET | Freq: Two times a day (BID) | ORAL | 1 refills | Status: DC

## 2023-08-14 NOTE — Progress Notes (Signed)
Established Patient Office Visit  Subjective:  Patient ID: Kelly Dillon, female    DOB: August 07, 1961  Age: 62 y.o. MRN: [id number]  Chief Complaint  Patient presents with   Follow-up    1 year follow up    Patient in office for 2 year follow up. Recently diagnosed with bronchitis, was seen at [facility] urgent care, diagnosed with bronchitis, given doxycycline and prednisone. Patient presents today after not being seen for the past 2 years. Patient reports feeling better, continues to have a productive cough. Patient is a smoker but is interested in quitting. Has used Chantix in the past with some success. Will send in prescription for Chantix. Also recommend using mucinex. Sent in tessalon pearls.  Patient needing refills on all of her medications. Refills sent to the pharmacy.  Patient requesting refills on her Lyrica and oxycodone for which she takes for fibromyalgia. Referral sent to pain clinic.  No recent blood work. Lab orders placed, patient will return for fasting lab work.     No other concerns at this time.   Past Medical History:  Diagnosis Date   Chronic pain syndrome    extensive - see problem list   COPD (chronic obstructive pulmonary disease) (HCC)    Diabetes mellitus without complication (HCC)    Fibromyalgia    went to [facility] clinic for monthly lidocaine infusions   Hypertension     Past Surgical History:  Procedure Laterality Date   APPENDECTOMY     COLONOSCOPY     COLONOSCOPY WITH PROPOFOL N/A 03/01/2022   Procedure: COLONOSCOPY WITH PROPOFOL;  Surgeon: Wyline Mood, MD;  Location[facility]COPY;  Service: Gastroenterology;  Laterality: N/A;   ESOPHAGOGASTRODUODENOSCOPY     ESOPHAGOGASTRODUODENOSCOPY N/A 03/01/2022   Procedure: ESOPHAGOGASTRODUODENOSCOPY (EGD);  Surgeon: Wyline Mood, MD;  Location: [facility] ENDOSCOPY;  Service: Gastroenterology;  Laterality: N/A;   HERNIA REPAIR     TUBAL LIGATION     VENTRAL HERNIA REPAIR N/A 08/15/2018   Procedure:  HERNIA REPAIR VENTRAL ADULT;  Surgeon: Sung Amabile, DO;  Location: [facility] ORS;  Service: General;  Laterality: N/A;   VENTRAL HERNIA REPAIR N/A 08/18/2018   Procedure: LAPAROSCOPIC VENTRAL HERNIA;  Surgeon: Sung Amabile, DO;  Location: [facility] ORS;  Service: General;  Laterality: N/A;    Social History   Socioeconomic History   Marital status: Divorced    Spouse name: Not on file   Number of children: Not on file   Years of education: Not on file   Highest education level: Not on file  Occupational History   Not on file  Tobacco Use   Smoking status: Every Day    Types: Cigarettes   Smokeless tobacco: Never   Tobacco comments:    SMOKES 2-3 CIGARETTES QD  Vaping Use   Vaping status: Never Used  Substance and Sexual Activity   Alcohol use: No   Drug use: Yes    Comment: CHRONIC PAIN ,ON PERCOCET   Sexual activity: Not on file  Other Topics Concern   Not on file  Social History Narrative   Not on file   Social Determinants of Health   Financial Resource Strain: Medium Risk (11/22/2022)   Received from [facility], [facility]   Overall Financial Resource Strain (CARDIA)    Difficulty of Paying Living Expenses: Somewhat hard  Food Insecurity: Food Insecurity Present (11/22/2022)   [facility] from [facility], [facility]   Hunger Vital Sign    Worried  About Running Out of Food in the Last Year: Sometimes true    Ran Out of Food in the Last Year: Sometimes true  Transportation Needs: No Transportation Needs (11/22/2022)   Received from [facility], [facility]   [facility] - Transportation    In the past 12 months, has lack of transportation kept you from medical appointments or from getting medications?: No    Lack of Transportation (Non-Medical): No  Physical Activity: Insufficiently Active (08/21/2019)   Received from [facility], [facility]    Exercise Vital Sign    Days of Exercise per Week: 4 days    Minutes of Exercise per Session: 30 min  Stress: Stress Concern Present (08/21/2019)   Received from [facility], [facility]   [employer] of Occupational Health - Occupational Stress Questionnaire    Feeling of Stress : Very much  Social Connections: Unknown (08/21/2019)   Received from [facility], [facility]   Social Connection and Isolation Panel [NHANES]    Frequency of Communication with Friends and Family: Once a week    Frequency of Social Gatherings with Friends and Family: Once a week    Attends Religious Services: More than 4 times per year    Active Member of [employer] or Organizations: Not on file    Attends Banker Meetings: Not on file    Marital Status: Not on file  Intimate Partner Violence: Not on file    History reviewed. No pertinent family history.  No Known Allergies  Review of Systems  Constitutional: Negative.   HENT: Negative.    Eyes: Negative.   Respiratory: Negative.  Negative for shortness of breath.   Cardiovascular: Negative.  Negative for chest pain.  Gastrointestinal: Negative.  Negative for abdominal pain, constipation and diarrhea.  Genitourinary: Negative.   Musculoskeletal:  Negative for joint pain and myalgias.  Skin: Negative.   Neurological: Negative.  Negative for dizziness and headaches.  Endo/Heme/Allergies: Negative.   All other systems reviewed and are negative.      Objective:   BP 120/78   Pulse (!) 101   Ht 4\' 11"  (1.499 m)   Wt 200 lb (90.7 kg)   SpO2 97%   BMI 40.40 kg/m   Vitals:   08/14/23 1008  BP: 120/78  Pulse: (!) 101  Height: 4\' 11"  (1.499 m)  Weight: 200 lb (90.7 kg)  SpO2: 97%  BMI (Calculated): 40.37    Physical Exam Vitals and nursing note reviewed.  Constitutional:      Appearance: Normal appearance. She is normal weight.  HENT:     Head:  Normocephalic and atraumatic.     Nose: Nose normal.     Mouth/Throat:     Mouth: Mucous membranes are moist.  Eyes:     Extraocular Movements: Extraocular movements intact.     Conjunctiva/sclera: Conjunctivae normal.     Pupils: Pupils are equal, round, and reactive to light.  Cardiovascular:     Rate and Rhythm: Normal rate and regular rhythm.     Pulses: Normal pulses.     Heart sounds: Normal heart sounds.  Pulmonary:     Effort: Pulmonary effort is normal.     Breath sounds: Normal breath sounds.  Abdominal:     General: Abdomen is flat. Bowel sounds are normal.     Palpations: Abdomen is soft.  Musculoskeletal:        General: Normal  range of motion.     Cervical back: Normal range of motion.  Skin:    General: Skin is warm and dry.  Neurological:     General: No focal deficit present.     Mental Status: She is alert and oriented to person, place, and time.  Psychiatric:        Mood and Affect: Mood normal.        Behavior: Behavior normal.        Thought Content: Thought content normal.        Judgment: Judgment normal.      No results found for any visits on 08/14/23.  No results found for this or any previous visit (from the past 2160 hour(s)).    Assessment & Plan:  Medications refilled.  Return for fasting lab work.  Referral sent to pain clinic.  Mucinex and tessalon pearls for cough.  Chantix for smoking cessation.   Problem List Items Addressed This Visit       Cardiovascular and Mediastinum   Essential hypertension - Primary   Relevant Medications   nitroGLYCERIN (NITROSTAT) 0.4 MG SL tablet   atorvastatin (LIPITOR) 40 MG tablet   losartan (COZAAR) 25 MG tablet   Other Relevant Orders   CMP14+EGFR   CBC with Differential/Platelet   TSH     Respiratory   Moderate COPD (chronic obstructive pulmonary disease) (HCC)   Relevant Medications   Varenicline Tartrate, Starter, (CHANTIX STARTING MONTH PAK) 0.5 MG X 11 & 1 MG X 42 TBPK   albuterol  (PROVENTIL) (2.5 MG/3ML) 0.083% nebulizer solution   fluticasone-salmeterol (ADVAIR) 250-50 MCG/ACT AEPB   Ipratropium-Albuterol (COMBIVENT) 20-100 MCG/ACT AERS respimat   benzonatate (TESSALON PERLES) 100 MG capsule     Endocrine   Controlled type 2 diabetes mellitus without complication (HCC)   Relevant Medications   atorvastatin (LIPITOR) 40 MG tablet   insulin glargine (LANTUS) 100 unit/mL SOPN   insulin lispro (HUMALOG KWIKPEN) 200 UNIT/ML KwikPen   losartan (COZAAR) 25 MG tablet   metFORMIN (GLUCOPHAGE) 1000 MG tablet   Other Relevant Orders   Hemoglobin A1c   CBC with Differential/Platelet   TSH     Other   Fibromyalgia   Relevant Medications   venlafaxine XR (EFFEXOR-XR) 150 MG 24 hr capsule   celecoxib (CELEBREX) 200 MG capsule   methocarbamol (ROBAXIN) 500 MG tablet   traZODone (DESYREL) 100 MG tablet   Other Relevant Orders   Ambulatory referral to Pain Clinic   Mixed hyperlipidemia   Relevant Medications   nitroGLYCERIN (NITROSTAT) 0.4 MG SL tablet   atorvastatin (LIPITOR) 40 MG tablet   losartan (COZAAR) 25 MG tablet   Other Relevant Orders   Lipid panel   CBC with Differential/Platelet   TSH    Return in about 4 weeks (around 09/11/2023) for with Marchelle Folks, fasting labs prior.   Total time spent: 25 minutes  [employer], NP  08/14/2023   This document may have been prepared by Dragon Voice Recognition software and as such may include unintentional dictation errors.

## 2023-08-28 ENCOUNTER — Encounter: Payer: No Typology Code available for payment source | Admitting: Physical Medicine & Rehabilitation

## 2023-09-05 ENCOUNTER — Encounter: Payer: No Typology Code available for payment source | Admitting: Physical Medicine & Rehabilitation

## 2023-09-06 ENCOUNTER — Other Ambulatory Visit: Payer: Self-pay | Admitting: Cardiology

## 2023-09-11 ENCOUNTER — Encounter: Payer: Self-pay | Admitting: Family

## 2023-09-11 ENCOUNTER — Ambulatory Visit: Payer: No Typology Code available for payment source | Admitting: Family

## 2023-09-11 VITALS — BP 120/75 | HR 106 | Ht 59.0 in | Wt 196.0 lb

## 2023-09-11 DIAGNOSIS — Z114 Encounter for screening for human immunodeficiency virus [HIV]: Secondary | ICD-10-CM

## 2023-09-11 DIAGNOSIS — E119 Type 2 diabetes mellitus without complications: Secondary | ICD-10-CM

## 2023-09-11 DIAGNOSIS — Z1159 Encounter for screening for other viral diseases: Secondary | ICD-10-CM

## 2023-09-11 DIAGNOSIS — I10 Essential (primary) hypertension: Secondary | ICD-10-CM | POA: Diagnosis not present

## 2023-09-11 DIAGNOSIS — E1165 Type 2 diabetes mellitus with hyperglycemia: Secondary | ICD-10-CM

## 2023-09-11 DIAGNOSIS — E538 Deficiency of other specified B group vitamins: Secondary | ICD-10-CM

## 2023-09-11 DIAGNOSIS — J4540 Moderate persistent asthma, uncomplicated: Secondary | ICD-10-CM | POA: Diagnosis not present

## 2023-09-11 DIAGNOSIS — E782 Mixed hyperlipidemia: Secondary | ICD-10-CM | POA: Diagnosis not present

## 2023-09-11 DIAGNOSIS — M87052 Idiopathic aseptic necrosis of left femur: Secondary | ICD-10-CM

## 2023-09-11 DIAGNOSIS — M87051 Idiopathic aseptic necrosis of right femur: Secondary | ICD-10-CM

## 2023-09-11 DIAGNOSIS — J449 Chronic obstructive pulmonary disease, unspecified: Secondary | ICD-10-CM

## 2023-09-11 DIAGNOSIS — E559 Vitamin D deficiency, unspecified: Secondary | ICD-10-CM

## 2023-09-11 DIAGNOSIS — G894 Chronic pain syndrome: Secondary | ICD-10-CM

## 2023-09-11 LAB — POC CREATINE & ALBUMIN,URINE
Albumin/Creatinine Ratio, Urine, POC: <30
Creatinine, POC: 200 mg/dL
Microalbumin Ur, POC: 30 mg/L

## 2023-09-11 LAB — POCT CBG (FASTING - GLUCOSE)-MANUAL ENTRY: Glucose Fasting, POC: 115 mg/dL — AB (ref 70–99)

## 2023-09-11 NOTE — Progress Notes (Signed)
Established Patient Office Visit  Subjective:  Patient ID: Kelly Dillon, female    DOB: 06-28-1961  Age: 62 y.o. MRN: [id number]  Chief Complaint  Patient presents with   Follow-up    4 weeks    HPI  No other concerns at this time.   Past Medical History:  Diagnosis Date   Acute on chronic respiratory failure with hypoxia (HCC) 08/27/2021   Chronic pain syndrome    extensive - see problem list   Community acquired pneumonia of right lower lobe of lung 08/27/2021   COPD (chronic obstructive pulmonary disease) (HCC)    Diabetes mellitus without complication (HCC)    Fibromyalgia    went to [facility] for monthly lidocaine infusions   Hypertension    Sepsis (HCC) 08/26/2021    Past Surgical History:  Procedure Laterality Date   APPENDECTOMY     COLONOSCOPY     COLONOSCOPY WITH PROPOFOL N/A 03/01/2022   Procedure: COLONOSCOPY WITH PROPOFOL;  Surgeon: Wyline Mood, MD;  Location: [facility] ENDOSCOPY;  Service: Gastroenterology;  Laterality: N/A;   ESOPHAGOGASTRODUODENOSCOPY     ESOPHAGOGASTRODUODENOSCOPY N/A 03/01/2022   Procedure: ESOPHAGOGASTRODUODENOSCOPY (EGD);  Surgeon: Wyline Mood, MD;  Location: [facility] ENDOSCOPY;  Service: Gastroenterology;  Laterality: N/A;   HERNIA REPAIR     TUBAL LIGATION     VENTRAL HERNIA REPAIR N/A 08/15/2018   Procedure: HERNIA REPAIR VENTRAL ADULT;  Surgeon: Sung Amabile, DO;  Location: [facility] ORS;  Service: General;  Laterality: N/A;   VENTRAL HERNIA REPAIR N/A 08/18/2018   Procedure: LAPAROSCOPIC VENTRAL HERNIA;  Surgeon: Sung Amabile, DO;  Location: [facility] ORS;  Service: General;  Laterality: N/A;    Social History   Socioeconomic History   Marital status: Divorced    Spouse name: Not on file   Number of children: Not on file   Years of education: Not on file   Highest education level: Not on file  Occupational History   Not on file  Tobacco Use   Smoking status: Every Day    Types: Cigarettes   Smokeless tobacco: Never   Tobacco  comments:    SMOKES 2-3 CIGARETTES QD  Vaping Use   Vaping status: Never Used  Substance and Sexual Activity   Alcohol use: No   Drug use: Yes    Comment: CHRONIC PAIN ,ON PERCOCET   Sexual activity: Not on file  Other Topics Concern   Not on file  Social History Narrative   Not on file   Social Determinants of Health   Financial Resource Strain: Medium Risk (11/22/2022)   Received from [facility], [facility]   Overall Financial Resource Strain (CARDIA)    Difficulty of Paying Living Expenses: Somewhat hard  Food Insecurity: Food Insecurity Present (11/22/2022)   Received from [facility], [facility]   Hunger Vital Sign    Worried About Running Out of Food in the Last Year: Sometimes true    Ran Out of Food in the Last Year: Sometimes true  Transportation Needs: No Transportation Needs (11/22/2022)   Received from [facility], [facility]   [facility] - Transportation    In the past 12 months, has lack of transportation kept you from medical appointments or from getting medications?: No    Lack of Transportation (Non-Medical): No  Physical Activity: Insufficiently Active (08/21/2019)   Received from [facility], [facility]   Exercise Vital Sign    Days  of Exercise per Week: 4 days    Minutes of Exercise per Session: 30 min  Stress: Stress Concern Present (08/21/2019)   Received from [facility], [facility]   [facility] - Occupational Stress Questionnaire    Feeling of Stress : Very much  Social Connections: Unknown (08/21/2019)   Received from [facility], [facility]   Social Connection and Isolation Panel [NHANES]    Frequency of Communication with Friends and Family: Once a week    Frequency of Social Gatherings with Friends and Family:  Once a week    Attends Religious Services: More than 4 times per year    Active Member of [employer] or Organizations: Not on file    Attends Banker Meetings: Not on file    Marital Status: Not on file  Intimate Partner Violence: Not on file    History reviewed. No pertinent family history.  No Known Allergies  Review of Systems  All other systems reviewed and are negative.      Objective:   BP 120/75   Pulse (!) 106   Ht 4\' 11"  (1.499 m)   Wt 196 lb (88.9 kg)   SpO2 95%   BMI 39.59 kg/m   Vitals:   09/11/23 1124  BP: 120/75  Pulse: (!) 106  Height: 4\' 11"  (1.499 m)  Weight: 196 lb (88.9 kg)  SpO2: 95%  BMI (Calculated): 39.57    Physical Exam Vitals and nursing note reviewed.  Constitutional:      Appearance: Normal appearance. She is obese.  HENT:     Head: Normocephalic.  Eyes:     Extraocular Movements: Extraocular movements intact.     Conjunctiva/sclera: Conjunctivae normal.     Pupils: Pupils are equal, round, and reactive to light.  Cardiovascular:     Rate and Rhythm: Normal rate.     Pulses: Normal pulses.     Heart sounds: Normal heart sounds.  Pulmonary:     Effort: Pulmonary effort is normal.  Abdominal:     General: Abdomen is flat. Bowel sounds are normal.     Palpations: Abdomen is soft.  Musculoskeletal:        General: Normal range of motion.  Neurological:     General: No focal deficit present.     Mental Status: She is alert and oriented to person, place, and time. Mental status is at baseline.  Psychiatric:        Mood and Affect: Mood normal.        Behavior: Behavior normal.        Thought Content: Thought content normal.        Judgment: Judgment normal.      Results for orders placed or performed in visit on 09/11/23  TSH  Result Value Ref Range   TSH 2.160 0.450 - 4.500 uIU/mL  CBC with Differential/Platelet  Result Value Ref Range   WBC 7.8 3.4 - 10.8 x10E3/uL   RBC 4.68 3.77 - 5.28 x10E6/uL    Hemoglobin 14.1 11.1 - 15.9 g/dL   Hematocrit 09.3 23.5 - 46.6 %   MCV 93 79 - 97 fL   MCH 30.1 26.6 - 33.0 pg   MCHC 32.4 31.5 - 35.7 g/dL   RDW 57.3 22.0 - 25.4 %   Platelets 168 150 - 450 x10E3/uL   Neutrophils 63 Not Estab. %   Lymphs 28 Not Estab. %   Monocytes 7 Not Estab. %  Eos 2 Not Estab. %   Basos 0 Not Estab. %   Neutrophils Absolute 4.9 1.4 - 7.0 x10E3/uL   Lymphocytes Absolute 2.2 0.7 - 3.1 x10E3/uL   Monocytes Absolute 0.6 0.1 - 0.9 x10E3/uL   EOS (ABSOLUTE) 0.2 0.0 - 0.4 x10E3/uL   Basophils Absolute 0.0 0.0 - 0.2 x10E3/uL   Immature Granulocytes 0 Not Estab. %   Immature Grans (Abs) 0.0 0.0 - 0.1 x10E3/uL  Hemoglobin A1c  Result Value Ref Range   Hgb A1c MFr Bld 7.0 (H) 4.8 - 5.6 %   Est. average glucose Bld gHb Est-mCnc 154 mg/dL  Lipid panel  Result Value Ref Range   Cholesterol, Total 136 100 - 199 mg/dL   Triglycerides 621 0 - 149 mg/dL   HDL 40 >30 mg/dL   VLDL Cholesterol Cal 23 5 - 40 mg/dL   LDL Chol Calc (NIH) 73 0 - 99 mg/dL   Chol/HDL Ratio 3.4 0.0 - 4.4 ratio  CMP14+EGFR  Result Value Ref Range   Glucose 106 (H) 70 - 99 mg/dL   BUN 12 8 - 27 mg/dL   Creatinine, Ser 8.65 0.57 - 1.00 mg/dL   eGFR 91 >78 IO/NGE/9.52   BUN/Creatinine Ratio 16 12 - 28   Sodium 141 134 - 144 mmol/L   Potassium 4.1 3.5 - 5.2 mmol/L   Chloride 100 96 - 106 mmol/L   CO2 21 20 - 29 mmol/L   Calcium 9.3 8.7 - 10.3 mg/dL   Total Protein 7.1 6.0 - 8.5 g/dL   Albumin 4.3 3.8 - 4.9 g/dL   Globulin, Total 2.8 1.5 - 4.5 g/dL   Bilirubin Total 0.6 0.0 - 1.2 mg/dL   Alkaline Phosphatase 103 44 - 121 IU/L   AST 34 0 - 40 IU/L   ALT 25 0 - 32 IU/L  POCT CBG (Fasting - Glucose)  Result Value Ref Range   Glucose Fasting, POC 115 (A) 70 - 99 mg/dL  POC CREATINE & ALBUMIN,URINE  Result Value Ref Range   Microalbumin Ur, POC 30 mg/L   Creatinine, POC 200 mg/dL   Albumin/Creatinine Ratio, Urine, POC <30     Recent Results (from the past 2160 hour(s))  POCT CBG (Fasting  - Glucose)     Status: Abnormal   Collection Time: 09/11/23 11:34 AM  Result Value Ref Range   Glucose Fasting, POC 115 (A) 70 - 99 mg/dL  POC CREATINE & ALBUMIN,URINE     Status: None   Collection Time: 09/11/23 12:06 PM  Result Value Ref Range   Microalbumin Ur, POC 30 mg/L   Creatinine, POC 200 mg/dL   Albumin/Creatinine Ratio, Urine, POC <30   TSH     Status: None   Collection Time: 09/11/23 12:13 PM  Result Value Ref Range   TSH 2.160 0.450 - 4.500 uIU/mL  CBC with Differential/Platelet     Status: None   Collection Time: 09/11/23 12:13 PM  Result Value Ref Range   WBC 7.8 3.4 - 10.8 x10E3/uL   RBC 4.68 3.77 - 5.28 x10E6/uL   Hemoglobin 14.1 11.1 - 15.9 g/dL   Hematocrit 84.1 32.4 - 46.6 %   MCV 93 79 - 97 fL   MCH 30.1 26.6 - 33.0 pg   MCHC 32.4 31.5 - 35.7 g/dL   RDW 40.1 02.7 - 25.3 %   Platelets 168 150 - 450 x10E3/uL   Neutrophils 63 Not Estab. %   Lymphs 28 Not Estab. %   Monocytes 7 Not Estab. %  Eos 2 Not Estab. %   Basos 0 Not Estab. %   Neutrophils Absolute 4.9 1.4 - 7.0 x10E3/uL   Lymphocytes Absolute 2.2 0.7 - 3.1 x10E3/uL   Monocytes Absolute 0.6 0.1 - 0.9 x10E3/uL   EOS (ABSOLUTE) 0.2 0.0 - 0.4 x10E3/uL   Basophils Absolute 0.0 0.0 - 0.2 x10E3/uL   Immature Granulocytes 0 Not Estab. %   Immature Grans (Abs) 0.0 0.0 - 0.1 x10E3/uL  Hemoglobin A1c     Status: Abnormal   Collection Time: 09/11/23 12:13 PM  Result Value Ref Range   Hgb A1c MFr Bld 7.0 (H) 4.8 - 5.6 %    Comment:          Prediabetes: 5.7 - 6.4          Diabetes: >6.4          Glycemic control for adults with diabetes: <7.0    Est. average glucose Bld gHb Est-mCnc 154 mg/dL  Lipid panel     Status: None   Collection Time: 09/11/23 12:13 PM  Result Value Ref Range   Cholesterol, Total 136 100 - 199 mg/dL   Triglycerides 604 0 - 149 mg/dL   HDL 40 >54 mg/dL   VLDL Cholesterol Cal 23 5 - 40 mg/dL   LDL Chol Calc (NIH) 73 0 - 99 mg/dL   Chol/HDL Ratio 3.4 0.0 - 4.4 ratio    Comment:                                    T. Chol/HDL Ratio                                             Men  Women                               1/2 Avg.Risk  3.4    3.3                                   Avg.Risk  5.0    4.4                                2X Avg.Risk  9.6    7.1                                3X Avg.Risk 23.4   11.0   CMP14+EGFR     Status: Abnormal   Collection Time: 09/11/23 12:13 PM  Result Value Ref Range   Glucose 106 (H) 70 - 99 mg/dL   BUN 12 8 - 27 mg/dL   Creatinine, Ser 0.98 0.57 - 1.00 mg/dL   eGFR 91 >11 BJ/YNW/2.95   BUN/Creatinine Ratio 16 12 - 28   Sodium 141 134 - 144 mmol/L   Potassium 4.1 3.5 - 5.2 mmol/L   Chloride 100 96 - 106 mmol/L   CO2 21 20 - 29 mmol/L   Calcium 9.3 8.7 - 10.3 mg/dL   Total Protein 7.1 6.0 - 8.5 g/dL   Albumin 4.3 3.8 - 4.9 g/dL  Globulin, Total 2.8 1.5 - 4.5 g/dL   Bilirubin Total 0.6 0.0 - 1.2 mg/dL   Alkaline Phosphatase 103 44 - 121 IU/L   AST 34 0 - 40 IU/L   ALT 25 0 - 32 IU/L       Assessment & Plan:   Problem List Items Addressed This Visit       Active Problems   Moderate COPD (chronic obstructive pulmonary disease) (HCC)    Setting up referral to pulmonology for pt.  She will await call from them.   Deferring to them for further treatment      Relevant Orders   Ambulatory referral to Pulmonology   Type 2 diabetes mellitus with hyperglycemia (HCC) - Primary    Checking labs today. Will call pt. With results  Continue current diabetes POC, as patient has been well controlled on current regimen.  Will adjust meds if needed based on labs.       Essential hypertension    Blood pressure well controlled with current medications.  Continue current therapy.  Will reassess at follow up.       Mixed hyperlipidemia   Moderate persistent asthma, uncomplicated    Setting up referral to pulmonology for pt.  She will await call from them.   Deferring to them for further treatment      Relevant Orders    Ambulatory referral to Pulmonology   Other Visit Diagnoses     Vitamin D deficiency, unspecified       Checking labs today.  Will continue supplements as needed.   Need for hepatitis C screening test       Test ordered today. Will call with results.   Screening for HIV without presence of risk factors       Test ordered today. Will call with results.   B12 deficiency due to diet       Checking labs today.  Will continue supplements as needed.       Return in about 2 weeks (around 09/25/2023).   Total time spent: 30 minutes  Miki Kins, FNP  09/11/2023   This document may have been prepared by [facility] Voice Recognition software and as such may include unintentional dictation errors.

## 2023-09-12 LAB — LIPID PANEL
Chol/HDL Ratio: 3.4 ratio (ref 0.0–4.4)
Cholesterol, Total: 136 mg/dL (ref 100–199)
HDL: 40 mg/dL (ref >39)
LDL Chol Calc (NIH): 73 mg/dL (ref 0–99)
Triglycerides: 130 mg/dL (ref 0–149)
VLDL Cholesterol Cal: 23 mg/dL (ref 5–40)

## 2023-09-12 LAB — CBC WITH DIFFERENTIAL/PLATELET
Basophils Absolute: 0 10*3/uL (ref 0.0–0.2)
Basos: 0 %
EOS (ABSOLUTE): 0.2 10*3/uL (ref 0.0–0.4)
Eos: 2 %
Hematocrit: 43.5 % (ref 34.0–46.6)
Hemoglobin: 14.1 g/dL (ref 11.1–15.9)
Immature Grans (Abs): 0 10*3/uL (ref 0.0–0.1)
Immature Granulocytes: 0 %
Lymphocytes Absolute: 2.2 10*3/uL (ref 0.7–3.1)
Lymphs: 28 %
MCH: 30.1 pg (ref 26.6–33.0)
MCHC: 32.4 g/dL (ref 31.5–35.7)
MCV: 93 fL (ref 79–97)
Monocytes Absolute: 0.6 10*3/uL (ref 0.1–0.9)
Monocytes: 7 %
Neutrophils Absolute: 4.9 10*3/uL (ref 1.4–7.0)
Neutrophils: 63 %
Platelets: 168 10*3/uL (ref 150–450)
RBC: 4.68 x10E6/uL (ref 3.77–5.28)
RDW: 12.8 % (ref 11.7–15.4)
WBC: 7.8 10*3/uL (ref 3.4–10.8)

## 2023-09-12 LAB — CMP14+EGFR
ALT: 25 [IU]/L (ref 0–32)
AST: 34 [IU]/L (ref 0–40)
Albumin: 4.3 g/dL (ref 3.8–4.9)
Alkaline Phosphatase: 103 [IU]/L (ref 44–121)
BUN/Creatinine Ratio: 16 (ref 12–28)
BUN: 12 mg/dL (ref 8–27)
Bilirubin Total: 0.6 mg/dL (ref 0.0–1.2)
CO2: 21 mmol/L (ref 20–29)
Calcium: 9.3 mg/dL (ref 8.7–10.3)
Chloride: 100 mmol/L (ref 96–106)
Creatinine, Ser: 0.75 mg/dL (ref 0.57–1.00)
Globulin, Total: 2.8 g/dL (ref 1.5–4.5)
Glucose: 106 mg/dL — ABNORMAL HIGH (ref 70–99)
Potassium: 4.1 mmol/L (ref 3.5–5.2)
Sodium: 141 mmol/L (ref 134–144)
Total Protein: 7.1 g/dL (ref 6.0–8.5)
eGFR: 91 mL/min/{1.73_m2} (ref >59)

## 2023-09-12 LAB — HEMOGLOBIN A1C
Est. average glucose Bld gHb Est-mCnc: 154 mg/dL
Hgb A1c MFr Bld: 7 % — ABNORMAL HIGH (ref 4.8–5.6)

## 2023-09-12 LAB — TSH: TSH: 2.16 u[IU]/mL (ref 0.450–4.500)

## 2023-09-16 ENCOUNTER — Other Ambulatory Visit: Payer: Self-pay | Admitting: Family

## 2023-09-18 MED ORDER — VARENICLINE TARTRATE (STARTER) 0.5 MG X 11 & 1 MG X 42 PO TBPK: ORAL_TABLET | ORAL | 0 refills | Status: DC

## 2023-09-19 ENCOUNTER — Telehealth: Payer: Self-pay

## 2023-09-25 ENCOUNTER — Ambulatory Visit: Payer: No Typology Code available for payment source | Admitting: Family

## 2023-09-25 ENCOUNTER — Encounter: Payer: Self-pay | Admitting: Family

## 2023-09-25 VITALS — BP 108/68 | HR 105 | Ht 59.0 in | Wt 193.0 lb

## 2023-09-25 DIAGNOSIS — E1165 Type 2 diabetes mellitus with hyperglycemia: Secondary | ICD-10-CM | POA: Diagnosis not present

## 2023-09-25 DIAGNOSIS — Z794 Long term (current) use of insulin: Secondary | ICD-10-CM

## 2023-09-25 DIAGNOSIS — J449 Chronic obstructive pulmonary disease, unspecified: Secondary | ICD-10-CM

## 2023-09-25 DIAGNOSIS — G894 Chronic pain syndrome: Secondary | ICD-10-CM

## 2023-09-25 DIAGNOSIS — F331 Major depressive disorder, recurrent, moderate: Secondary | ICD-10-CM

## 2023-09-25 DIAGNOSIS — Z23 Encounter for immunization: Secondary | ICD-10-CM | POA: Diagnosis not present

## 2023-09-25 DIAGNOSIS — Z0130 Encounter for examination of blood pressure without abnormal findings: Secondary | ICD-10-CM

## 2023-09-25 MED ORDER — ACCU-CHEK SOFTCLIX LANCETS MISC: 200.0000 | Freq: Four times a day (QID) | 1 refills | Status: DC

## 2023-09-25 MED ORDER — OXYCODONE-ACETAMINOPHEN 5-325 MG PO TABS: 1.0000 | ORAL_TABLET | Freq: Four times a day (QID) | ORAL | 0 refills | Status: DC | PRN

## 2023-09-25 MED ORDER — ACCU-CHEK GUIDE ME W/DEVICE KIT: PACK | 0 refills | Status: AC

## 2023-09-25 MED ORDER — ACCU-CHEK GUIDE VI STRP: ORAL_STRIP | 12 refills | Status: AC

## 2023-09-25 NOTE — Progress Notes (Unsigned)
Pain Clinic - Call Dr. Welton Flakes, see if he takes insurance.   Psych  Pulmonary

## 2023-10-01 ENCOUNTER — Encounter: Payer: Self-pay | Admitting: Family

## 2023-10-01 NOTE — Assessment & Plan Note (Signed)
Checking labs today. Will call pt. With results  Continue current diabetes POC, as patient has been well controlled on current regimen.  Will adjust meds if needed based on labs.  

## 2023-10-01 NOTE — Assessment & Plan Note (Signed)
Setting up referral to pulmonology for pt.  She will await call from them.   Deferring to them for further treatment

## 2023-10-01 NOTE — Assessment & Plan Note (Signed)
Blood pressure well controlled with current medications.  Continue current therapy.  Will reassess at follow up.  

## 2023-10-01 NOTE — Assessment & Plan Note (Signed)
Pt has chronic pain, will set up for referral to pain clinic.  Deferring to them for further changes.

## 2023-10-03 ENCOUNTER — Other Ambulatory Visit: Payer: Self-pay | Admitting: *Deleted

## 2023-10-06 MED ORDER — OXYCODONE-ACETAMINOPHEN 5-325 MG PO TABS: 1.0000 | ORAL_TABLET | Freq: Four times a day (QID) | ORAL | 0 refills | Status: AC | PRN

## 2023-10-17 ENCOUNTER — Telehealth: Payer: Self-pay

## 2023-10-17 NOTE — Telephone Encounter (Signed)
Patient wants RX for [place] 10 mg sent to [facility]

## 2023-10-24 ENCOUNTER — Other Ambulatory Visit: Payer: Self-pay | Admitting: Family

## 2023-10-24 DIAGNOSIS — E1165 Type 2 diabetes mellitus with hyperglycemia: Secondary | ICD-10-CM

## 2023-10-25 ENCOUNTER — Ambulatory Visit: Payer: No Typology Code available for payment source | Admitting: Family

## 2023-10-26 NOTE — Addendum Note (Signed)
Addended byKatherine Mantle on: 10/26/2023 10:44 AM   Modules accepted: Orders

## 2023-11-10 ENCOUNTER — Ambulatory Visit
Admission: RE | Admit: 2023-11-10 | Discharge: 2023-11-10 | Disposition: A | Discharge status: Home / Self Care | Payer: No Typology Code available for payment source | Attending: Family | Admitting: Family

## 2023-11-10 ENCOUNTER — Ambulatory Visit: Payer: No Typology Code available for payment source | Admitting: Family

## 2023-11-10 ENCOUNTER — Ambulatory Visit
Admission: RE | Admit: 2023-11-10 | Discharge: 2023-11-10 | Disposition: A | Discharge status: Home / Self Care | Payer: No Typology Code available for payment source | Source: Ambulatory Visit | Attending: Family | Admitting: Family

## 2023-11-10 VITALS — BP 107/82 | HR 96 | Wt 190.0 lb

## 2023-11-10 DIAGNOSIS — G894 Chronic pain syndrome: Secondary | ICD-10-CM

## 2023-11-10 DIAGNOSIS — R1032 Left lower quadrant pain: Secondary | ICD-10-CM | POA: Diagnosis not present

## 2023-11-10 DIAGNOSIS — R1084 Generalized abdominal pain: Secondary | ICD-10-CM | POA: Diagnosis not present

## 2023-11-10 DIAGNOSIS — E1165 Type 2 diabetes mellitus with hyperglycemia: Secondary | ICD-10-CM | POA: Diagnosis not present

## 2023-11-10 DIAGNOSIS — Z860100 Personal history of colon polyps, unspecified: Secondary | ICD-10-CM | POA: Diagnosis not present

## 2023-11-10 DIAGNOSIS — Z0130 Encounter for examination of blood pressure without abnormal findings: Secondary | ICD-10-CM

## 2023-11-10 DIAGNOSIS — G8929 Other chronic pain: Secondary | ICD-10-CM

## 2023-11-10 LAB — POCT CBG (FASTING - GLUCOSE)-MANUAL ENTRY: Glucose Fasting, POC: 174 mg/dL — AB (ref 70–99)

## 2023-11-10 MED ORDER — TRELEGY ELLIPTA 100-62.5-25 MCG/ACT IN AEPB: 1.0000 | INHALATION_SPRAY | Freq: Every day | RESPIRATORY_TRACT | 1 refills | Status: DC

## 2023-11-10 MED ORDER — OXYCODONE-ACETAMINOPHEN 5-325 MG PO TABS: 1.0000 | ORAL_TABLET | Freq: Four times a day (QID) | ORAL | 0 refills | Status: AC | PRN

## 2023-11-10 MED ORDER — ONETOUCH ULTRASOFT LANCETS MISC: 2 refills | Status: AC

## 2023-11-10 NOTE — Patient Instructions (Signed)
Pain Clinic:  [phone]

## 2023-11-10 NOTE — Progress Notes (Signed)
 Established Patient Office Visit  Subjective:  Patient ID: Kelly Dillon, female    DOB: 08-18-1961  Age: 62 y.o. MRN: [id number]  Chief Complaint  Patient presents with   Pain    Still in pain hasn't heard anything from referrals to pain mngt Yesterday when trying to have a bowel movement she felt a pain in her lower abd    Patient here with pain in her lower abdomen.  She also has not heard from the pain clinic re: her referral yet.   She says that she was having a BM yesterday and felt a sharp pain in her LLQ abdomen.  She does not have any other concerning symptoms at this time.     No other concerns at this time.   Past Medical History:  Diagnosis Date   Acute on chronic respiratory failure with hypoxia (HCC) 08/27/2021   Chronic pain syndrome    extensive - see problem list   Community acquired pneumonia of right lower lobe of lung 08/27/2021   COPD (chronic obstructive pulmonary disease) (HCC)    Diabetes mellitus without complication (HCC)    Fibromyalgia    went to [facility] for monthly lidocaine infusions   Hypertension    Sepsis (HCC) 08/26/2021    Past Surgical History:  Procedure Laterality Date   APPENDECTOMY     COLONOSCOPY     COLONOSCOPY WITH PROPOFOL N/A 03/01/2022   Procedure: COLONOSCOPY WITH PROPOFOL;  Surgeon: Wyline Mood, MD;  Location: [facility] ENDOSCOPY;  Service: Gastroenterology;  Laterality: N/A;   ESOPHAGOGASTRODUODENOSCOPY     ESOPHAGOGASTRODUODENOSCOPY N/A 03/01/2022   Procedure: ESOPHAGOGASTRODUODENOSCOPY (EGD);  Surgeon: Wyline Mood, MD;  Location: [facility] ENDOSCOPY;  Service: Gastroenterology;  Laterality: N/A;   HERNIA REPAIR     TUBAL LIGATION     VENTRAL HERNIA REPAIR N/A 08/15/2018   Procedure: HERNIA REPAIR VENTRAL ADULT;  Surgeon: Sung Amabile, DO;  Location: [facility] ORS;  Service: General;  Laterality: N/A;   VENTRAL HERNIA REPAIR N/A 08/18/2018   Procedure: LAPAROSCOPIC VENTRAL HERNIA;  Surgeon: Sung Amabile, DO;  Location:  [facility] ORS;  Service: General;  Laterality: N/A;    Social History   Socioeconomic History   Marital status: Divorced    Spouse name: Not on file   Number of children: Not on file   Years of education: Not on file   Highest education level: Not on file  Occupational History   Not on file  Tobacco Use   Smoking status: Former    Types: Cigarettes   Smokeless tobacco: Never   Tobacco comments:    SMOKES 2-3 CIGARETTES QD  Vaping Use   Vaping status: Never Used  Substance and Sexual Activity   Alcohol use: No   Drug use: Yes    Comment: CHRONIC PAIN ,ON PERCOCET   Sexual activity: Not Currently  Other Topics Concern   Not on file  Social History Narrative   Not on file   Social Drivers of Health   Financial Resource Strain: Medium Risk (11/22/2022)   Received from [facility], [facility]   Overall Financial Resource Strain (CARDIA)    Difficulty of Paying Living Expenses: Somewhat hard  Food Insecurity: Food Insecurity Present (11/30/2023)   Hunger Vital Sign    Worried About Running Out of Food in the Last Year: Sometimes true    Ran Out of Food in the Last Year: Sometimes true  Transportation Needs: No Transportation Needs (11/30/2023)   PRAPARE - Transportation  Lack of Transportation (Medical): No    Lack of Transportation (Non-Medical): No  Physical Activity: Insufficiently Active (08/21/2019)   Received from [facility], [facility]   Exercise Vital Sign    Days of Exercise per Week: 4 days    Minutes of Exercise per Session: 30 min  Stress: Stress Concern Present (08/21/2019)   Received from [facility], [facility]   [employer] of Occupational Health - Occupational Stress Questionnaire    Feeling of Stress : Very much  Social Connections: Unknown (08/21/2019)   Received from [facility], [facility]   Social  Connection and Isolation Panel [NHANES]    Frequency of Communication with Friends and Family: Once a week    Frequency of Social Gatherings with Friends and Family: Once a week    Attends Religious Services: More than 4 times per year    Active Member of [employer] or Organizations: Not on file    Attends Banker Meetings: Not on file    Marital Status: Not on file  Intimate Partner Violence: Not At Risk (11/30/2023)   Humiliation, Afraid, Rape, and Kick questionnaire    Fear of Current or Ex-Partner: No    Emotionally Abused: No    Physically Abused: No    Sexually Abused: No    No family history on file.  No Known Allergies  Review of Systems  Gastrointestinal:  Positive for abdominal pain. Negative for blood in stool, constipation, diarrhea and vomiting.  Musculoskeletal:  Positive for back pain, joint pain and myalgias.  All other systems reviewed and are negative.      Objective:   BP 107/82   Pulse 96   Wt 190 lb (86.2 kg)   SpO2 98%   BMI 38.38 kg/m   Vitals:   11/10/23 1258  BP: 107/82  Pulse: 96  Weight: 190 lb (86.2 kg)  SpO2: 98%    Physical Exam Vitals and nursing note reviewed.  Constitutional:      General: She is not in acute distress.    Appearance: Normal appearance. She is obese. She is ill-appearing. She is not toxic-appearing.  HENT:     Head: Normocephalic and atraumatic.  Eyes:     Extraocular Movements: Extraocular movements intact.     Conjunctiva/sclera: Conjunctivae normal.     Pupils: Pupils are equal, round, and reactive to light.  Cardiovascular:     Rate and Rhythm: Normal rate.  Pulmonary:     Effort: Pulmonary effort is normal.  Abdominal:     General: Bowel sounds are normal.     Palpations: There is mass.     Tenderness: There is abdominal tenderness (LLQ) in the left upper quadrant.  Musculoskeletal:        General: Tenderness present.     Cervical back: Tenderness present.     Thoracic back: Tenderness  present. Decreased range of motion.     Lumbar back: Tenderness present. Decreased range of motion. Positive right straight leg raise test and positive left straight leg raise test.  Neurological:     General: No focal deficit present.     Mental Status: She is alert and oriented to person, place, and time. Mental status is at baseline.  Psychiatric:        Attention and Perception: Attention and perception normal.        Mood and Affect: Affect normal. Mood is depressed.  Speech: Speech normal.        Behavior: Behavior normal. Behavior is cooperative.        Thought Content: Thought content normal.        Cognition and Memory: Cognition and memory normal.        Judgment: Judgment normal.      Results for orders placed or performed in visit on 11/10/23  POCT CBG (Fasting - Glucose)  Result Value Ref Range   Glucose Fasting, POC 174 (A) 70 - 99 mg/dL    Recent Results (from the past 2160 hours)  CBC with Differential     Status: None   Collection Time: 11/28/23  3:42 PM  Result Value Ref Range   WBC 7.7 4.0 - 10.5 K/uL   RBC 4.28 3.87 - 5.11 MIL/uL   Hemoglobin 12.5 12.0 - 15.0 g/dL   HCT 82.9 56.2 - 13.0 %   MCV 87.4 80.0 - 100.0 fL   MCH 29.2 26.0 - 34.0 pg   MCHC 33.4 30.0 - 36.0 g/dL   RDW 86.5 78.4 - 69.6 %   Platelets 164 150 - 400 K/uL   nRBC 0.0 0.0 - 0.2 %   Neutrophils Relative % 62 %   Neutro Abs 4.8 1.7 - 7.7 K/uL   Lymphocytes Relative 30 %   Lymphs Abs 2.3 0.7 - 4.0 K/uL   Monocytes Relative 5 %   Monocytes Absolute 0.4 0.1 - 1.0 K/uL   Eosinophils Relative 2 %   Eosinophils Absolute 0.1 0.0 - 0.5 K/uL   Basophils Relative 1 %   Basophils Absolute 0.0 0.0 - 0.1 K/uL   Immature Granulocytes 0 %   Abs Immature Granulocytes 0.02 0.00 - 0.07 K/uL    Comment: Performed at [facility], [address] [place], [place] [address]  Comprehensive metabolic panel     Status: Abnormal   Collection Time: 11/28/23  3:42 PM  Result Value Ref Range    Sodium 137 135 - 145 mmol/L   Potassium 3.6 3.5 - 5.1 mmol/L   Chloride 102 98 - 111 mmol/L   CO2 24 22 - 32 mmol/L   Glucose, Bld 110 (H) 70 - 99 mg/dL    Comment: Glucose reference range applies only to samples taken after fasting for at least 8 hours.   BUN 11 6 - 20 mg/dL   Creatinine, Ser 4.13 0.44 - 1.00 mg/dL   Calcium 8.7 (L) 8.9 - 10.3 mg/dL   Total Protein 7.7 6.5 - 8.1 g/dL   Albumin 3.9 3.5 - 5.0 g/dL   AST 30 15 - 41 U/L   ALT 22 0 - 44 U/L   Alkaline Phosphatase 101 38 - 126 U/L   Total Bilirubin 0.7 0.0 - 1.2 mg/dL   GFR, Estimated >24 >40 mL/min    Comment: (NOTE) Calculated using the CKD-EPI Creatinine Equation (2021)    Anion gap 11 5 - 15    Comment: Performed at [facility], [address], [place], [place] [address]  Resp panel by RT-PCR (RSV, Flu A&B, Covid) Anterior Nasal Swab     Status: None   Collection Time: 11/28/23  3:42 PM   Specimen: Anterior Nasal Swab  Result Value Ref Range   SARS Coronavirus 2 by RT PCR NEGATIVE NEGATIVE    Comment: (NOTE) SARS-CoV-2 target nucleic acids are NOT DETECTED.  The SARS-CoV-2 RNA is generally detectable in upper respiratory specimens during the acute phase of infection. The lowest concentration of SARS-CoV-2 viral copies this  assay can detect is 138 copies/mL. A negative result does not preclude SARS-Cov-2 infection and should not be used as the sole basis for treatment or other patient management decisions. A negative result may occur with  improper specimen collection/handling, submission of specimen other than nasopharyngeal swab, presence of viral mutation(s) within the areas targeted by this assay, and inadequate number of viral copies(<138 copies/mL). A negative result must be combined with clinical observations, patient history, and epidemiological information. The expected result is Negative.  Fact Sheet for Patients:  BloggerCourse.com  Fact Sheet for Healthcare  Providers:  SeriousBroker.it  This test is no t yet approved or cleared by the [place] FDA and  has been authorized for detection and/or diagnosis of SARS-CoV-2 by FDA under an Emergency Use Authorization (EUA). This EUA will remain  in effect (meaning this test can be used) for the duration of the COVID-19 declaration under Section 564(b)(1) of the Act, 21 U.S.C.section 360bbb-3(b)(1), unless the authorization is terminated  or revoked sooner.       Influenza A by PCR NEGATIVE NEGATIVE   Influenza B by PCR NEGATIVE NEGATIVE    Comment: (NOTE) The Xpert Xpress SARS-CoV-2/FLU/RSV plus assay is intended as an aid in the diagnosis of influenza from Nasopharyngeal swab specimens and should not be used as a sole basis for treatment. Nasal washings and aspirates are unacceptable for Xpert Xpress SARS-CoV-2/FLU/RSV testing.  Fact Sheet for Patients: BloggerCourse.com  Fact Sheet for Healthcare Providers: SeriousBroker.it  This test is not yet approved or cleared by the [place] FDA and has been authorized for detection and/or diagnosis of SARS-CoV-2 by FDA under an Emergency Use Authorization (EUA). This EUA will remain in effect (meaning this test can be used) for the duration of the COVID-19 declaration under Section 564(b)(1) of the Act, 21 U.S.C. section 360bbb-3(b)(1), unless the authorization is terminated or revoked.     Resp Syncytial Virus by PCR NEGATIVE NEGATIVE    Comment: (NOTE) Fact Sheet for Patients: BloggerCourse.com  Fact Sheet for Healthcare Providers: SeriousBroker.it  This test is not yet approved or cleared by the [place] FDA and has been authorized for detection and/or diagnosis of SARS-CoV-2 by FDA under an Emergency Use Authorization (EUA). This EUA will remain in effect (meaning this test can be used) for the  duration of the COVID-19 declaration under Section 564(b)(1) of the Act, 21 U.S.C. section 360bbb-3(b)(1), unless the authorization is terminated or revoked.  Performed at [facility], [address] [place], [place] [address]   Lactic acid, plasma     Status: None   Collection Time: 11/28/23  9:31 PM  Result Value Ref Range   Lactic Acid, Venous 0.9 0.5 - 1.9 mmol/L    Comment: Performed at [facility], [address] [place], [place] [address]  Blood culture (routine x 2)     Status: None   Collection Time: 11/28/23  9:31 PM   Specimen: BLOOD  Result Value Ref Range   Specimen Description BLOOD LEFT ARM    Special Requests      BOTTLES DRAWN AEROBIC AND ANAEROBIC Blood Culture results may not be optimal due to an inadequate volume of blood received in culture bottles   Culture      NO GROWTH 5 DAYS Performed at [facility], [address], [place], [place] [address]    Report Status 12/03/2023 FINAL   Blood culture (routine x 2)     Status: None   Collection Time: 11/28/23  9:31  PM   Specimen: BLOOD  Result Value Ref Range   Specimen Description BLOOD RIGHT ARM    Special Requests      BOTTLES DRAWN AEROBIC AND ANAEROBIC Blood Culture results may not be optimal due to an inadequate volume of blood received in culture bottles   Culture      NO GROWTH 5 DAYS Performed at [facility], [address] [place], [place] [address]    Report Status 12/03/2023 FINAL   Procalcitonin     Status: None   Collection Time: 11/28/23  9:31 PM  Result Value Ref Range   Procalcitonin <0.10 ng/mL    Comment:        Interpretation: PCT (Procalcitonin) <= 0.5 ng/mL: Systemic infection (sepsis) is not likely. Local bacterial infection is possible. (NOTE)       Sepsis PCT Algorithm           Lower Respiratory Tract                                      Infection PCT Algorithm    ----------------------------     ----------------------------          PCT < 0.25 ng/mL                PCT < 0.10 ng/mL          Strongly encourage             Strongly discourage   discontinuation of antibiotics    initiation of antibiotics    ----------------------------     -----------------------------       PCT 0.25 - 0.50 ng/mL            PCT 0.10 - 0.25 ng/mL               OR       >80% decrease in PCT            Discourage initiation of                                            antibiotics      Encourage discontinuation           of antibiotics    ----------------------------     -----------------------------         PCT >= 0.50 ng/mL              PCT 0.26 - 0.50 ng/mL               AND        <80% decrease in PCT             Encourage initiation of                                             antibiotics       Encourage continuation           of antibiotics    ----------------------------     -----------------------------        PCT >= 0.50 ng/mL                  PCT > 0.50 ng/mL  AND         increase in PCT                  Strongly encourage                                      initiation of antibiotics    Strongly encourage escalation           of antibiotics                                     -----------------------------                                           PCT <= 0.25 ng/mL                                                 OR                                        > 80% decrease in PCT                                      Discontinue / Do not initiate                                             antibiotics  Performed at [facility], [address] [place], [place] [address]   Lactic acid, plasma     Status: None   Collection Time: 11/28/23 11:18 PM  Result Value Ref Range   Lactic Acid, Venous 1.7 0.5 - 1.9 mmol/L    Comment: Performed at [facility], [address] [place], [place] [address]  CBG monitoring, ED     Status: Abnormal   Collection Time: 11/29/23  4:47 AM  Result Value  Ref Range   Glucose-Capillary 369 (H) 70 - 99 mg/dL    Comment: Glucose reference range applies only to samples taken after fasting for at least 8 hours.  CBG monitoring, ED     Status: Abnormal   Collection Time: 11/29/23  7:37 AM  Result Value Ref Range   Glucose-Capillary 253 (H) 70 - 99 mg/dL    Comment: Glucose reference range applies only to samples taken after fasting for at least 8 hours.  CBG monitoring, ED     Status: Abnormal   Collection Time: 11/29/23 11:34 AM  Result Value Ref Range   Glucose-Capillary 204 (H) 70 - 99 mg/dL    Comment: Glucose reference range applies only to samples taken after fasting for at least 8 hours.  CBG monitoring, ED     Status: Abnormal   Collection Time: 11/29/23  4:18 PM  Result Value Ref Range   Glucose-Capillary 124 (H) 70 - 99 mg/dL  Comment: Glucose reference range applies only to samples taken after fasting for at least 8 hours.  CBG monitoring, ED     Status: Abnormal   Collection Time: 11/29/23  8:44 PM  Result Value Ref Range   Glucose-Capillary 171 (H) 70 - 99 mg/dL    Comment: Glucose reference range applies only to samples taken after fasting for at least 8 hours.  Respiratory (~20 pathogens) panel by PCR     Status: None   Collection Time: 11/29/23  9:36 PM   Specimen: Nasopharyngeal Swab; Respiratory  Result Value Ref Range   Adenovirus NOT DETECTED NOT DETECTED   Coronavirus 229E NOT DETECTED NOT DETECTED    Comment: (NOTE) The Coronavirus on the Respiratory Panel, DOES NOT test for the novel  Coronavirus (2019 nCoV)    Coronavirus HKU1 NOT DETECTED NOT DETECTED   Coronavirus NL63 NOT DETECTED NOT DETECTED   Coronavirus OC43 NOT DETECTED NOT DETECTED   Metapneumovirus NOT DETECTED NOT DETECTED   Rhinovirus / Enterovirus NOT DETECTED NOT DETECTED   Influenza A NOT DETECTED NOT DETECTED   Influenza B NOT DETECTED NOT DETECTED   Parainfluenza Virus 1 NOT DETECTED NOT DETECTED   Parainfluenza Virus 2 NOT DETECTED NOT  DETECTED   Parainfluenza Virus 3 NOT DETECTED NOT DETECTED   Parainfluenza Virus 4 NOT DETECTED NOT DETECTED   Respiratory Syncytial Virus NOT DETECTED NOT DETECTED   Bordetella pertussis NOT DETECTED NOT DETECTED   Bordetella Parapertussis NOT DETECTED NOT DETECTED   Chlamydophila pneumoniae NOT DETECTED NOT DETECTED   Mycoplasma pneumoniae NOT DETECTED NOT DETECTED    Comment: Performed at [facility], [address], [place], [place] [address]  HIV Antibody (routine testing w rflx)     Status: None   Collection Time: 11/29/23  9:36 PM  Result Value Ref Range   HIV Screen 4th Generation wRfx Non Reactive Non Reactive    Comment: Performed at [facility], [address] [place], [place] [address]  CBG monitoring, ED     Status: Abnormal   Collection Time: 11/30/23 12:09 AM  Result Value Ref Range   Glucose-Capillary 173 (H) 70 - 99 mg/dL    Comment: Glucose reference range applies only to samples taken after fasting for at least 8 hours.  CBG monitoring, ED     Status: Abnormal   Collection Time: 11/30/23  4:06 AM  Result Value Ref Range   Glucose-Capillary 189 (H) 70 - 99 mg/dL    Comment: Glucose reference range applies only to samples taken after fasting for at least 8 hours.  CBG monitoring, ED     Status: Abnormal   Collection Time: 11/30/23  7:33 AM  Result Value Ref Range   Glucose-Capillary 271 (H) 70 - 99 mg/dL    Comment: Glucose reference range applies only to samples taken after fasting for at least 8 hours.  Legionella Pneumophila Serogp 1 Ur Ag     Status: None   Collection Time: 11/30/23  7:50 AM  Result Value Ref Range   L. pneumophila Serogp 1 Ur Ag Negative Negative    Comment: (NOTE) Presumptive negative for L. pneumophila serogroup 1 antigen in urine, suggesting no recent or current infection. Legionnaires' disease cannot be ruled out since other serogroups and species may also cause disease. Performed At: [facility] [address] [place], [place] [id number] Jolene Schimke MD GM:[id number]    Source of Sample URINE, RANDOM     Comment: Performed at [facility], [phone]  [address] [place], [place] [address]  Strep pneumoniae urinary antigen     Status: None   Collection Time: 11/30/23  7:50 AM  Result Value Ref Range   Strep Pneumo Urinary Antigen NEGATIVE NEGATIVE    Comment:        Infection due to S. pneumoniae cannot be absolutely ruled out since the antigen present may be below the detection limit of the test. Performed at [facility], [address], [place], [place] [address]   CBG monitoring, ED     Status: Abnormal   Collection Time: 11/30/23 12:05 PM  Result Value Ref Range   Glucose-Capillary 234 (H) 70 - 99 mg/dL    Comment: Glucose reference range applies only to samples taken after fasting for at least 8 hours.  CBG monitoring, ED     Status: Abnormal   Collection Time: 11/30/23  3:59 PM  Result Value Ref Range   Glucose-Capillary 232 (H) 70 - 99 mg/dL    Comment: Glucose reference range applies only to samples taken after fasting for at least 8 hours.   Comment 1 Notify RN    Comment 2 Document in Chart   Glucose, capillary     Status: None   Collection Time: 11/30/23  8:22 PM  Result Value Ref Range   Glucose-Capillary 85 70 - 99 mg/dL    Comment: Glucose reference range applies only to samples taken after fasting for at least 8 hours.  Glucose, capillary     Status: Abnormal   Collection Time: 12/01/23  1:00 AM  Result Value Ref Range   Glucose-Capillary 274 (H) 70 - 99 mg/dL    Comment: Glucose reference range applies only to samples taken after fasting for at least 8 hours.  Glucose, capillary     Status: Abnormal   Collection Time: 12/01/23  4:25 AM  Result Value Ref Range   Glucose-Capillary 136 (H) 70 - 99 mg/dL    Comment: Glucose reference range applies only to samples taken after fasting for at least 8 hours.  Glucose, capillary     Status: None    Collection Time: 12/01/23  7:46 AM  Result Value Ref Range   Glucose-Capillary 75 70 - 99 mg/dL    Comment: Glucose reference range applies only to samples taken after fasting for at least 8 hours.  Glucose, capillary     Status: Abnormal   Collection Time: 12/01/23 11:09 AM  Result Value Ref Range   Glucose-Capillary 262 (H) 70 - 99 mg/dL    Comment: Glucose reference range applies only to samples taken after fasting for at least 8 hours.  Glucose, capillary     Status: Abnormal   Collection Time: 12/01/23  4:17 PM  Result Value Ref Range   Glucose-Capillary 125 (H) 70 - 99 mg/dL    Comment: Glucose reference range applies only to samples taken after fasting for at least 8 hours.       Assessment & Plan:   Problem List Items Addressed This Visit       Other   Chronic generalized abdominal pain (Primary Area of Pain) (Chronic)   Chronic pain syndrome   Referral to pain clinic has been sent.  Provided phone number for patient so she can call them.         Abdominal pain   Relevant Orders   Ambulatory referral to Gastroenterology   DG Abd 2 Views (Completed)   Other Visit Diagnoses       Type 2  diabetes mellitus with hyperglycemia, without long-term current use of insulin (HCC)    -  Primary   Relevant Orders   POCT CBG (Fasting - Glucose) (Completed)     History of colon polyps       Relevant Orders   Ambulatory referral to Gastroenterology       Return in about 1 month (around 12/11/2023) for F/U.   Total time spent: 20 minutes  Miki Kins, FNP  11/10/2023   This document may have been prepared by [facility] Voice Recognition software and as such may include unintentional dictation errors.

## 2023-11-20 ENCOUNTER — Encounter: Payer: Self-pay | Admitting: Family

## 2023-11-26 NOTE — Assessment & Plan Note (Signed)
 Patient stable.  Well controlled with current therapy.   Continue current meds.

## 2023-11-26 NOTE — Assessment & Plan Note (Signed)
 Setting patient up for referral to psychiatry. .  Will defer to them for further treatment changes.  Reassess at follow up.

## 2023-11-26 NOTE — Assessment & Plan Note (Signed)
 Setting patient up for referral to Pulmonology. .  Will defer to them for further treatment changes.  Reassess at follow up.

## 2023-11-26 NOTE — Assessment & Plan Note (Signed)
 Setting up referral to the pain clinic.  Will defer to them for further treatment.   I will bridge her pain meds until then.

## 2023-11-28 ENCOUNTER — Emergency Department: Payer: No Typology Code available for payment source

## 2023-11-28 ENCOUNTER — Inpatient Hospital Stay
Admission: EM | Admit: 2023-11-28 | Discharge: 2023-12-01 | DRG: 193 | Disposition: A | Discharge status: Home / Self Care | Payer: No Typology Code available for payment source | Attending: Internal Medicine | Admitting: Internal Medicine

## 2023-11-28 ENCOUNTER — Other Ambulatory Visit: Payer: Self-pay

## 2023-11-28 ENCOUNTER — Encounter: Payer: Self-pay | Admitting: Radiology

## 2023-11-28 DIAGNOSIS — J440 Chronic obstructive pulmonary disease with acute lower respiratory infection: Secondary | ICD-10-CM | POA: Diagnosis present

## 2023-11-28 DIAGNOSIS — M797 Fibromyalgia: Secondary | ICD-10-CM | POA: Diagnosis present

## 2023-11-28 DIAGNOSIS — Z20828 Contact with and (suspected) exposure to other viral communicable diseases: Secondary | ICD-10-CM | POA: Diagnosis present

## 2023-11-28 DIAGNOSIS — Z7984 Long term (current) use of oral hypoglycemic drugs: Secondary | ICD-10-CM

## 2023-11-28 DIAGNOSIS — Z794 Long term (current) use of insulin: Secondary | ICD-10-CM | POA: Diagnosis not present

## 2023-11-28 DIAGNOSIS — I700 Atherosclerosis of aorta: Secondary | ICD-10-CM | POA: Diagnosis present

## 2023-11-28 DIAGNOSIS — J439 Emphysema, unspecified: Secondary | ICD-10-CM | POA: Diagnosis present

## 2023-11-28 DIAGNOSIS — R1310 Dysphagia, unspecified: Secondary | ICD-10-CM | POA: Diagnosis present

## 2023-11-28 DIAGNOSIS — K224 Dyskinesia of esophagus: Secondary | ICD-10-CM | POA: Diagnosis present

## 2023-11-28 DIAGNOSIS — G894 Chronic pain syndrome: Secondary | ICD-10-CM | POA: Diagnosis present

## 2023-11-28 DIAGNOSIS — Z1152 Encounter for screening for COVID-19: Secondary | ICD-10-CM | POA: Diagnosis not present

## 2023-11-28 DIAGNOSIS — E1165 Type 2 diabetes mellitus with hyperglycemia: Secondary | ICD-10-CM

## 2023-11-28 DIAGNOSIS — I2510 Atherosclerotic heart disease of native coronary artery without angina pectoris: Secondary | ICD-10-CM | POA: Diagnosis present

## 2023-11-28 DIAGNOSIS — Z9981 Dependence on supplemental oxygen: Secondary | ICD-10-CM | POA: Diagnosis not present

## 2023-11-28 DIAGNOSIS — Z716 Tobacco abuse counseling: Secondary | ICD-10-CM

## 2023-11-28 DIAGNOSIS — J45901 Unspecified asthma with (acute) exacerbation: Secondary | ICD-10-CM | POA: Diagnosis present

## 2023-11-28 DIAGNOSIS — F419 Anxiety disorder, unspecified: Secondary | ICD-10-CM | POA: Diagnosis present

## 2023-11-28 DIAGNOSIS — E785 Hyperlipidemia, unspecified: Secondary | ICD-10-CM | POA: Diagnosis present

## 2023-11-28 DIAGNOSIS — J129 Viral pneumonia, unspecified: Principal | ICD-10-CM | POA: Diagnosis present

## 2023-11-28 DIAGNOSIS — T380X5A Adverse effect of glucocorticoids and synthetic analogues, initial encounter: Secondary | ICD-10-CM | POA: Diagnosis present

## 2023-11-28 DIAGNOSIS — F32A Depression, unspecified: Secondary | ICD-10-CM | POA: Diagnosis present

## 2023-11-28 DIAGNOSIS — Z87440 Personal history of urinary (tract) infections: Secondary | ICD-10-CM

## 2023-11-28 DIAGNOSIS — J849 Interstitial pulmonary disease, unspecified: Secondary | ICD-10-CM | POA: Diagnosis present

## 2023-11-28 DIAGNOSIS — K219 Gastro-esophageal reflux disease without esophagitis: Secondary | ICD-10-CM | POA: Diagnosis present

## 2023-11-28 DIAGNOSIS — J441 Chronic obstructive pulmonary disease with (acute) exacerbation: Principal | ICD-10-CM | POA: Insufficient documentation

## 2023-11-28 DIAGNOSIS — Z23 Encounter for immunization: Secondary | ICD-10-CM

## 2023-11-28 DIAGNOSIS — I10 Essential (primary) hypertension: Secondary | ICD-10-CM | POA: Diagnosis present

## 2023-11-28 DIAGNOSIS — Z79899 Other long term (current) drug therapy: Secondary | ICD-10-CM

## 2023-11-28 DIAGNOSIS — Z7952 Long term (current) use of systemic steroids: Secondary | ICD-10-CM

## 2023-11-28 DIAGNOSIS — Z87891 Personal history of nicotine dependence: Secondary | ICD-10-CM

## 2023-11-28 DIAGNOSIS — G4733 Obstructive sleep apnea (adult) (pediatric): Secondary | ICD-10-CM | POA: Diagnosis present

## 2023-11-28 DIAGNOSIS — Z6838 Body mass index (BMI) 38.0-38.9, adult: Secondary | ICD-10-CM

## 2023-11-28 DIAGNOSIS — M549 Dorsalgia, unspecified: Secondary | ICD-10-CM | POA: Diagnosis present

## 2023-11-28 DIAGNOSIS — N3281 Overactive bladder: Secondary | ICD-10-CM | POA: Diagnosis present

## 2023-11-28 DIAGNOSIS — J9621 Acute and chronic respiratory failure with hypoxia: Secondary | ICD-10-CM | POA: Diagnosis present

## 2023-11-28 DIAGNOSIS — Z7951 Long term (current) use of inhaled steroids: Secondary | ICD-10-CM

## 2023-11-28 DIAGNOSIS — J189 Pneumonia, unspecified organism: Secondary | ICD-10-CM

## 2023-11-28 DIAGNOSIS — J9611 Chronic respiratory failure with hypoxia: Secondary | ICD-10-CM | POA: Diagnosis present

## 2023-11-28 LAB — COMPREHENSIVE METABOLIC PANEL
ALT: 22 U/L (ref 0–44)
AST: 30 U/L (ref 15–41)
Albumin: 3.9 g/dL (ref 3.5–5.0)
Alkaline Phosphatase: 101 U/L (ref 38–126)
Anion gap: 11 (ref 5–15)
BUN: 11 mg/dL (ref 6–20)
CO2: 24 mmol/L (ref 22–32)
Calcium: 8.7 mg/dL — ABNORMAL LOW (ref 8.9–10.3)
Chloride: 102 mmol/L (ref 98–111)
Creatinine, Ser: 0.74 mg/dL (ref 0.44–1.00)
GFR, Estimated: >60 mL/min (ref >60)
Glucose, Bld: 110 mg/dL — ABNORMAL HIGH (ref 70–99)
Potassium: 3.6 mmol/L (ref 3.5–5.1)
Sodium: 137 mmol/L (ref 135–145)
Total Bilirubin: 0.7 mg/dL (ref 0.0–1.2)
Total Protein: 7.7 g/dL (ref 6.5–8.1)

## 2023-11-28 LAB — CBC WITH DIFFERENTIAL/PLATELET
Abs Immature Granulocytes: 0.02 10*3/uL (ref 0.00–0.07)
Basophils Absolute: 0 10*3/uL (ref 0.0–0.1)
Basophils Relative: 1 %
Eosinophils Absolute: 0.1 10*3/uL (ref 0.0–0.5)
Eosinophils Relative: 2 %
HCT: 37.4 % (ref 36.0–46.0)
Hemoglobin: 12.5 g/dL (ref 12.0–15.0)
Immature Granulocytes: 0 %
Lymphocytes Relative: 30 %
Lymphs Abs: 2.3 10*3/uL (ref 0.7–4.0)
MCH: 29.2 pg (ref 26.0–34.0)
MCHC: 33.4 g/dL (ref 30.0–36.0)
MCV: 87.4 fL (ref 80.0–100.0)
Monocytes Absolute: 0.4 10*3/uL (ref 0.1–1.0)
Monocytes Relative: 5 %
Neutro Abs: 4.8 10*3/uL (ref 1.7–7.7)
Neutrophils Relative %: 62 %
Platelets: 164 10*3/uL (ref 150–400)
RBC: 4.28 MIL/uL (ref 3.87–5.11)
RDW: 12.9 % (ref 11.5–15.5)
WBC: 7.7 10*3/uL (ref 4.0–10.5)
nRBC: 0 % (ref 0.0–0.2)

## 2023-11-28 LAB — RESP PANEL BY RT-PCR (RSV, FLU A&B, COVID)  RVPGX2
Influenza A by PCR: NEGATIVE
Influenza B by PCR: NEGATIVE
Resp Syncytial Virus by PCR: NEGATIVE
SARS Coronavirus 2 by RT PCR: NEGATIVE

## 2023-11-28 LAB — PROCALCITONIN: Procalcitonin: <0.1 ng/mL

## 2023-11-28 LAB — LACTIC ACID, PLASMA
Lactic Acid, Venous: 0.9 mmol/L (ref 0.5–1.9)
Lactic Acid, Venous: 1.7 mmol/L (ref 0.5–1.9)

## 2023-11-28 MED ORDER — ENOXAPARIN SODIUM 40 MG/0.4ML IJ SOSY
40.0000 mg | PREFILLED_SYRINGE | INTRAMUSCULAR | Status: DC
  Administered 2023-11-29 – 2023-12-01 (×3): 40 mg via SUBCUTANEOUS
  Filled 2023-11-28 (×3): qty 0.4

## 2023-11-28 MED ORDER — IPRATROPIUM-ALBUTEROL 0.5-2.5 (3) MG/3ML IN SOLN
3.0000 mL | Freq: Once | RESPIRATORY_TRACT | Status: AC
  Administered 2023-11-28: 3 mL via RESPIRATORY_TRACT
  Filled 2023-11-28: qty 3

## 2023-11-28 MED ORDER — OXYCODONE-ACETAMINOPHEN 5-325 MG PO TABS
2.0000 | ORAL_TABLET | Freq: Once | ORAL | Status: AC
  Administered 2023-11-28: 2 via ORAL
  Filled 2023-11-28: qty 2

## 2023-11-28 MED ORDER — SODIUM CHLORIDE 0.9 % IV SOLN
2.0000 g | INTRAVENOUS | Status: DC
  Administered 2023-11-29 – 2023-11-30 (×2): 2 g via INTRAVENOUS
  Filled 2023-11-28 (×3): qty 20

## 2023-11-28 MED ORDER — SODIUM CHLORIDE 0.9 % IV SOLN
2.0000 g | Freq: Once | INTRAVENOUS | Status: AC
  Administered 2023-11-28: 2 g via INTRAVENOUS
  Filled 2023-11-28: qty 20

## 2023-11-28 MED ORDER — SODIUM CHLORIDE 0.9 % IV SOLN
500.0000 mg | INTRAVENOUS | Status: DC
  Administered 2023-11-30 – 2023-12-01 (×2): 500 mg via INTRAVENOUS
  Filled 2023-11-28 (×2): qty 5

## 2023-11-28 MED ORDER — IOHEXOL 350 MG/ML SOLN
100.0000 mL | Freq: Once | INTRAVENOUS | Status: AC | PRN
  Administered 2023-11-28: 100 mL via INTRAVENOUS

## 2023-11-28 MED ORDER — SODIUM CHLORIDE 0.9 % IV SOLN
500.0000 mg | Freq: Once | INTRAVENOUS | Status: AC
  Administered 2023-11-28: 500 mg via INTRAVENOUS
  Filled 2023-11-28: qty 5

## 2023-11-28 MED ORDER — METHYLPREDNISOLONE SODIUM SUCC 125 MG IJ SOLR
125.0000 mg | Freq: Once | INTRAMUSCULAR | Status: AC
  Administered 2023-11-28: 125 mg via INTRAVENOUS
  Filled 2023-11-28: qty 2

## 2023-11-28 MED ORDER — SODIUM CHLORIDE 0.9 % IV BOLUS
1000.0000 mL | Freq: Once | INTRAVENOUS | Status: AC
  Administered 2023-11-28: 1000 mL via INTRAVENOUS

## 2023-11-28 NOTE — ED Provider Triage Note (Signed)
 Emergency Medicine Provider Triage Evaluation Note  Kelly Dillon , a 63 y.o. female  was evaluated in triage.  Pt complains of SOB x 1 week. Fever x 3 days. Hx of asthma. Increase usage of albuterol  inhaler. Exposed to sick contacts with influenza at home.   Review of Systems  Positive:  Negative:   Physical Exam  BP (!) 155/88 (BP Location: Left Arm)   Pulse (!) 112   Temp 98.3 F (36.8 C) (Oral)   Resp (!) 21   Ht 4' 11 (1.499 m)   Wt 87.1 kg   SpO2 98%   BMI 38.78 kg/m  Gen:   Awake, no distress   Resp:  Increased effort; bilateral expiratory wheezing MSK:   Moves extremities without difficulty  Other:    Medical Decision Making  Medically screening exam initiated at 3:34 PM.  Appropriate orders placed.  Zitlaly Malson was informed that the remainder of the evaluation will be completed by another provider, this initial triage assessment does not replace that evaluation, and the importance of remaining in the ED until their evaluation is complete.     Hommer Cunliffe A, PA-C 11/28/23 1535

## 2023-11-28 NOTE — ED Triage Notes (Signed)
 Pt states she has had shortness of breath for the past week. Pt states she has had a fever as well. PT states that she takes tylenol  and uses her inhaler and it gets better for a little while then goes back to the shortness of breath. Pt has had a sick contact of the flu with her granddaughter. Pt speaking in full sentences.

## 2023-11-28 NOTE — ED Provider Notes (Signed)
 [facility] Provider Note    Event Date/Time   First MD Initiated Contact with Patient 11/28/23 2010     (approximate)   History   Shortness of Breath   HPI  Kelly Dillon is a 63 y.o. female here with shortness of breath.  The patient presents with multiple complaints.  Her primary complaint is shortness of breath and wheezing.  Patient states she has had a cough and cold recently, which has been progressive worsening.  She has had extreme dyspnea with exertion.  She also states that she was recently treated for UTI and feels like this never got better.  She is also had some left lower quadrant abdominal pain and felt a tearing sensation when she had a bowel movement earlier today.  She reports she feels generally fatigued, and has also noticed some sputum production, sensation that she is having things get stuck in her throat when she swallows.  Denies known history of aspiration.     Physical Exam   Triage Vital Signs: ED Triage Vitals  Encounter Vitals Group     BP 11/28/23 1526 (!) 155/88     Systolic BP Percentile --      Diastolic BP Percentile --      Pulse Rate 11/28/23 1526 (!) 112     Resp 11/28/23 1526 (!) 21     Temp 11/28/23 1526 98.3 F (36.8 C)     Temp Source 11/28/23 1526 Oral     SpO2 11/28/23 1525 98 %     Weight 11/28/23 1526 192 lb (87.1 kg)     Height 11/28/23 1526 4' 11 (1.499 m)     Head Circumference --      Peak Flow --      Pain Score --      Pain Loc --      Pain Education --      Exclude from Growth Chart --     Most recent vital signs: Vitals:   11/28/23 2141 11/28/23 2213  BP: (!) 94/54 118/84  Pulse:  (!) 112  Resp:  18  Temp:    SpO2:  100%     General: Awake, no distress.  CV:  Good peripheral perfusion.  Mild tachycardia. Resp:  Normal work of breathing.  Tachypneic, diffuse wheezing.  Diminished aeration. Abd:  No distention.  Moderate diffuse tenderness. Other:  No lower extremity edema.   ED  Results / Procedures / Treatments   Labs (all labs ordered are listed, but only abnormal results are displayed) Labs Reviewed  COMPREHENSIVE METABOLIC PANEL - Abnormal; Notable for the following components:      Result Value   Glucose, Bld 110 (*)    Calcium  8.7 (*)    All other components within normal limits  RESP PANEL BY RT-PCR (RSV, FLU A&B, COVID)  RVPGX2  CULTURE, BLOOD (ROUTINE X 2)  CULTURE, BLOOD (ROUTINE X 2)  CBC WITH DIFFERENTIAL/PLATELET  LACTIC ACID, PLASMA  PROCALCITONIN  LACTIC ACID, PLASMA     EKG Sinus tachycardia, ventricular 110.  PR 178, QRS 92, QTc 449.  No acute ST elevations or depression.  No EKG evidence of acute ischemia or infarct.     RADIOLOGY Chest x-ray: Streaky perihilar opacities   I also independently reviewed and agree with radiologist interpretations.   PROCEDURES:  Critical Care performed: No   MEDICATIONS ORDERED IN ED: Medications  cefTRIAXone  (ROCEPHIN ) 2 g in sodium chloride  0.9 % 100 mL IVPB (2 g Intravenous New Bag/Given  11/28/23 2301)  azithromycin  (ZITHROMAX ) 500 mg in sodium chloride  0.9 % 250 mL IVPB (has no administration in time range)  ipratropium-albuterol  (DUONEB) 0.5-2.5 (3) MG/3ML nebulizer solution 3 mL (3 mLs Nebulization Given 11/28/23 2129)  ipratropium-albuterol  (DUONEB) 0.5-2.5 (3) MG/3ML nebulizer solution 3 mL (3 mLs Nebulization Given 11/28/23 2129)  ipratropium-albuterol  (DUONEB) 0.5-2.5 (3) MG/3ML nebulizer solution 3 mL (3 mLs Nebulization Given 11/28/23 2129)  methylPREDNISolone  sodium succinate (SOLU-MEDROL ) 125 mg/2 mL injection 125 mg (125 mg Intravenous Given 11/28/23 2120)  iohexol  (OMNIPAQUE ) 350 MG/ML injection 100 mL (100 mLs Intravenous Contrast Given 11/28/23 2151)  oxyCODONE -acetaminophen  (PERCOCET/ROXICET) 5-325 MG per tablet 2 tablet (2 tablets Oral Given 11/28/23 2249)  sodium chloride  0.9 % bolus 1,000 mL (1,000 mLs Intravenous New Bag/Given 11/28/23 2250)     IMPRESSION / MDM / ASSESSMENT AND PLAN /  ED COURSE  I reviewed the triage vital signs and the nursing notes.                              Differential diagnosis includes, but is not limited to, asthma exacerbation, PNA, URI, gastritis, aspiration, diverticulitis.  Patient's presentation is most consistent with acute presentation with potential threat to life or bodily function.  The patient is on the cardiac monitor to evaluate for evidence of arrhythmia and/or significant heart rate changes  63 yo F with PMHx  COPD, chronic pain, DM, HTN, HLD, here with multiple complaints. Primary complaint is SOB and exam has diffuse wheezing, increased WOB and tachypnea. Suspect COPD exacerbation with possible PNA, given her sputum production. IV steroids, abx and breathing tx started. She is tachypneic but not hypoxic. CXR shows streaky perihilar opacities, ? Viral infection. CT Angio obtained, with a/p given her c/o abd pain and complicated diverticulitis. CT scans show multifocal PNA o/w no acute abnormality. LA normal.   Admit for multifocal PNA and COPD exacerbation with increased WOB and diffuse wheezing.  FINAL CLINICAL IMPRESSION(S) / ED DIAGNOSES   Final diagnoses:  COPD exacerbation (HCC)  Community acquired pneumonia, unspecified laterality     Rx / DC Orders   ED Discharge Orders     None        Note:  This document was prepared using Dragon voice recognition software and may include unintentional dictation errors.   Angelena Smalls, MD 11/28/23 [phone]

## 2023-11-29 DIAGNOSIS — J441 Chronic obstructive pulmonary disease with (acute) exacerbation: Secondary | ICD-10-CM | POA: Insufficient documentation

## 2023-11-29 LAB — CBG MONITORING, ED
Glucose-Capillary: 369 mg/dL — ABNORMAL HIGH (ref 70–99)
Glucose-Capillary: 253 mg/dL — ABNORMAL HIGH (ref 70–99)
Glucose-Capillary: 204 mg/dL — ABNORMAL HIGH (ref 70–99)
Glucose-Capillary: 124 mg/dL — ABNORMAL HIGH (ref 70–99)
Glucose-Capillary: 171 mg/dL — ABNORMAL HIGH (ref 70–99)

## 2023-11-29 MED ORDER — TRAZODONE HCL 100 MG PO TABS: 100.0000 mg | ORAL_TABLET | Freq: Every day | ORAL | Status: DC

## 2023-11-29 MED ORDER — TRAZODONE HCL 100 MG PO TABS
200.0000 mg | ORAL_TABLET | Freq: Every day | ORAL | Status: DC
  Administered 2023-11-29 – 2023-11-30 (×3): 200 mg via ORAL
  Filled 2023-11-29 (×3): qty 2

## 2023-11-29 MED ORDER — MELATONIN 5 MG PO TABS
10.0000 mg | ORAL_TABLET | Freq: Every evening | ORAL | Status: DC | PRN
  Administered 2023-11-29: 10 mg via ORAL
  Filled 2023-11-29: qty 2

## 2023-11-29 MED ORDER — MONTELUKAST SODIUM 10 MG PO TABS
10.0000 mg | ORAL_TABLET | Freq: Every day | ORAL | Status: DC
  Administered 2023-11-29 – 2023-12-01 (×3): 10 mg via ORAL
  Filled 2023-11-29 (×3): qty 1

## 2023-11-29 MED ORDER — METHYLPREDNISOLONE SODIUM SUCC 40 MG IJ SOLR
40.0000 mg | Freq: Two times a day (BID) | INTRAMUSCULAR | Status: AC
  Administered 2023-11-29 (×2): 40 mg via INTRAVENOUS
  Filled 2023-11-29 (×2): qty 1

## 2023-11-29 MED ORDER — ATORVASTATIN CALCIUM 20 MG PO TABS
40.0000 mg | ORAL_TABLET | Freq: Every day | ORAL | Status: DC
  Administered 2023-11-29 – 2023-12-01 (×3): 40 mg via ORAL
  Filled 2023-11-29 (×3): qty 2

## 2023-11-29 MED ORDER — PANTOPRAZOLE SODIUM 40 MG PO TBEC
40.0000 mg | DELAYED_RELEASE_TABLET | Freq: Every day | ORAL | Status: DC
  Administered 2023-11-29 – 2023-12-01 (×3): 40 mg via ORAL
  Filled 2023-11-29 (×3): qty 1

## 2023-11-29 MED ORDER — MOMETASONE FURO-FORMOTEROL FUM 200-5 MCG/ACT IN AERO
2.0000 | INHALATION_SPRAY | Freq: Two times a day (BID) | RESPIRATORY_TRACT | Status: DC
  Administered 2023-11-29 – 2023-12-01 (×5): 2 via RESPIRATORY_TRACT
  Filled 2023-11-29 (×2): qty 8.8

## 2023-11-29 MED ORDER — LOSARTAN POTASSIUM 25 MG PO TABS
25.0000 mg | ORAL_TABLET | Freq: Every day | ORAL | Status: DC
  Administered 2023-11-29 – 2023-12-01 (×3): 25 mg via ORAL
  Filled 2023-11-29 (×3): qty 1

## 2023-11-29 MED ORDER — FLUTICASONE FUROATE-VILANTEROL 100-25 MCG/ACT IN AEPB
1.0000 | INHALATION_SPRAY | Freq: Every day | RESPIRATORY_TRACT | Status: DC
  Administered 2023-11-29 – 2023-12-01 (×3): 1 via RESPIRATORY_TRACT
  Filled 2023-11-29: qty 28

## 2023-11-29 MED ORDER — METFORMIN HCL 500 MG PO TABS: 1000.0000 mg | ORAL_TABLET | Freq: Two times a day (BID) | ORAL | Status: DC

## 2023-11-29 MED ORDER — INSULIN ASPART 100 UNIT/ML IJ SOLN
0.0000 [IU] | INTRAMUSCULAR | Status: DC
  Administered 2023-11-29: 11 [IU] via SUBCUTANEOUS
  Administered 2023-11-29: 4 [IU] via SUBCUTANEOUS
  Administered 2023-11-29: 3 [IU] via SUBCUTANEOUS
  Administered 2023-11-29: 7 [IU] via SUBCUTANEOUS
  Administered 2023-11-29: 20 [IU] via SUBCUTANEOUS
  Administered 2023-11-30: 4 [IU] via SUBCUTANEOUS
  Administered 2023-11-30: 7 [IU] via SUBCUTANEOUS
  Administered 2023-11-30: 11 [IU] via SUBCUTANEOUS
  Administered 2023-11-30: 7 [IU] via SUBCUTANEOUS
  Administered 2023-11-30: 4 [IU] via SUBCUTANEOUS
  Administered 2023-12-01: 3 [IU] via SUBCUTANEOUS
  Administered 2023-12-01 (×2): 11 [IU] via SUBCUTANEOUS
  Filled 2023-11-29 (×13): qty 1

## 2023-11-29 MED ORDER — INSULIN GLARGINE-YFGN 100 UNIT/ML ~~LOC~~ SOLN
15.0000 [IU] | Freq: Every day | SUBCUTANEOUS | Status: DC
  Administered 2023-11-29 – 2023-12-01 (×2): 15 [IU] via SUBCUTANEOUS
  Filled 2023-11-29 (×3): qty 0.15

## 2023-11-29 MED ORDER — INSULIN GLARGINE-YFGN 100 UNIT/ML ~~LOC~~ SOLN: 15.0000 [IU] | Freq: Every day | SUBCUTANEOUS | Status: DC

## 2023-11-29 MED ORDER — OXYBUTYNIN CHLORIDE 5 MG PO TABS
5.0000 mg | ORAL_TABLET | Freq: Every day | ORAL | Status: DC
  Administered 2023-11-29 – 2023-12-01 (×3): 5 mg via ORAL
  Filled 2023-11-29 (×4): qty 1

## 2023-11-29 MED ORDER — PREGABALIN 75 MG PO CAPS
200.0000 mg | ORAL_CAPSULE | Freq: Every day | ORAL | Status: DC
  Administered 2023-11-29 – 2023-12-01 (×3): 200 mg via ORAL
  Filled 2023-11-29 (×3): qty 1

## 2023-11-29 MED ORDER — PREDNISONE 20 MG PO TABS
40.0000 mg | ORAL_TABLET | Freq: Every day | ORAL | Status: DC
  Administered 2023-11-30 – 2023-12-01 (×2): 40 mg via ORAL
  Filled 2023-11-29 (×2): qty 2

## 2023-11-29 MED ORDER — OXYCODONE-ACETAMINOPHEN 5-325 MG PO TABS
1.0000 | ORAL_TABLET | Freq: Four times a day (QID) | ORAL | Status: DC | PRN
  Administered 2023-11-29 – 2023-11-30 (×4): 1 via ORAL
  Filled 2023-11-29 (×4): qty 1

## 2023-11-29 MED ORDER — VENLAFAXINE HCL ER 75 MG PO CP24
300.0000 mg | ORAL_CAPSULE | Freq: Every day | ORAL | Status: DC
  Administered 2023-11-29 – 2023-12-01 (×3): 300 mg via ORAL
  Filled 2023-11-29 (×2): qty 2
  Filled 2023-11-29: qty 4

## 2023-11-29 MED ORDER — UMECLIDINIUM BROMIDE 62.5 MCG/ACT IN AEPB
1.0000 | INHALATION_SPRAY | Freq: Every day | RESPIRATORY_TRACT | Status: DC
  Administered 2023-11-29 – 2023-12-01 (×3): 1 via RESPIRATORY_TRACT
  Filled 2023-11-29 (×2): qty 7

## 2023-11-29 MED ORDER — METHOCARBAMOL 500 MG PO TABS
500.0000 mg | ORAL_TABLET | Freq: Three times a day (TID) | ORAL | Status: DC
  Administered 2023-11-29 – 2023-12-01 (×8): 500 mg via ORAL
  Filled 2023-11-29 (×8): qty 1

## 2023-11-29 MED ORDER — SENNOSIDES-DOCUSATE SODIUM 8.6-50 MG PO TABS: 2.0000 | ORAL_TABLET | Freq: Two times a day (BID) | ORAL | Status: DC

## 2023-11-29 MED ORDER — HYDROXYZINE HCL 25 MG PO TABS: 50.0000 mg | ORAL_TABLET | ORAL | Status: DC | PRN

## 2023-11-29 MED ORDER — BENZONATATE 100 MG PO CAPS
100.0000 mg | ORAL_CAPSULE | Freq: Three times a day (TID) | ORAL | Status: DC | PRN
  Administered 2023-11-29: 100 mg via ORAL
  Filled 2023-11-29: qty 1

## 2023-11-29 MED ORDER — MELATONIN 5 MG PO TABS: 5.0000 mg | ORAL_TABLET | Freq: Every evening | ORAL | Status: DC | PRN

## 2023-11-29 MED ORDER — ALBUTEROL SULFATE (2.5 MG/3ML) 0.083% IN NEBU: 3.0000 mL | INHALATION_SOLUTION | Freq: Four times a day (QID) | RESPIRATORY_TRACT | Status: DC | PRN

## 2023-11-29 NOTE — IPAL (Addendum)
  Interdisciplinary Goals of Care Family Meeting   Date carried out: 11/29/2023  Location of the meeting: Bedside  Member's involved: Nurse Practitioner and Bedside Registered Nurse  Durable Power of Attorney or acting medical decision maker: self  Discussion: Advance Care Planning/Goals of Care discussion was performed during the course of treatment to decide on type of care right for this patient following admission to the hospital.  We discussed goals of care for [facility] as below:  Code status:   Code Status: Full Code   Disposition: Continue current acute care   Family or patient are satisfied with Plan of action and management. All questions answered     Total Time Spent Face to Face addressing advance care planning in the presence of the Patient: 35 minutes      Almarie Nose, DNP, CCRN, FNP-C, AGACNP-BC Acute Care & Family Nurse Practitioner  [facility]  See Amion for personal pager PCCM on call pager [phone] until 7 am

## 2023-11-29 NOTE — Progress Notes (Signed)
  Progress Note   Patient: Kelly Dillon FMW:[id number] DOB: 1961/03/04 DOA: 11/28/2023     1 DOS: the patient was seen and examined on 11/29/2023   Brief hospital course: Kelly Dillon is a 63 y.o. female with treatment-resistant Asthma/smoking related ILD,COPD (3L O2 at home, but PFTs in 2019 w/o evidence of obstruction),OSA, NIDDM2, chronic pain, chronic use of opioids, Anxiety and Depression (suicide attempt 2013), fibromyalgia, diverticulitis (s/p sigmoid resection 2005) and w/ ongoing tobacco abuse, CAD, HTN presenting to the ED approximately 1 week of increasingly productive cough, dyspnea sore throat, difficulty swallowing and fevers x2 days  She is diagnosed with viral pneumonia and COPD exacerbation.  Was placed on steroids and antibiotics.   Principal Problem:   Acute on chronic respiratory failure with hypoxemia (HCC)   Assessment and Plan: Acute on chronic respiratory failure with hypoxemia. Left-sided pneumonia. COPD exacerbation. Continue antibiotics and steroids.  Wean oxygen.  Uncontrolled type 2 diabetes with hyperglycemia on long-term insulin  use. Restart insulin  glargine, scheduled NovoLog  and sliding scale insulin .  History of moderate esophageal dysmotility. Chronic dysphagia. Refer patient to GI after discharge.  Essential hypertension. Continue some home medicines.  Chronic pain. Fibromyalgia. Anxiety and depression. Added Percocet while in the hospital.  Morbid obesity. BMI 38.78 with comorbidities. Diet exercise advised.    Subjective:  Patient still complaining short of breath with exertion, cough. Chronic back pain.  Physical Exam: Vitals:   11/29/23 0300 11/29/23 0329 11/29/23 0710 11/29/23 1123  BP: 120/73  115/78 (!) 152/102  Pulse: (!) 110  86 77  Resp: (!) 24  (!) 24 (!) 22  Temp:  97.8 F (36.6 C) 98.2 F (36.8 C) (!) 97.2 F (36.2 C)  TempSrc:  Oral Oral Oral  SpO2: 95%  96% 97%  Weight:      Height:       General exam: Appears calm  and comfortable  Respiratory system: Decreased breathing sounds.SABRA Respiratory effort normal. Cardiovascular system: S1 & S2 heard, RRR. No JVD, murmurs, rubs, gallops or clicks. No pedal edema. Gastrointestinal system: Abdomen is nondistended, soft and nontender. No organomegaly or masses felt. Normal bowel sounds heard. Central nervous system: Alert and oriented. No focal neurological deficits. Extremities: Symmetric 5 x 5 power. Skin: No rashes, lesions or ulcers Psychiatry: Judgement and insight appear normal. Mood & affect appropriate.    Data Reviewed:  CT scan results reviewed, lab results reviewed.  Family Communication: none  Disposition: Status is: Inpatient Remains inpatient appropriate because: Severity of disease, IV treatment.     Time spent: no charge minutes  Author: Murvin Mana, MD 11/29/2023 1:59 PM  For on call review [email or web address].

## 2023-11-29 NOTE — ED Notes (Signed)
 Pt ambulated to the bathroom with walker and standby with RN. Pt tolerated well - no increase WOB, when returned to the room pt was satting 90-92%

## 2023-11-29 NOTE — Hospital Course (Addendum)
 Kelly Dillon is a 63 y.o. female with treatment-resistant Asthma/smoking related ILD,COPD (3L O2 at home, but PFTs in 2019 w/o evidence of obstruction),OSA, NIDDM2, chronic pain, chronic use of opioids, Anxiety and Depression (suicide attempt 2013), fibromyalgia, diverticulitis (s/p sigmoid resection 2005) and w/ ongoing tobacco abuse, CAD, HTN presenting to the ED approximately 1 week of increasingly productive cough, dyspnea sore throat, difficulty swallowing and fevers x2 days  She is diagnosed with viral pneumonia and COPD exacerbation.  Was placed on steroids and antibiotics. Patient condition has improved, now she is medically stable to be discharged.  Patient was chronically on 3 L oxygen, currently on 2 L oxygen.  Patient will need portable oxygen.

## 2023-11-29 NOTE — H&P (Addendum)
 HISTORY AND PHYSICAL                                                 NAME:  Kelly Dillon,  MRN:  [id number],  DOB:  October 07, 1961, LOS: 1 ADMISSION DATE:  11/28/2023,  REFERRING MD: Angelena Smalls, CHIEF COMPLAINT:  shortness of breath   HPI  Kelly Dillon is a 63 y.o. female with treatment-resistant Asthma/smoking related ILD,COPD (3L O2 at home, but PFTs in 2019 w/o evidence of obstruction),OSA, NIDDM2, chronic pain, chronic use of opioids, Anxiety and Depression (suicide attempt 2013), fibromyalgia, diverticulitis (s/p sigmoid resection 2005) and w/ ongoing tobacco abuse, CAD, HTN presenting to the ED approximately 1 week of increasingly productive cough, dyspnea sore throat, difficulty swallowing and fevers x2 days    ED Course: Initial vital signs showed T (!) 36.8 C (98.3 F), HR (!) 112, BP (!) 155/88, RR 21, and SpO2 98 % on 3L [place] Pertinent Labs/Diagnostics Findings:  Na 137, K 3.6, HCO3 24, BUN 11, sCr 0.74.  CBC unremarkable.  Lactate normal,  Basic and extended RVP pending, Flu negative, COVID-negative.  Chest x-ray without focal consolidations, and possible atelectasis noted in the left lower lobe. CTA Chest negative for PE, CT abd/pelvis with no acute finding.   ED Treatment: Received APAP X1, DuoNeb's x 3, Solu-Medrol  125 mg, Azithromycin  500 mg, Ceftriaxone  1 g, albuterol  nebs, 1 L LR. Disposition: Admitted to [facility] for further management.  Past Medical History  Asthma/smoking related ILD,COPD (3L O2 at home, but PFTs in 2019 w/o evidence of obstruction),OSA, NIDDM2, chronic pain, chronic use of opioids, Anxiety and Depression (suicide attempt 2013), fibromyalgia, diverticulitis (s/p sigmoid resection 2005) and w/ ongoing tobacco abuse, CAD, HTN  Significant Hospital Events   11/27/22: Admit to med surg with acute on chronic hypoxic respiratory failure in setting of  suspected viral pneumonia and AECOPD/Asthma vs ILD flare   Interim History / Subjective:    -Patient  report her breathing is at baseline post treatment.  Consults:  Pulmonologist  Significant Diagnostic Tests:  11/28/23: Chest Xray> IMPRESSION: 1. Streaky perihilar opacities bilaterally may represent viral infection. 2. Atelectasis in the left lower lung.  11/28/23: CTA Chest, abdomen and pelvis> IMPRESSION: 1. No evidence of arterial dilatation or emboli. 2. Chronic interstitial changes in the anterior upper lobes and right middle lobe, with superimposed airspace consolidation in the anterior perihilar left upper lobe most likely due to pneumonia. Follow-up chest CT recommended after treatment to ensure clearing. 3. Increased patchy ground-glass disease in the left lower lobe which could be pneumonitis or air trapping with microatelectasis. 4. Diffuse bronchial thickening consistent with bronchitis or reactive airways disease. 5. Mildly prominent right hilar lymph node. 6. Emphysema. 7. Stable fissural nodules on the left, most likely intrapulmonary lymph nodes. 8. Aortic atherosclerosis. 9. No acute findings in the abdomen or pelvis. 10. Lobulation of portions of the hepatic capsular contour which may be seen in early cirrhosis. 11. Mild splenomegaly. 12. Interval reduction and repair of left abdominal wall bowel containing hernia since 2019. 13. Old ventral hernia repair with multiple mesh screws in place. Mild outward protrusion above the umbilical level but no incarcerated hernia. 14. 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   Micro Data:  1/7: SARS-CoV-2 PCR> negative 1/7: Influenza PCR> negative 1/7: Blood  culture x2> 1/7: Strep pneumo urinary antigen> 1/7: Legionella urinary antigen>  Antimicrobials:  Azithromycin  1/7> Ceftriaxone  1/7>  OBJECTIVE  Blood pressure 118/84, pulse (!) 112, temperature 98.1 F (36.7 C), temperature source Oral, resp. rate 18, height 4' 11 (1.499 m), weight 87.1 kg, SpO2 100%.        No intake or output data in the 24 hours ending 11/29/23 0021 Filed Weights   11/28/23 1526  Weight: 87.1 kg   Physical Examination  GENERAL: 63 year-old morbidly obese patient lying in the bed in no acute distress EYES: PEERLA. No scleral icterus. Extraocular muscles intact.  HEENT: Head atraumatic, normocephalic. Oropharynx and nasopharynx clear.  NECK:  No JVD, supple  LUNGS: Decreased breath sounds bilaterally.  Moderate wheezing bilaterally. No use of accessory muscles of respiration.  CARDIOVASCULAR: S1, S2 normal. No murmurs, rubs, or gallops.  ABDOMEN: Soft, NTND EXTREMITIES: No swelling or erythema. Pulses palpable distally. NEUROLOGIC: The patient is  alert and oriented x 4. No focal neurological deficit appreciated. Cranial nerves are intact.  SKIN: No obvious rash, lesion, or ulcer. Warm to touch Labs/imaging that I havepersonally reviewed  (right click and Reselect all SmartList Selections daily)     Labs   CBC: Recent Labs  Lab 11/28/23 1542  WBC 7.7  NEUTROABS 4.8  HGB 12.5  HCT 37.4  MCV 87.4  PLT 164    Basic Metabolic Panel: Recent Labs  Lab 11/28/23 1542  NA 137  K 3.6  CL 102  CO2 24  GLUCOSE 110*  BUN 11  CREATININE 0.74  CALCIUM  8.7*   GFR: Estimated Creatinine Clearance: 71.8 mL/min (by C-G formula based on SCr of 0.74 mg/dL). Recent Labs  Lab 11/28/23 1542 11/28/23 2131 11/28/23 2318  PROCALCITON  --  <0.10  --   WBC 7.7  --   --   LATICACIDVEN  --  0.9 1.7    Liver Function Tests: Recent Labs  Lab 11/28/23 1542  AST 30  ALT 22  ALKPHOS 101  BILITOT 0.7  PROT 7.7  ALBUMIN 3.9   No results for input(s): LIPASE, AMYLASE in the last 168 hours. No results for input(s): AMMONIA in the last 168 hours.  ABG    Component Value Date/Time   HCO3 25.8 03/28/2017 2121   O2SAT 95.9 03/28/2017 2121     Coagulation Profile: No results for input(s): INR, PROTIME in the last 168 hours.  Cardiac Enzymes: No  results for input(s): CKTOTAL, CKMB, CKMBINDEX, TROPONINI in the last 168 hours.  HbA1C: Hgb A1c MFr Bld  Date/Time Value Ref Range Status  09/11/2023 12:13 PM 7.0 (H) 4.8 - 5.6 % Final    Comment:             Prediabetes: 5.7 - 6.4          Diabetes: >6.4          Glycemic control for adults with diabetes: <7.0   08/27/2021 06:33 AM 5.9 (H) 4.8 - 5.6 % Final    Comment:    (NOTE)         Prediabetes: 5.7 - 6.4         Diabetes: >6.4         Glycemic control for adults with diabetes: <7.0     CBG: No results for input(s): GLUCAP in the last 168 hours.  Review of Systems:   Review of Systems  Constitutional:  Positive for chills and fever. Negative for diaphoresis, malaise/fatigue and weight loss.  HENT:  Positive for  congestion and sore throat.   Eyes: Negative.   Respiratory:  Positive for cough, sputum production, shortness of breath and wheezing. Negative for hemoptysis.   Cardiovascular:  Positive for orthopnea. Negative for chest pain, palpitations, claudication, leg swelling and PND.  Gastrointestinal:  Positive for abdominal pain.  Genitourinary: Negative.   Musculoskeletal:  Positive for back pain and neck pain.  Skin: Negative.   Neurological: Negative.   Endo/Heme/Allergies: Negative.   Psychiatric/Behavioral:  Positive for depression. The patient is nervous/anxious.    Past Medical History  She,  has a past medical history of Acute on chronic respiratory failure with hypoxia (HCC) (08/27/2021), Chronic pain syndrome, Community acquired pneumonia of right lower lobe of lung (08/27/2021), COPD (chronic obstructive pulmonary disease) (HCC), Diabetes mellitus without complication (HCC), Fibromyalgia, Hypertension, and Sepsis (HCC) (08/26/2021).   Surgical History    Past Surgical History:  Procedure Laterality Date   APPENDECTOMY     COLONOSCOPY     COLONOSCOPY WITH PROPOFOL  N/A 03/01/2022   Procedure: COLONOSCOPY WITH PROPOFOL ;  Surgeon: Therisa Bi,  MD;  Location: [facility] ENDOSCOPY;  Service: Gastroenterology;  Laterality: N/A;   ESOPHAGOGASTRODUODENOSCOPY     ESOPHAGOGASTRODUODENOSCOPY N/A 03/01/2022   Procedure: ESOPHAGOGASTRODUODENOSCOPY (EGD);  Surgeon: Therisa Bi, MD;  Location: [facility] ENDOSCOPY;  Service: Gastroenterology;  Laterality: N/A;   HERNIA REPAIR     TUBAL LIGATION     VENTRAL HERNIA REPAIR N/A 08/15/2018   Procedure: HERNIA REPAIR VENTRAL ADULT;  Surgeon: Tye Millet, DO;  Location: [facility] ORS;  Service: General;  Laterality: N/A;   VENTRAL HERNIA REPAIR N/A 08/18/2018   Procedure: LAPAROSCOPIC VENTRAL HERNIA;  Surgeon: Tye Millet, DO;  Location: [facility] ORS;  Service: General;  Laterality: N/A;     Social History   reports that she has quit smoking. Her smoking use included cigarettes. She has never used smokeless tobacco. She reports current drug use. She reports that she does not drink alcohol.   Family History   Her family history is not on file.   Allergies No Known Allergies   Home Medications  Prior to Admission medications   Medication Sig Start Date End Date Taking? Authorizing Provider  albuterol  (PROVENTIL ) (2.5 MG/3ML) 0.083% nebulizer solution Inhale 3 mLs into the lungs every 6 (six) hours as needed. 08/14/23   Scoggins, Amber, NP  atorvastatin  (LIPITOR) 40 MG tablet Take 1 tablet (40 mg total) by mouth daily. 08/14/23   Scoggins, Amber, NP  benzonatate  (TESSALON  PERLES) 100 MG capsule Take 1 capsule (100 mg total) by mouth 3 (three) times daily as needed for cough. 08/14/23 08/13/24  Scoggins, Amber, NP  Blood Glucose Monitoring Suppl (ACCU-CHEK GUIDE ME) w/Device KIT Use to check blood glucose three times daily before meals and at bedtime 09/25/23   Orlean Alan HERO, FNP  celecoxib  (CELEBREX ) 200 MG capsule Take 1 capsule (200 mg total) by mouth in the morning and at bedtime. 08/14/23   Scoggins, Amber, NP  COMFORT EZ PEN NEEDLES 32G X 4 MM MISC 1 Needle in the morning, at noon, in the evening, and at bedtime.  05/13/22   [provider]  fluticasone -salmeterol (ADVAIR) 250-50 MCG/ACT AEPB Inhale 1 puff into the lungs 2 (two) times daily. 08/14/23   Scoggins, Amber, NP  Fluticasone -Umeclidin-Vilant (TRELEGY ELLIPTA ) 100-62.5-25 MCG/ACT AEPB Inhale 1 Inhalation into the lungs daily. 11/10/23   Orlean Alan HERO, FNP  glucose blood (ACCU-CHEK GUIDE) test strip Use to check blood glucose before meals and at bedtime 09/25/23   Orlean Alan HERO, FNP  hydrOXYzine  (ATARAX )  50 MG tablet TAKE 1 TABLET (50 MG TOTAL) BY MOUTH AS NEEDED. 09/07/23   Orlean Alan HERO, FNP  insulin  glargine (LANTUS ) 100 unit/mL SOPN Inject 15 Units into the skin at bedtime. 08/14/23   Scoggins, Amber, NP  insulin  lispro (HUMALOG  KWIKPEN) 200 UNIT/ML KwikPen Inject 12 Units into the skin in the morning, at noon, and at bedtime. 08/14/23   Scoggins, Amber, NP  Ipratropium-Albuterol  (COMBIVENT ) 20-100 MCG/ACT AERS respimat Inhale 1 puff into the lungs every 6 (six) hours as needed. 08/14/23   Scoggins, Amber, NP  Lancets (ONETOUCH ULTRASOFT) lancets Use to check blood sugars twice daily. 11/10/23   Orlean Alan HERO, FNP  losartan  (COZAAR ) 25 MG tablet Take 1 tablet (25 mg total) by mouth 1 day or 1 dose. 08/14/23   Scoggins, Amber, NP  melatonin 5 MG TABS Take 1 tablet by mouth at bedtime as needed.    [provider]  metFORMIN  (GLUCOPHAGE ) 1000 MG tablet Take 1 tablet (1,000 mg total) by mouth 2 (two) times daily with a meal. 08/14/23   Scoggins, Hospital Doctor, NP  methocarbamol  (ROBAXIN ) 500 MG tablet Take 1 tablet (500 mg total) by mouth 3 (three) times daily. 08/14/23   Scoggins, Amber, NP  montelukast  (SINGULAIR ) 10 MG tablet Take 1 tablet by mouth daily.    [provider]  Multiple Vitamins-Minerals (CENTRUM SILVER 50+WOMEN) TABS Take 1 capsule by mouth See admin instructions.    [provider]  mupirocin  cream (BACTROBAN ) 2 % Apply 1 Application topically 2 (two) times daily. 08/14/23   Scoggins, Hospital Doctor, NP   nitroGLYCERIN  (NITROSTAT ) 0.4 MG SL tablet Place 1 tablet (0.4 mg total) under the tongue every 5 (five) minutes as needed for chest pain. 08/14/23 08/13/24  Scoggins, Amber, NP  omeprazole  (PRILOSEC) 20 MG capsule Take 1 capsule (20 mg total) by mouth daily. 08/14/23   Scoggins, Amber, NP  ondansetron  (ZOFRAN -ODT) 4 MG disintegrating tablet DISSOLVE ONE TABLET ON THE TONGUE daily    [provider]  oxybutynin  (DITROPAN ) 5 MG tablet Take 5 mg by mouth daily. 07/03/23   [provider]  OXYGEN 3LPM    [provider]  predniSONE  (DELTASONE ) 20 MG tablet Take 20 mg by mouth 2 (two) times daily. 09/22/23   [provider]  pregabalin  (LYRICA ) 200 MG capsule Take 1 capsule by mouth daily. 10/13/20   [provider]  senna-docusate (SENOKOT-S) 8.6-50 MG tablet Take 2 tablets by mouth in the morning and at bedtime. 01/17/20   [provider]  traZODone  (DESYREL ) 100 MG tablet Take 2 tablets (200 mg total) by mouth at bedtime. 08/14/23   Scoggins, Amber, NP  trospium  (SANCTURA ) 20 MG tablet Take 1 tablet (20 mg total) by mouth daily. 08/14/23   Scoggins, Hospital Doctor, NP  Varenicline  Tartrate, Starter, (CHANTIX  STARTING MONTH PAK) 0.5 MG X 11 & 1 MG X 42 TBPK Use as directed 09/18/23   Scoggins, [facility], NP  venlafaxine  XR (EFFEXOR -XR) 150 MG 24 hr capsule TAKE 2 CAPSULES BY MOUTH DAILY. 09/07/23   Shirley, Amanda M, FNP  Scheduled Meds:  atorvastatin   40 mg Oral Daily   enoxaparin  (LOVENOX ) injection  40 mg Subcutaneous Q24H   fluticasone  furoate-vilanterol  1 puff Inhalation Daily   And   umeclidinium bromide   1 puff Inhalation Daily   insulin  glargine  15 Units Subcutaneous QHS   losartan   25 mg Oral 1 day or 1 dose   metFORMIN   1,000 mg Oral BID WC   methocarbamol   500 mg Oral  TID   mometasone -formoterol   2 puff Inhalation BID   montelukast   10 mg Oral Daily   oxybutynin   5 mg Oral Daily   pantoprazole   40 mg Oral Daily   pregabalin   200 mg Oral Daily    senna-docusate  2 tablet Oral BID   traZODone   200 mg Oral QHS   venlafaxine  XR  300 mg Oral Daily   Continuous Infusions:  azithromycin      cefTRIAXone  (ROCEPHIN )  IV     PRN Meds:.albuterol , benzonatate , hydrOXYzine , melatonin  Active Hospital Problem list   See systems below  Assessment & Plan:  #Acute on Chronic Hypoxic Respiratory Failure #Asthma/COPD exacerbation versus ILD flare #? Viral Pneumonia #Tobacco abuse  Background asthma/COPD on chronic home oxygen at 3 L, current everyday smoker, OSA on quadruple therapy.  CTA negative for PE.  -Supplemental O2 as needed to maintain O2 saturations 88 to 92% -DuoNebs PRN & scheduled,  -Start short course Systemic glucocorticoids given concern for mixed asthma/COPD exacerbation -pulmonary hygiene -BCx, resp cx, Ceftriaxone  and Azithromycin  for 5d course -Continue home regimen Singulair , Breo Ellipta , Dulera ,umeclidinium bromide   -Smoking cessation counseling -Low threshold to consult pulmonology for evaluation of ILD flare vs extend steroid course  #GERD s/p heller myotomy and fundoplication (2021) #Hx of Moderate Esophageal Dysmotility C/o ongoing  sore throat and difficulty swallowing on Omeprazole  40 mg daily at home. -start Protonix  twice daily -Tums twice daily as needed -GI cocktail BID prn  #Anxiety and Depression #Fibromyalgia #Chronic pain -Continue home pregabalin , venlafaxine , hydroxyzine  -Continue celecoxib , and Robaxin  as needed for pain -Continue home trazodone  200 mg nightly as needed  #IDDM2 #HLD -Check A1c -Continue home Glargine 15 units, add lispro 5 units once able to tolerate po plus SSI -Suspect will need increased insulin  control given steroids as above -Hold metformin  for now  #CAD #HTN #HLD -Continue home atorvastatin  40 mg nightly  #Overactive bladder -Continue home oxybutynin      Best practice:  Diet:  Oral DVT prophylaxis: LMWH GI prophylaxis: PPI Glucose control:  SSI Yes  and Basal insulin  Yes Mobility:  bed rest  PT consulted: Yes Last date of multidisciplinary goals of care discussion [Discussed plan of care with patient at the time of admission] Code Status:  full code Disposition: Expected discharge back home in the nest 1-2 days   = Goals of Care = Code Status Order: FULL  Primary Emergency Contact: Janak,Carla Patient wishes to pursue full aggressive treatment and intervention options, including CPR and intubation, but goals of care will be addressed on going with family if that should become necessary.  Total Time: 55 minutes        Almarie Nose DNP, CCRN, FNP-C, AGACNP-BC Acute Care & Family Nurse Practitioner [facility] P[facility] on call pager [phone]

## 2023-11-30 DIAGNOSIS — Z794 Long term (current) use of insulin: Secondary | ICD-10-CM | POA: Diagnosis not present

## 2023-11-30 DIAGNOSIS — J441 Chronic obstructive pulmonary disease with (acute) exacerbation: Secondary | ICD-10-CM

## 2023-11-30 DIAGNOSIS — E1165 Type 2 diabetes mellitus with hyperglycemia: Secondary | ICD-10-CM

## 2023-11-30 DIAGNOSIS — J9621 Acute and chronic respiratory failure with hypoxia: Secondary | ICD-10-CM

## 2023-11-30 LAB — CBG MONITORING, ED
Glucose-Capillary: 173 mg/dL — ABNORMAL HIGH (ref 70–99)
Glucose-Capillary: 189 mg/dL — ABNORMAL HIGH (ref 70–99)
Glucose-Capillary: 232 mg/dL — ABNORMAL HIGH (ref 70–99)
Glucose-Capillary: 234 mg/dL — ABNORMAL HIGH (ref 70–99)
Glucose-Capillary: 271 mg/dL — ABNORMAL HIGH (ref 70–99)

## 2023-11-30 LAB — RESPIRATORY PANEL BY PCR

## 2023-11-30 LAB — STREP PNEUMONIAE URINARY ANTIGEN: Strep Pneumo Urinary Antigen: NEGATIVE

## 2023-11-30 LAB — HIV ANTIBODY (ROUTINE TESTING W REFLEX): HIV Screen 4th Generation wRfx: NONREACTIVE

## 2023-11-30 LAB — GLUCOSE, CAPILLARY: Glucose-Capillary: 85 mg/dL (ref 70–99)

## 2023-11-30 MED ORDER — PNEUMOCOCCAL 20-VAL CONJ VACC 0.5 ML IM SUSY
0.5000 mL | PREFILLED_SYRINGE | INTRAMUSCULAR | Status: AC
  Administered 2023-12-01: 0.5 mL via INTRAMUSCULAR
  Filled 2023-11-30: qty 0.5

## 2023-11-30 NOTE — Plan of Care (Signed)

## 2023-11-30 NOTE — Progress Notes (Signed)
  Progress Note   Patient: Kelly Dillon FMW:[id number] DOB: 04-Oct-1961 DOA: 11/28/2023     2 DOS: the patient was seen and examined on 11/30/2023   Brief hospital course: Kelly Dillon is a 63 y.o. female with treatment-resistant Asthma/smoking related ILD,COPD (3L O2 at home, but PFTs in 2019 w/o evidence of obstruction),OSA, NIDDM2, chronic pain, chronic use of opioids, Anxiety and Depression (suicide attempt 2013), fibromyalgia, diverticulitis (s/p sigmoid resection 2005) and w/ ongoing tobacco abuse, CAD, HTN presenting to the ED approximately 1 week of increasingly productive cough, dyspnea sore throat, difficulty swallowing and fevers x2 days  She is diagnosed with viral pneumonia and COPD exacerbation.  Was placed on steroids and antibiotics.   Principal Problem:   Acute on chronic respiratory failure with hypoxemia (HCC) Active Problems:   Morbid obesity (HCC)   Uncontrolled type 2 diabetes mellitus with hyperglycemia, with long-term current use of insulin  (HCC)   Essential hypertension   COPD with acute exacerbation (HCC)   Assessment and Plan: Acute on chronic respiratory failure with hypoxemia. Left-sided pneumonia. COPD exacerbation. Patient still has significant wheezing and shortness of breath.  Oxygenation appeared to be better.  Continue antibiotics, continue steroids.   Uncontrolled type 2 diabetes with hyperglycemia on long-term insulin  use. Restart insulin  glargine, scheduled NovoLog  and sliding scale insulin . Has worsening hyperglycemia from steroids.  Continue to monitor.   History of moderate esophageal dysmotility. Chronic dysphagia. Refer patient to GI after discharge.   Essential hypertension. Continue some home medicines.   Chronic pain. Fibromyalgia. Anxiety and depression. Added Percocet while in the hospital.   Morbid obesity. BMI 38.78 with comorbidities. Diet exercise advised.       Subjective:  She still has secondary short of breath with  minimal exertion.  Cough, nonproductive.  No fever or chills.  Physical Exam: Vitals:   11/30/23 0000 11/30/23 0630 11/30/23 0900 11/30/23 1027  BP: 116/74 102/80 129/85 122/80  Pulse: 99 63 91 88  Resp: (!) 21 19 19 18   Temp: 98.1 F (36.7 C) 98 F (36.7 C)  98.3 F (36.8 C)  TempSrc: Oral Oral  Oral  SpO2: 98% 98% 95% 94%  Weight:      Height:       General exam: Appears calm and comfortable  Respiratory system: Decreased breath sounds. Respiratory effort normal. Cardiovascular system: S1 & S2 heard, RRR. No JVD, murmurs, rubs, gallops or clicks. No pedal edema. Gastrointestinal system: Abdomen is nondistended, soft and nontender. No organomegaly or masses felt. Normal bowel sounds heard. Central nervous system: Alert and oriented. No focal neurological deficits. Extremities: Symmetric 5 x 5 power. Skin: No rashes, lesions or ulcers Psychiatry: Judgement and insight appear normal. Mood & affect appropriate.    Data Reviewed:  Lab results reviewed.  Family Communication: None  Disposition: Status is: Inpatient Remains inpatient appropriate because:  Disease severity.  IV treatment.     Time spent: 35 minutes  Author: Murvin Mana, MD 11/30/2023 11:33 AM  For on call review [email or web address].

## 2023-11-30 NOTE — Plan of Care (Signed)

## 2023-12-01 DIAGNOSIS — J9621 Acute and chronic respiratory failure with hypoxia: Secondary | ICD-10-CM | POA: Diagnosis not present

## 2023-12-01 DIAGNOSIS — J441 Chronic obstructive pulmonary disease with (acute) exacerbation: Secondary | ICD-10-CM | POA: Diagnosis not present

## 2023-12-01 LAB — GLUCOSE, CAPILLARY
Glucose-Capillary: 274 mg/dL — ABNORMAL HIGH (ref 70–99)
Glucose-Capillary: 136 mg/dL — ABNORMAL HIGH (ref 70–99)
Glucose-Capillary: 75 mg/dL (ref 70–99)
Glucose-Capillary: 262 mg/dL — ABNORMAL HIGH (ref 70–99)
Glucose-Capillary: 125 mg/dL — ABNORMAL HIGH (ref 70–99)

## 2023-12-01 LAB — LEGIONELLA PNEUMOPHILA SEROGP 1 UR AG: L. pneumophila Serogp 1 Ur Ag: NEGATIVE

## 2023-12-01 MED ORDER — AZITHROMYCIN 250 MG PO TABS: 500.0000 mg | ORAL_TABLET | Freq: Every day | ORAL | Status: DC

## 2023-12-01 MED ORDER — PREDNISONE 20 MG PO TABS: 20.0000 mg | ORAL_TABLET | Freq: Two times a day (BID) | ORAL | 0 refills | Status: AC

## 2023-12-01 MED ORDER — ENOXAPARIN SODIUM 60 MG/0.6ML IJ SOSY: 0.5000 mg/kg | PREFILLED_SYRINGE | INTRAMUSCULAR | Status: DC

## 2023-12-01 MED ORDER — CEFDINIR 300 MG PO CAPS: 300.0000 mg | ORAL_CAPSULE | Freq: Two times a day (BID) | ORAL | 0 refills | Status: AC

## 2023-12-01 MED ORDER — AZITHROMYCIN 250 MG PO TABS: 500.0000 mg | ORAL_TABLET | Freq: Every day | ORAL | 0 refills | Status: AC

## 2023-12-01 NOTE — Progress Notes (Signed)
 94% on room air at rest

## 2023-12-01 NOTE — Discharge Summary (Signed)
 Physician Discharge Summary   Patient: Kelly Dillon MRN: [id number] DOB: 16-Sep-1961  Admit date:     11/28/2023  Discharge date: 12/01/23  Discharge Physician: Murvin Mana   PCP: Orlean Alan HERO, FNP   Recommendations at discharge:   Follow-up with PCP in 1 week. Refer to pulmonology to be seen in the months. Referred to gastroenterology to be seen in 1 month.  Discharge Diagnoses: Principal Problem:   Acute on chronic respiratory failure with hypoxemia (HCC) Active Problems:   Morbid obesity (HCC)   Uncontrolled type 2 diabetes mellitus with hyperglycemia, with long-term current use of insulin  (HCC)   Essential hypertension   COPD with acute exacerbation (HCC)  Resolved Problems:   * No resolved hospital problems. *  Hospital Course: Kelly Dillon is a 62 y.o. female with treatment-resistant Asthma/smoking related ILD,COPD (3L O2 at home, but PFTs in 2019 w/o evidence of obstruction),OSA, NIDDM2, chronic pain, chronic use of opioids, Anxiety and Depression (suicide attempt 2013), fibromyalgia, diverticulitis (s/p sigmoid resection 2005) and w/ ongoing tobacco abuse, CAD, HTN presenting to the ED approximately 1 week of increasingly productive cough, dyspnea sore throat, difficulty swallowing and fevers x2 days  She is diagnosed with viral pneumonia and COPD exacerbation.  Was placed on steroids and antibiotics. Patient condition has improved, now she is medically stable to be discharged.  Patient was chronically on 3 L oxygen, currently on 2 L oxygen.  Patient will need portable oxygen.  Assessment and Plan:  Acute on chronic respiratory failure with hypoxemia. Left-sided pneumonia. COPD exacerbation. Patient still has significant wheezing and shortness of breath.  Oxygenation appeared to be better.   Currently on 2 L oxygen which is better than at home. Discussed with TOC, patient will need reset up home oxygen with portable oxygen.  Obtained home oxygen evaluation. Continue  completed 5 days course of antibiotics, continue 3 days of prednisone . Refer to pulmonology as outpatient.   Uncontrolled type 2 diabetes with hyperglycemia on long-term insulin  use. Resume home treatment.   History of moderate esophageal dysmotility. Chronic dysphagia. Refer patient to GI after discharge.   Essential hypertension. Continue some home medicines.   Chronic pain. Fibromyalgia. Anxiety and depression. Resume home treatment.   Morbid obesity. BMI 38.78 with comorbidities. Diet exercise advised.      Consultants: None Procedures performed: None  Disposition: Home Diet recommendation:  Discharge Diet Orders (From admission, onward)     Start     Ordered   12/01/23 0000  Diet - low sodium heart healthy        12/01/23 1319           Cardiac diet DISCHARGE MEDICATION: Allergies as of 12/01/2023   No Known Allergies      Medication List     STOP taking these medications    fluticasone -salmeterol 250-50 MCG/ACT Aepb Commonly known as: ADVAIR   Varenicline  Tartrate (Starter) 0.5 MG X 11 & 1 MG X 42 Tbpk Commonly known as: Chantix  Starting Month Pak       TAKE these medications    Accu-Chek Guide Me w/Device Kit Use to check blood glucose three times daily before meals and at bedtime   Accu-Chek Guide test strip Generic drug: glucose blood Use to check blood glucose before meals and at bedtime   albuterol  (2.5 MG/3ML) 0.083% nebulizer solution Commonly known as: PROVENTIL  Inhale 3 mLs into the lungs every 6 (six) hours as needed.   atorvastatin  40 MG tablet Commonly known as: LIPITOR Take 1 tablet (40 mg  total) by mouth daily.   azithromycin  250 MG tablet Commonly known as: ZITHROMAX  Take 2 tablets (500 mg total) by mouth daily for 2 days.   benzonatate  100 MG capsule Commonly known as: Tessalon  Perles Take 1 capsule (100 mg total) by mouth 3 (three) times daily as needed for cough.   cefdinir  300 MG capsule Commonly known as:  OMNICEF  Take 1 capsule (300 mg total) by mouth 2 (two) times daily for 3 days.   celecoxib  200 MG capsule Commonly known as: CELEBREX  Take 1 capsule (200 mg total) by mouth in the morning and at bedtime.   Centrum Silver 50+Women Tabs Take 1 capsule by mouth See admin instructions.   Comfort EZ Pen Needles 32G X 4 MM Misc Generic drug: Insulin  Pen Needle 1 Needle in the morning, at noon, in the evening, and at bedtime.   HumaLOG  KwikPen 200 UNIT/ML KwikPen Generic drug: insulin  lispro Inject 12 Units into the skin in the morning, at noon, and at bedtime. What changed:  how much to take when to take this   hydrOXYzine  50 MG tablet Commonly known as: ATARAX  TAKE 1 TABLET (50 MG TOTAL) BY MOUTH AS NEEDED.   insulin  glargine 100 unit/mL Sopn Commonly known as: LANTUS  Inject 15 Units into the skin at bedtime.   Ipratropium-Albuterol  20-100 MCG/ACT Aers respimat Commonly known as: COMBIVENT  Inhale 1 puff into the lungs every 6 (six) hours as needed.   losartan  25 MG tablet Commonly known as: COZAAR  Take 1 tablet (25 mg total) by mouth 1 day or 1 dose. What changed:  how much to take when to take this   Lyrica  200 MG capsule Generic drug: pregabalin  Take 1 capsule by mouth in the morning, at noon, and at bedtime.   melatonin 5 MG Tabs Take 2 tablets by mouth at bedtime as needed.   metFORMIN  1000 MG tablet Commonly known as: GLUCOPHAGE  Take 1 tablet (1,000 mg total) by mouth 2 (two) times daily with a meal.   methocarbamol  500 MG tablet Commonly known as: ROBAXIN  Take 1 tablet (500 mg total) by mouth 3 (three) times daily.   montelukast  10 MG tablet Commonly known as: SINGULAIR  Take 1 tablet by mouth daily.   mupirocin  cream 2 % Commonly known as: BACTROBAN  Apply 1 Application topically 2 (two) times daily.   nitroGLYCERIN  0.4 MG SL tablet Commonly known as: Nitrostat  Place 1 tablet (0.4 mg total) under the tongue every 5 (five) minutes as needed for chest  pain.   omeprazole  20 MG capsule Commonly known as: PRILOSEC Take 1 capsule (20 mg total) by mouth daily.   ondansetron  4 MG disintegrating tablet Commonly known as: ZOFRAN -ODT DISSOLVE ONE TABLET ON THE TONGUE daily   onetouch ultrasoft lancets Use to check blood sugars twice daily.   oxybutynin  5 MG tablet Commonly known as: DITROPAN  Take 5 mg by mouth daily.   oxyCODONE -acetaminophen  7.5-325 MG tablet Commonly known as: PERCOCET Take 1 tablet by mouth every 6 (six) hours as needed for severe pain (pain score 7-10).   OXYGEN 3LPM   predniSONE  20 MG tablet Commonly known as: DELTASONE  Take 1 tablet (20 mg total) by mouth 2 (two) times daily for 3 days.   traZODone  100 MG tablet Commonly known as: DESYREL  Take 2 tablets (200 mg total) by mouth at bedtime.   Trelegy Ellipta  100-62.5-25 MCG/ACT Aepb Generic drug: Fluticasone -Umeclidin-Vilant Inhale 1 Inhalation into the lungs daily.   trospium  20 MG tablet Commonly known as: SANCTURA  Take 1 tablet (20 mg total)  by mouth daily.   venlafaxine  XR 150 MG 24 hr capsule Commonly known as: EFFEXOR -XR TAKE 2 CAPSULES BY MOUTH DAILY.               Durable Medical Equipment  (From admission, onward)           Start     Ordered   12/01/23 1315  For home use only DME oxygen  Once       Question Answer Comment  Length of Need Lifetime   Mode or (Route) Nasal cannula   Liters per Minute 2   Frequency Continuous (stationary and portable oxygen unit needed)   Oxygen conserving device Yes   Oxygen delivery system Gas      12/01/23 1314            Follow-up Information     Therisa Bi, MD Follow up in 1 month(s).   Specialty: Gastroenterology Contact information: [address] [place] [address] [phone]         Parris Manna, MD Follow up in 1 month(s).   Specialty: Pulmonary Disease Contact information: [address] [place] [address] [phone]          Orlean Alan HERO, FNP Follow up in 1 week(s).   Specialty: Family Medicine Contact information: [address] [place] [address] [phone]                Discharge Exam: Kelly Dillon   11/28/23 1526  Weight: 87.1 kg   General exam: Appears calm and comfortable  Respiratory system: Decreased breath sounds. Respiratory effort normal. Cardiovascular system: S1 & S2 heard, RRR. No JVD, murmurs, rubs, gallops or clicks. No pedal edema. Gastrointestinal system: Abdomen is nondistended, soft and nontender. No organomegaly or masses felt. Normal bowel sounds heard. Central nervous system: Alert and oriented. No focal neurological deficits. Extremities: Symmetric 5 x 5 power. Skin: No rashes, lesions or ulcers Psychiatry: Judgement and insight appear normal. Mood & affect appropriate.    Condition at discharge: good  The results of significant diagnostics from this hospitalization (including imaging, microbiology, ancillary and laboratory) are listed below for reference.   Imaging Studies: CT Angio Chest PE W and/or Wo Contrast Result Date: 11/28/2023 CLINICAL DATA:  Shortness of breath for 1 week, fever x3 days, history of asthma and pulmonary scarring recently requiring increased albuterol  inhaler use. Recent exposure to influenza at home. Painful bowel movements with abdominal pain also noted. EXAM: CT ANGIOGRAPHY CHEST CT ABDOMEN AND PELVIS WITH CONTRAST TECHNIQUE: Multidetector CT imaging of the chest was performed using the standard protocol during bolus administration of intravenous contrast. Multiplanar CT image reconstructions and MIPs were obtained to evaluate the vascular anatomy. Multidetector CT imaging of the abdomen and pelvis was performed using the standard protocol during bolus administration of intravenous contrast. RADIATION DOSE REDUCTION: This exam was performed according to the departmental dose-optimization program which includes automated exposure  control, adjustment of the mA and/or kV according to patient size and/or use of iterative reconstruction technique. CONTRAST:  OMNIPAQUE  IOHEXOL  350 MG/ML SOLN COMPARISON:  Lung cancer screening chest CTs dated 01/13/2023 and 01/13/2022, abdomen and pelvis CT with IV contrast 08/17/2018. FINDINGS: CTA CHEST FINDINGS Cardiovascular: The cardiac size is normal. There are no visible coronary artery calcifications, no pericardial effusion. There is no evidence of pulmonary arterial dilatation or emboli and no aortic aneurysm or dissection. There is mild atherosclerosis in the aorta and great vessels. Pulmonary veins are nondistended. Mediastinum/Nodes: There is a  slightly prominent right mid hilar lymph node measuring 1.2 cm in short axis on 2:51. There are shotty subcentimeter in short axis bilateral hilar and mediastinal nodes with no further intrathoracic adenopathy. Small hiatal hernia with unremarkable thoracic esophagus, thoracic trachea and main bronchi. The thyroid gland and axillary spaces are unremarkable. Lungs/Pleura: Mild features of paraseptal emphysema are again noted in the lung apices. There are chronic ground-glass/interstitial scarring changes in the anterior upper lobes, on the right extending into the apical anterior area, with underlying interstitial reticulation and mild traction bronchiectasis/bronchiolectasis. Similar but less pronounced changes are noted anteriorly in the right middle lobe extending from the hilar region. Today there is superimposed airspace consolidation in the anterior perihilar left upper lobe most likely due to pneumonia, and increased patchy ground-glass disease in the left lower lobe which could be pneumonitis or air trapping with microatelectasis. Diffuse bronchial thickening appears similar. No other new infiltrate is seen or pleural effusions. There is a stable 8 mm fissural nodule in the left lower lobe on 4:64 and a stable 9 mm fissural nodule in the left lower  lung field on 4:70. These are most likely intrapulmonary lymph nodes with no interval change being seen, no new nodule is evident. Musculoskeletal: Mild thoracic spondylosis, mild kyphodextroscoliosis. No acute or significant osseous findings. No mass in the visualized chest wall. Review of the MIP images confirms the above findings. CT ABDOMEN and PELVIS FINDINGS Hepatobiliary: There is lobulation of portions of the hepatic capsular contour which may be seen with early hepatic cirrhosis. There is a calcified granuloma in the dome of segment 4. No mass enhancement is seen. Unremarkable gallbladder and bile ducts. Pancreas: No abnormality. Spleen: Mildly prominent measuring 14 cm in length. No mass enhancement. Adrenals/Urinary Tract: Adrenal glands are unremarkable. Kidneys are normal, without renal calculi, focal lesion, or hydronephrosis. Bladder is unremarkable. Stomach/Bowel: Interval reduction and repair of left abdominal wall bowel containing hernia. Unremarkable gastric wall. Small hiatal hernia. No bowel dilatation, wall thickening or inflammatory changes are seen. Vascular/Lymphatic: Aortic atherosclerosis. No enlarged abdominal or pelvic lymph nodes. Reproductive: Uterus and bilateral adnexa are unremarkable. Other: Old ventral hernia repair with multiple mesh screws in place. Mild outward protrusion above the umbilical level but no incarcerated hernia. There is no free fluid, free hemorrhage, free air or acute inflammatory process. 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 No articular surface collapse is seen. Mild hip DJD. No acute or other significant osseous findings. Lower lumbar facet spurring. Review of the MIP images confirms the above findings. IMPRESSION: 1. No evidence of arterial dilatation or emboli. 2. Chronic interstitial changes in the anterior upper lobes and right middle lobe, with superimposed airspace consolidation in the anterior perihilar left upper  lobe most likely due to pneumonia. Follow-up chest CT recommended after treatment to ensure clearing. 3. Increased patchy ground-glass disease in the left lower lobe which could be pneumonitis or air trapping with microatelectasis. 4. Diffuse bronchial thickening consistent with bronchitis or reactive airways disease. 5. Mildly prominent right hilar lymph node. 6. Emphysema. 7. Stable fissural nodules on the left, most likely intrapulmonary lymph nodes. 8. Aortic atherosclerosis. 9. No acute findings in the abdomen or pelvis. 10. Lobulation of portions of the hepatic capsular contour which may be seen in early cirrhosis. 11. Mild splenomegaly. 12. Interval reduction and repair of left abdominal wall bowel containing hernia since 2019. 13. Old ventral hernia repair with multiple mesh screws in place. Mild outward protrusion above the umbilical level but no incarcerated  hernia. 14. 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 Aortic Atherosclerosis (ICD10-I70.0) and Emphysema (ICD10-J43.9). Electronically Signed   By: Francis Quam M.D.   On: 11/28/2023 22:33   CT ABDOMEN PELVIS W CONTRAST Result Date: 11/28/2023 CLINICAL DATA:  Shortness of breath for 1 week, fever x3 days, history of asthma and pulmonary scarring recently requiring increased albuterol  inhaler use. Recent exposure to influenza at home. Painful bowel movements with abdominal pain also noted. EXAM: CT ANGIOGRAPHY CHEST CT ABDOMEN AND PELVIS WITH CONTRAST TECHNIQUE: Multidetector CT imaging of the chest was performed using the standard protocol during bolus administration of intravenous contrast. Multiplanar CT image reconstructions and MIPs were obtained to evaluate the vascular anatomy. Multidetector CT imaging of the abdomen and pelvis was performed using the standard protocol during bolus administration of intravenous contrast. RADIATION DOSE REDUCTION: This exam was performed according to the  departmental dose-optimization program which includes automated exposure control, adjustment of the mA and/or kV according to patient size and/or use of iterative reconstruction technique. CONTRAST:  OMNIPAQUE  IOHEXOL  350 MG/ML SOLN COMPARISON:  Lung cancer screening chest CTs dated 01/13/2023 and 01/13/2022, abdomen and pelvis CT with IV contrast 08/17/2018. FINDINGS: CTA CHEST FINDINGS Cardiovascular: The cardiac size is normal. There are no visible coronary artery calcifications, no pericardial effusion. There is no evidence of pulmonary arterial dilatation or emboli and no aortic aneurysm or dissection. There is mild atherosclerosis in the aorta and great vessels. Pulmonary veins are nondistended. Mediastinum/Nodes: There is a slightly prominent right mid hilar lymph node measuring 1.2 cm in short axis on 2:51. There are shotty subcentimeter in short axis bilateral hilar and mediastinal nodes with no further intrathoracic adenopathy. Small hiatal hernia with unremarkable thoracic esophagus, thoracic trachea and main bronchi. The thyroid gland and axillary spaces are unremarkable. Lungs/Pleura: Mild features of paraseptal emphysema are again noted in the lung apices. There are chronic ground-glass/interstitial scarring changes in the anterior upper lobes, on the right extending into the apical anterior area, with underlying interstitial reticulation and mild traction bronchiectasis/bronchiolectasis. Similar but less pronounced changes are noted anteriorly in the right middle lobe extending from the hilar region. Today there is superimposed airspace consolidation in the anterior perihilar left upper lobe most likely due to pneumonia, and increased patchy ground-glass disease in the left lower lobe which could be pneumonitis or air trapping with microatelectasis. Diffuse bronchial thickening appears similar. No other new infiltrate is seen or pleural effusions. There is a stable 8 mm fissural nodule in the  left lower lobe on 4:64 and a stable 9 mm fissural nodule in the left lower lung field on 4:70. These are most likely intrapulmonary lymph nodes with no interval change being seen, no new nodule is evident. Musculoskeletal: Mild thoracic spondylosis, mild kyphodextroscoliosis. No acute or significant osseous findings. No mass in the visualized chest wall. Review of the MIP images confirms the above findings. CT ABDOMEN and PELVIS FINDINGS Hepatobiliary: There is lobulation of portions of the hepatic capsular contour which may be seen with early hepatic cirrhosis. There is a calcified granuloma in the dome of segment 4. No mass enhancement is seen. Unremarkable gallbladder and bile ducts. Pancreas: No abnormality. Spleen: Mildly prominent measuring 14 cm in length. No mass enhancement. Adrenals/Urinary Tract: Adrenal glands are unremarkable. Kidneys are normal, without renal calculi, focal lesion, or hydronephrosis. Bladder is unremarkable. Stomach/Bowel: Interval reduction and repair of left abdominal wall bowel containing hernia. Unremarkable gastric wall. Small hiatal hernia. No bowel dilatation, wall thickening  or inflammatory changes are seen. Vascular/Lymphatic: Aortic atherosclerosis. No enlarged abdominal or pelvic lymph nodes. Reproductive: Uterus and bilateral adnexa are unremarkable. Other: Old ventral hernia repair with multiple mesh screws in place. Mild outward protrusion above the umbilical level but no incarcerated hernia. There is no free fluid, free hemorrhage, free air or acute inflammatory process. 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 No articular surface collapse is seen. Mild hip DJD. No acute or other significant osseous findings. Lower lumbar facet spurring. Review of the MIP images confirms the above findings. IMPRESSION: 1. No evidence of arterial dilatation or emboli. 2. Chronic interstitial changes in the anterior upper lobes and right middle lobe,  with superimposed airspace consolidation in the anterior perihilar left upper lobe most likely due to pneumonia. Follow-up chest CT recommended after treatment to ensure clearing. 3. Increased patchy ground-glass disease in the left lower lobe which could be pneumonitis or air trapping with microatelectasis. 4. Diffuse bronchial thickening consistent with bronchitis or reactive airways disease. 5. Mildly prominent right hilar lymph node. 6. Emphysema. 7. Stable fissural nodules on the left, most likely intrapulmonary lymph nodes. 8. Aortic atherosclerosis. 9. No acute findings in the abdomen or pelvis. 10. Lobulation of portions of the hepatic capsular contour which may be seen in early cirrhosis. 11. Mild splenomegaly. 12. Interval reduction and repair of left abdominal wall bowel containing hernia since 2019. 13. Old ventral hernia repair with multiple mesh screws in place. Mild outward protrusion above the umbilical level but no incarcerated hernia. 14. 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 Aortic Atherosclerosis (ICD10-I70.0) and Emphysema (ICD10-J43.9). Electronically Signed   By: Francis Quam M.D.   On: 11/28/2023 22:33   DG Chest 2 View Result Date: 11/28/2023 CLINICAL DATA:  Shortness of breath with fever. EXAM: CHEST - 2 VIEW COMPARISON:  Chest x-ray 08/26/2021 FINDINGS: There some atelectasis in the left lower lung. There is no focal lung infiltrate, pleural effusion or pneumothorax. There some streaky perihilar opacities bilaterally. The cardiomediastinal silhouette is within normal limits. No acute fractures are seen. IMPRESSION: 1. Streaky perihilar opacities bilaterally may represent viral infection. 2. Atelectasis in the left lower lung. Electronically Signed   By: Greig Pique M.D.   On: 11/28/2023 16:14   DG Abd 2 Views Result Date: 11/27/2023 CLINICAL DATA:  LLQ pain. EXAM: ABDOMEN - 2 VIEW COMPARISON:  None Available. FINDINGS: The  bowel gas pattern is normal. There is no evidence of free air. No radio-opaque calculi or other significant radiographic abnormality is seen. There is evidence of a mesh presumably from previous abdominal wall repair. IMPRESSION: Unremarkable bowel gas pattern. Electronically Signed   By: Fonda Field M.D.   On: 11/27/2023 23:16    Microbiology: Results for orders placed or performed during the hospital encounter of 11/28/23  Resp panel by RT-PCR (RSV, Flu A&B, Covid) Anterior Nasal Swab     Status: None   Collection Time: 11/28/23  3:42 PM   Specimen: Anterior Nasal Swab  Result Value Ref Range Status   SARS Coronavirus 2 by RT PCR NEGATIVE NEGATIVE Final    Comment: (NOTE) SARS-CoV-2 target nucleic acids are NOT DETECTED.  The SARS-CoV-2 RNA is generally detectable in upper respiratory specimens during the acute phase of infection. The lowest concentration of SARS-CoV-2 viral copies this assay can detect is 138 copies/mL. A negative result does not preclude SARS-Cov-2 infection and should not be used as the sole basis for treatment or other patient management decisions.  A negative result may occur with  improper specimen collection/handling, submission of specimen other than nasopharyngeal swab, presence of viral mutation(s) within the areas targeted by this assay, and inadequate number of viral copies(<138 copies/mL). A negative result must be combined with clinical observations, patient history, and epidemiological information. The expected result is Negative.  Fact Sheet for Patients:  bloggercourse.com  Fact Sheet for Healthcare Providers:  seriousbroker.it  This test is no t yet approved or cleared by the [place]  FDA and  has been authorized for detection and/or diagnosis of SARS-CoV-2 by FDA under an Emergency Use Authorization (EUA). This EUA will remain  in effect (meaning this test can be used) for the duration  of the COVID-19 declaration under Section 564(b)(1) of the Act, 21 U.S.C.section 360bbb-3(b)(1), unless the authorization is terminated  or revoked sooner.       Influenza A by PCR NEGATIVE NEGATIVE Final   Influenza B by PCR NEGATIVE NEGATIVE Final    Comment: (NOTE) The Xpert Xpress SARS-CoV-2/FLU/RSV plus assay is intended as an aid in the diagnosis of influenza from Nasopharyngeal swab specimens and should not be used as a sole basis for treatment. Nasal washings and aspirates are unacceptable for Xpert Xpress SARS-CoV-2/FLU/RSV testing.  Fact Sheet for Patients: bloggercourse.com  Fact Sheet for Healthcare Providers: seriousbroker.it  This test is not yet approved or cleared by the [place]  FDA and has been authorized for detection and/or diagnosis of SARS-CoV-2 by FDA under an Emergency Use Authorization (EUA). This EUA will remain in effect (meaning this test can be used) for the duration of the COVID-19 declaration under Section 564(b)(1) of the Act, 21 U.S.C. section 360bbb-3(b)(1), unless the authorization is terminated or revoked.     Resp Syncytial Virus by PCR NEGATIVE NEGATIVE Final    Comment: (NOTE) Fact Sheet for Patients: bloggercourse.com  Fact Sheet for Healthcare Providers: seriousbroker.it  This test is not yet approved or cleared by the [place]  FDA and has been authorized for detection and/or diagnosis of SARS-CoV-2 by FDA under an Emergency Use Authorization (EUA). This EUA will remain in effect (meaning this test can be used) for the duration of the COVID-19 declaration under Section 564(b)(1) of the Act, 21 U.S.C. section 360bbb-3(b)(1), unless the authorization is terminated or revoked.  Performed at [facility], [address] [place], [place] [address]   Blood culture (routine x 2)     Status: None (Preliminary result)    Collection Time: 11/28/23  9:31 PM   Specimen: BLOOD  Result Value Ref Range Status   Specimen Description BLOOD LEFT ARM  Final   Special Requests   Final    BOTTLES DRAWN AEROBIC AND ANAEROBIC Blood Culture results may not be optimal due to an inadequate volume of blood received in culture bottles   Culture   Final    NO GROWTH 3 DAYS Performed at [facility], [address], [place], [place] [address]    Report Status PENDING  Incomplete  Blood culture (routine x 2)     Status: None (Preliminary result)   Collection Time: 11/28/23  9:31 PM   Specimen: BLOOD  Result Value Ref Range Status   Specimen Description BLOOD RIGHT ARM  Final   Special Requests   Final    BOTTLES DRAWN AEROBIC AND ANAEROBIC Blood Culture results may not be optimal due to an inadequate volume of blood received in culture bottles   Culture   Final    NO GROWTH 3 DAYS Performed at  [facility], [address] [place], [place] [address]    Report Status PENDING  Incomplete  Respiratory (~20 pathogens) panel by PCR     Status: None   Collection Time: 11/29/23  9:36 PM   Specimen: Nasopharyngeal Swab; Respiratory  Result Value Ref Range Status   Adenovirus NOT DETECTED NOT DETECTED Final   Coronavirus 229E NOT DETECTED NOT DETECTED Final    Comment: (NOTE) The Coronavirus on the Respiratory Panel, DOES NOT test for the novel  Coronavirus (2019 nCoV)    Coronavirus HKU1 NOT DETECTED NOT DETECTED Final   Coronavirus NL63 NOT DETECTED NOT DETECTED Final   Coronavirus OC43 NOT DETECTED NOT DETECTED Final   Metapneumovirus NOT DETECTED NOT DETECTED Final   Rhinovirus / Enterovirus NOT DETECTED NOT DETECTED Final   Influenza A NOT DETECTED NOT DETECTED Final   Influenza B NOT DETECTED NOT DETECTED Final   Parainfluenza Virus 1 NOT DETECTED NOT DETECTED Final   Parainfluenza Virus 2 NOT DETECTED NOT DETECTED Final   Parainfluenza Virus 3 NOT DETECTED NOT DETECTED Final   Parainfluenza  Virus 4 NOT DETECTED NOT DETECTED Final   Respiratory Syncytial Virus NOT DETECTED NOT DETECTED Final   Bordetella pertussis NOT DETECTED NOT DETECTED Final   Bordetella Parapertussis NOT DETECTED NOT DETECTED Final   Chlamydophila pneumoniae NOT DETECTED NOT DETECTED Final   Mycoplasma pneumoniae NOT DETECTED NOT DETECTED Final    Comment: Performed at [facility], [address] [place], [place] [address]    Labs: CBC: Recent Labs  Lab 11/28/23 1542  WBC 7.7  NEUTROABS 4.8  HGB 12.5  HCT 37.4  MCV 87.4  PLT 164   Basic Metabolic Panel: Recent Labs  Lab 11/28/23 1542  NA 137  K 3.6  CL 102  CO2 24  GLUCOSE 110*  BUN 11  CREATININE 0.74  CALCIUM  8.7*   Liver Function Tests: Recent Labs  Lab 11/28/23 1542  AST 30  ALT 22  ALKPHOS 101  BILITOT 0.7  PROT 7.7  ALBUMIN 3.9   CBG: Recent Labs  Lab 11/30/23 2022 12/01/23 0100 12/01/23 0425 12/01/23 0746 12/01/23 1109  GLUCAP 85 274* 136* 75 262*    Discharge time spent: greater than 30 minutes.  Signed: Murvin Mana, MD [facility] 12/01/2023

## 2023-12-01 NOTE — Progress Notes (Addendum)
 Nutrition Brief Note  RD received consult for nutritional assessment per COPD protocol   63 y/o female admitted with COPD exacerbation.   Visited pt room today. Pt in good spirits, sitting up and talking on the phone.   Wt Readings from Last 15 Encounters:  11/28/23 87.1 kg  11/10/23 86.2 kg  09/25/23 87.5 kg  09/11/23 88.9 kg  08/14/23 90.7 kg  03/01/22 83.5 kg  02/08/22 81.8 kg  01/13/22 82.1 kg  08/26/21 90.7 kg  05/17/21 90.7 kg  02/26/19 90.7 kg  08/16/18 82 kg  08/14/18 82.6 kg  02/21/18 89.4 kg  05/06/17 88.5 kg    Body mass index is 38.78 kg/m. Patient meets criteria for overweight based on current BMI. Per chart, pt is weight stable pta.   Current diet order is HH/CHO modified, patient is consuming approximately 100% of meals at this time. Labs and medications reviewed.   No nutrition interventions warranted at this time. If nutrition issues arise, please consult RD.   Kelly Shams MS, RD, LDN If unable to be reached, please send secure chat to RD inpatient available from 8:00a-4:00p daily

## 2023-12-01 NOTE — TOC Progression Note (Addendum)
 Transition of Care [facility]) - Progression Note    Patient Details  Name: Kelly Dillon MRN: [id number] Date of Birth: February 04, 1961  Transition of Care [facility]) CM/SW Contact  Tomasa JAYSON Childes, RN Phone Number: 12/01/2023, 1:14 PM  Clinical Narrative:    Spoke with patient regarding discharge. Patient stated she is currently living in a trailer but does not have heat. She was provided resources for utilites and housing in [place]. Patient states she can stay with her daughter but would have to sleep on the floor due to her daughter having limited space and five children.   Patient is requesting portable oxygen. She stated she got her current oxygen tank when she lived in [place] . She is unable to recall the name of the agency. Mitch from [employer] contacted to advised oxgygen is being requested.   Utility and housing resources added to AVS.            Expected Discharge Plan and Services                                               Social Determinants of Health (SDOH) Interventions SDOH Screenings   Food Insecurity: Food Insecurity Present (11/30/2023)  Housing: High Risk (11/30/2023)  Transportation Needs: No Transportation Needs (11/30/2023)  Utilities: At Risk (11/30/2023)  Financial Resource Strain: Medium Risk (11/22/2022)   Received from [facility], [facility]  Physical Activity: Insufficiently Active (08/21/2019)   Received from [facility], [facility]  Social Connections: Unknown (08/21/2019)   Received from [facility], [facility]  Stress: Stress Concern Present (08/21/2019)   Received from [facility], [facility]  Tobacco Use: Medium Risk (11/28/2023)    Readmission Risk Interventions     No data to display

## 2023-12-01 NOTE — Progress Notes (Signed)
 PHARMACIST - PHYSICIAN COMMUNICATION DR:  Laurita CONCERNING: Antibiotic IV to Oral Route Change Policy  RECOMMENDATION: This patient is receiving azithromycin  by the intravenous route. Based on criteria approved by the Pharmacy and Therapeutics Committee, the antibiotic(s) is/are being converted to the equivalent oral dose form(s).  DESCRIPTION: These criteria include: Patient being treated for a respiratory tract infection, urinary tract infection, cellulitis or clostridium difficile associated diarrhea if on metronidazole The patient is not neutropenic and does not exhibit a GI malabsorption state The patient is eating (either orally or via tube) and/or has been taking other orally administered medications for a least 24 hours The patient is improving clinically and has a Tmax < 100.5  If you have questions about this conversion, please contact the Pharmacy Department.   Evonnie Nieves, PharmD Pharmacy Resident  12/01/2023 12:05 PM

## 2023-12-01 NOTE — Progress Notes (Signed)
 PHARMACIST - PHYSICIAN COMMUNICATION  CONCERNING:  Enoxaparin  (Lovenox ) for DVT Prophylaxis   RECOMMENDATION: Patient was prescribed enoxaparin  40mg  q24 hours for VTE prophylaxis.   Filed Weights   11/28/23 1526  Weight: 87.1 kg (192 lb)   Body mass index is 38.78 kg/m.  Based on [facility] policy patient is candidate for enoxaparin  0.5mg /kg TBW SQ every 24 hours based on BMI being > 30.  DESCRIPTION: Pharmacy has adjusted enoxaparin  dose per [facility] policy.  Patient is now receiving enoxaparin  42.5 mg every 24 hours.   Evonnie Nieves, PharmD Pharmacy Resident  12/01/2023 11:56 AM

## 2023-12-01 NOTE — Discharge Instructions (Addendum)
 Agency Name: [facility] Agency Address: [address], [place], [place] [address] Phone: [phone] Website: [email or web address] Service(s) Offered: Housing services, self-sufficiency, congregate meal program, and individual development account program.  Agency Name: [employer] [facility] [place] Address: [address] [place], [place] [address] Phone: [phone] Email: [email or web address] Website: www.[email or web address].org Service(s) Offered: Housing the homeless, feeding the hungry, Company secretary, job and education related services.  Agency Name: [facility] Address: [address] [place], [place] [address] Phone: [phone] Email: [email or web address] Service(s) Offered: Counseling, problem pregnancy, advocacy for Hispanics, limited emergency financial assistance.  Agency Name: [facility] [employer] Address: [address] [place], [place] [address] Phone: [phone] Website: [email or web address] Service(s) Offered: Child support services; child welfare services; SNAP; Medicaid; work first family assistance; and aid with fuel,  rent, food and medicine.  Agency Name: [employer] [facility] Address: [address] [place], [place] [address] Phone: [phone] or [phone] Email: [email or web address] Service(s) Offered: Family services and transient assistance; emergency food, fuel, clothing, limited furniture, utilities; budget counseling, general counseling; give a kid a coat; thrift store; Christmas food and toys. Utility assistance, food pantry, rental  assistance, life sustaining medicine  Rent/Utility/Housing  Agency Name: [facility] Agency Address: [address] [place], [place] [address] Phone: [phone] Email: [email or web address] Website: [email or web address] Service(s) Offered: Housing services, self-sufficiency, congregate meal  program, weatherization program, Field seismologist program, emergency food assistance,  housing counseling, home ownership program, wheels -towork program.  Agency Name: [facility] Address: [address], [place], [place] [address] Phone: [phone] (8a-4p) [phone] (8p- 10p) Email: [email or web address] Website: [email or web address] Service(s) Offered: A program for homeless and/or needy men that includes one-on-one counseling, life skills training and job rehabilitation.  Agency Name: [employer] [facility] [place] Address: [address], [place], [place] [address] Phone: [phone] Website: www.alliedchurches.org Service(s) Offered: Assistance to needy in emergency with utility bills, heating fuel, and prescriptions. Shelter for homeless 7pm-7am. March 16, 2017 15  Agency Name: [facility] [place] [facility] Address: [address], [place], [place] [address] Phone: [phone] Contact Person: Genie Key Email: [email or web address] Website: [email or web address] Service(s) Offered: Helps individuals with developmental disabilities move from housing that is more restrictive to homes where they  can achieve greater independence and have more  opportunities.  Agency Name: [facility] Address: [address], [place], [place] [address] Phone: [phone] Email: [email or web address] Website: www.[email or web address].org Service(s) Offered: Provides affordable housing for low-income families, elderly, and disabled individuals. Offer a wide range of  programs and services, from financial planning to afterschool and summer programs.  Agency Name: Department of Social Services Address: [address] [place], [place] [address] Phone: [phone] Service(s) Offered: Child support services; child welfare services; food stamps; Medicaid; work first family assistance; and aid with  fuel,  rent, food and medicine.  Agency Name: [facility] [place] [employer]. Address: [facility] [address] [place], [place]  [address] Phone: [phone] Website: www.familyabuseservices.org Service(s) Offered: 24 hour Crisis Line: [phone]; 24 hour Emergency Shelter; Transitional Housing; Support Groups; Scientist, physiological; [employer]; Hispanic Outreach: [phone];  Visitation Center: [phone].  Agency Name: [facility] [place]. Address: [address] [place], [place] [address] Phone: [phone] Service(s) Offered: CAP Services; Home and [employer]; Individual or Group Supports; Respite Care Non-Institutional Nursing;  Residential Supports; Respite Care and Personal Care Services; Transportation; Family and Friends Night; Recreational Activities; Three Nutritious Meals/Snacks; Consultation with Registered Dietician; Twenty-four hour Registered Nurse Access; Daily and [employer]  Living Skills; [employer]aves; [place] for the [employer] (During Summer Months) Bingo Night (Every  Wednesday Night); Special Populations Dance Night  (Every Tuesday Night); Professional Hair Care Services.  Agency Name: [facility] Address: [address], [place], [place] [address] Phone: [phone] Contact Person: Richardo Chandler Website: [email or web address].Physicist, medical) Offered: Residential treatment facility for women; food and  clothing, educational & employment development and  transportation to work; Counsellor of financial skills;  parenting and family reunification; emotional and spiritual  support; transitional housing for program graduates.  Agency Name: [facility] Address: [address] [place], [place] [address] Phone: [phone] Email: [email or web address] Website: [email or web address].es Service(s) Offered: Public housing units for elderly, disabled, and low income  people; housing choice vouchers for income eligible  applicants; shelter plus care vouchers; and Psychologist, clinical.  Agency Name: [employer] [facility] [facility] [employer] Address: [address], [place], [place] [address] Phone: [phone] Email: [email or web address] Website: www.habitatalamance.org Service(s) Offered: Build houses for families in need of decent housing. Each adult in the family must invest 200 hours of labor on  someone else's house, work with volunteers to build their own house, attend classes on budgeting, home maintenance, yard care, and attend homeowner association meetings.  Agency Name: [facility] Address: [address] [place], [place] [address] Phone: [phone] Website: www.rsli.org Service(s) Offered: Intermediate care facilities for intellectually delayed, Supervised Living in group homes for adults with developmental disabilities, Supervised Living for people who have dual diagnoses (MRMI), Independent Living, Supported Living, respite and a variety of CAP services, pre-vocational services, day supports, and [employer].  Agency Name: [place] [employer] Phone: [phone] Website: www.NCForeclosurePrevention.gov Service(s) Offered: Zero-interest, deferred loans to homeowners struggling to pay their mortgage. Call for more information.

## 2023-12-01 NOTE — Progress Notes (Signed)
 SATURATION QUALIFICATIONS: (This note is used to comply with regulatory documentation for home oxygen)  Patient Saturations on Room Air while Ambulating = 87%  Patient Saturations on 2 Liters of oxygen while Ambulating = 92%  Please briefly explain why patient needs home oxygen: Shortness of breath and destaturation while ambulating

## 2023-12-03 LAB — CULTURE, BLOOD (ROUTINE X 2)
Culture: NO GROWTH
Culture: NO GROWTH

## 2023-12-04 ENCOUNTER — Other Ambulatory Visit: Payer: Self-pay

## 2023-12-07 MED ORDER — OXYCODONE-ACETAMINOPHEN 7.5-325 MG PO TABS: 1.0000 | ORAL_TABLET | Freq: Four times a day (QID) | ORAL | 0 refills | Status: DC | PRN

## 2023-12-18 ENCOUNTER — Telehealth: Payer: Self-pay | Admitting: Gastroenterology

## 2023-12-18 NOTE — Telephone Encounter (Signed)
Erie Noe call from devoted to check on the patient referral and she wanted to know when we call to schedule the patient. I inform her that we have called the patient multiple time to try and schedule the patient will have to call us back. She tried to call the patient, but she didn't answer.

## 2024-01-08 ENCOUNTER — Other Ambulatory Visit: Payer: Self-pay

## 2024-01-12 ENCOUNTER — Other Ambulatory Visit: Payer: Self-pay

## 2024-01-12 MED ORDER — LOSARTAN POTASSIUM 50 MG PO TABS: 50.0000 mg | ORAL_TABLET | Freq: Every day | ORAL | 1 refills | Status: AC

## 2024-01-12 MED ORDER — PREGABALIN 200 MG PO CAPS: 200.0000 mg | ORAL_CAPSULE | Freq: Three times a day (TID) | ORAL | 0 refills | Status: DC

## 2024-01-12 MED ORDER — METHOCARBAMOL 500 MG PO TABS: 500.0000 mg | ORAL_TABLET | Freq: Three times a day (TID) | ORAL | 2 refills | Status: DC

## 2024-01-12 MED ORDER — OXYCODONE-ACETAMINOPHEN 7.5-325 MG PO TABS: 1.0000 | ORAL_TABLET | Freq: Four times a day (QID) | ORAL | 0 refills | Status: DC | PRN

## 2024-01-15 ENCOUNTER — Ambulatory Visit: Admission: RE | Admit: 2024-01-15 | Payer: No Typology Code available for payment source | Source: Ambulatory Visit

## 2024-02-06 ENCOUNTER — Other Ambulatory Visit: Payer: Self-pay

## 2024-02-06 MED ORDER — OXYCODONE-ACETAMINOPHEN 7.5-325 MG PO TABS: 1.0000 | ORAL_TABLET | Freq: Four times a day (QID) | ORAL | 0 refills | Status: DC | PRN

## 2024-02-09 ENCOUNTER — Encounter (HOSPITAL_COMMUNITY): Payer: Self-pay

## 2024-02-12 ENCOUNTER — Encounter: Payer: Self-pay | Admitting: Family

## 2024-02-12 NOTE — Assessment & Plan Note (Signed)
 Referral to pain clinic has been sent.  Provided phone number for patient so she can call them.

## 2024-02-22 ENCOUNTER — Other Ambulatory Visit: Payer: Self-pay

## 2024-02-23 MED ORDER — HYDROXYZINE HCL 50 MG PO TABS: 50.0000 mg | ORAL_TABLET | ORAL | 1 refills | Status: DC | PRN

## 2024-02-23 MED ORDER — VENLAFAXINE HCL ER 150 MG PO CP24: 300.0000 mg | ORAL_CAPSULE | Freq: Every day | ORAL | 1 refills | Status: DC

## 2024-03-01 ENCOUNTER — Telehealth: Payer: Self-pay | Admitting: Family

## 2024-03-04 MED ORDER — OXYCODONE-ACETAMINOPHEN 7.5-325 MG PO TABS: 1.0000 | ORAL_TABLET | Freq: Four times a day (QID) | ORAL | 0 refills | Status: DC | PRN

## 2024-03-04 NOTE — Telephone Encounter (Signed)
Pt asked for refill.

## 2024-03-05 ENCOUNTER — Encounter (HOSPITAL_COMMUNITY): Payer: Self-pay | Admitting: Family

## 2024-03-05 ENCOUNTER — Other Ambulatory Visit: Payer: Self-pay

## 2024-03-05 ENCOUNTER — Ambulatory Visit (HOSPITAL_COMMUNITY): Payer: Self-pay | Admitting: Family

## 2024-03-05 VITALS — BP 132/84 | HR 67 | Ht <= 58 in | Wt 209.0 lb

## 2024-03-05 DIAGNOSIS — F323 Major depressive disorder, single episode, severe with psychotic features: Secondary | ICD-10-CM

## 2024-03-05 MED ORDER — ARIPIPRAZOLE 2 MG PO TABS: 2.0000 mg | ORAL_TABLET | Freq: Every day | ORAL | 0 refills | Status: DC
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
## 2024-03-27 ENCOUNTER — Other Ambulatory Visit (HOSPITAL_COMMUNITY): Payer: Self-pay | Admitting: Family

## 2024-04-02 ENCOUNTER — Encounter (HOSPITAL_COMMUNITY): Payer: Self-pay

## 2024-04-02 ENCOUNTER — Ambulatory Visit (HOSPITAL_COMMUNITY): Admitting: Family

## 2024-04-23 ENCOUNTER — Telehealth: Payer: Self-pay

## 2024-04-23 NOTE — Telephone Encounter (Signed)
 Patient contacted office stated that she is not feeling well.  Her stomach has been bothering her.  Chart reviewed she was last seen by Dr. Antony Baumgartner in March of 2023.  She has been advised to contact her PCP to request a new referral to be sent to [facility] as we are currently in transition with new providers starting and unable to schedule in office appts. Pt verbalized understanding and said she will contact her PCP.  Thanks, [place]

## 2024-04-30 ENCOUNTER — Ambulatory Visit: Admitting: Pulmonary Disease

## 2024-04-30 ENCOUNTER — Ambulatory Visit: Payer: Self-pay | Admitting: Internal Medicine

## 2024-04-30 NOTE — Telephone Encounter (Signed)
 Pt calling to reschedule appt as she notes feeling increasingly SOB with coughing episodes, audible wheezing heard by this RN. Pt advised she is proceeding to ED, appt rescheduled per pt. This RN educated pt on home care, new-worsening symptoms, when to call back/seek emergent care. Pt verbalized understanding and agrees to plan.         Copied from CRM [id number]. Topic: Clinical - Red Word Triage >> Apr 30, 2024  1:13 PM Kelly Dillon wrote: Red Word that prompted transfer to Nurse Triage:   Pt has had   Fever since last night, highest temp was 100 Diarrhea started last night around 8 PM Cough for past 2 days. Reason for Disposition  Nursing judgment  Protocols used: No Guideline or Reference Available-A-AH

## 2024-05-10 ENCOUNTER — Inpatient Hospital Stay
Admission: EM | Admit: 2024-05-10 | Discharge: 2024-05-14 | DRG: 191 | Disposition: A | Discharge status: Home / Self Care | Attending: Internal Medicine | Admitting: Internal Medicine

## 2024-05-10 ENCOUNTER — Encounter: Payer: Self-pay | Admitting: Emergency Medicine

## 2024-05-10 ENCOUNTER — Other Ambulatory Visit: Payer: Self-pay

## 2024-05-10 DIAGNOSIS — E876 Hypokalemia: Secondary | ICD-10-CM | POA: Diagnosis present

## 2024-05-10 DIAGNOSIS — E785 Hyperlipidemia, unspecified: Secondary | ICD-10-CM | POA: Diagnosis present

## 2024-05-10 DIAGNOSIS — J9811 Atelectasis: Secondary | ICD-10-CM | POA: Diagnosis present

## 2024-05-10 DIAGNOSIS — I959 Hypotension, unspecified: Secondary | ICD-10-CM | POA: Diagnosis present

## 2024-05-10 DIAGNOSIS — G629 Polyneuropathy, unspecified: Secondary | ICD-10-CM

## 2024-05-10 DIAGNOSIS — M25512 Pain in left shoulder: Secondary | ICD-10-CM | POA: Diagnosis present

## 2024-05-10 DIAGNOSIS — Y908 Blood alcohol level of 240 mg/100 ml or more: Secondary | ICD-10-CM | POA: Diagnosis present

## 2024-05-10 DIAGNOSIS — F1090 Alcohol use, unspecified, uncomplicated: Secondary | ICD-10-CM | POA: Diagnosis present

## 2024-05-10 DIAGNOSIS — E1140 Type 2 diabetes mellitus with diabetic neuropathy, unspecified: Secondary | ICD-10-CM | POA: Diagnosis present

## 2024-05-10 DIAGNOSIS — Z9981 Dependence on supplemental oxygen: Secondary | ICD-10-CM

## 2024-05-10 DIAGNOSIS — E66813 Obesity, class 3: Secondary | ICD-10-CM | POA: Diagnosis present

## 2024-05-10 DIAGNOSIS — R45850 Homicidal ideations: Secondary | ICD-10-CM | POA: Diagnosis present

## 2024-05-10 DIAGNOSIS — Z794 Long term (current) use of insulin: Secondary | ICD-10-CM

## 2024-05-10 DIAGNOSIS — J441 Chronic obstructive pulmonary disease with (acute) exacerbation: Secondary | ICD-10-CM | POA: Diagnosis not present

## 2024-05-10 DIAGNOSIS — Z5941 Food insecurity: Secondary | ICD-10-CM

## 2024-05-10 DIAGNOSIS — F10129 Alcohol abuse with intoxication, unspecified: Secondary | ICD-10-CM | POA: Diagnosis present

## 2024-05-10 DIAGNOSIS — E869 Volume depletion, unspecified: Secondary | ICD-10-CM | POA: Diagnosis present

## 2024-05-10 DIAGNOSIS — R45851 Suicidal ideations: Secondary | ICD-10-CM | POA: Diagnosis not present

## 2024-05-10 DIAGNOSIS — F332 Major depressive disorder, recurrent severe without psychotic features: Secondary | ICD-10-CM | POA: Diagnosis not present

## 2024-05-10 DIAGNOSIS — Z5986 Financial insecurity: Secondary | ICD-10-CM

## 2024-05-10 DIAGNOSIS — I2510 Atherosclerotic heart disease of native coronary artery without angina pectoris: Secondary | ICD-10-CM | POA: Diagnosis present

## 2024-05-10 DIAGNOSIS — Z6841 Body Mass Index (BMI) 40.0 and over, adult: Secondary | ICD-10-CM

## 2024-05-10 DIAGNOSIS — Z7984 Long term (current) use of oral hypoglycemic drugs: Secondary | ICD-10-CM

## 2024-05-10 DIAGNOSIS — Z79891 Long term (current) use of opiate analgesic: Secondary | ICD-10-CM

## 2024-05-10 DIAGNOSIS — Z820 Family history of epilepsy and other diseases of the nervous system: Secondary | ICD-10-CM

## 2024-05-10 DIAGNOSIS — Z9151 Personal history of suicidal behavior: Secondary | ICD-10-CM

## 2024-05-10 DIAGNOSIS — F17210 Nicotine dependence, cigarettes, uncomplicated: Secondary | ICD-10-CM | POA: Diagnosis present

## 2024-05-10 DIAGNOSIS — F419 Anxiety disorder, unspecified: Secondary | ICD-10-CM | POA: Diagnosis present

## 2024-05-10 DIAGNOSIS — J9611 Chronic respiratory failure with hypoxia: Secondary | ICD-10-CM | POA: Diagnosis present

## 2024-05-10 DIAGNOSIS — G8929 Other chronic pain: Secondary | ICD-10-CM | POA: Diagnosis present

## 2024-05-10 DIAGNOSIS — Z603 Acculturation difficulty: Secondary | ICD-10-CM | POA: Diagnosis present

## 2024-05-10 DIAGNOSIS — G894 Chronic pain syndrome: Secondary | ICD-10-CM | POA: Diagnosis present

## 2024-05-10 DIAGNOSIS — G4733 Obstructive sleep apnea (adult) (pediatric): Secondary | ICD-10-CM | POA: Diagnosis present

## 2024-05-10 DIAGNOSIS — M797 Fibromyalgia: Secondary | ICD-10-CM | POA: Diagnosis present

## 2024-05-10 DIAGNOSIS — I10 Essential (primary) hypertension: Secondary | ICD-10-CM | POA: Diagnosis present

## 2024-05-10 DIAGNOSIS — Z1152 Encounter for screening for COVID-19: Secondary | ICD-10-CM

## 2024-05-10 DIAGNOSIS — Z91414 Personal history of adult intimate partner abuse: Secondary | ICD-10-CM

## 2024-05-10 DIAGNOSIS — Z79899 Other long term (current) drug therapy: Secondary | ICD-10-CM

## 2024-05-10 DIAGNOSIS — Z5948 Other specified lack of adequate food: Secondary | ICD-10-CM

## 2024-05-10 DIAGNOSIS — F319 Bipolar disorder, unspecified: Secondary | ICD-10-CM | POA: Diagnosis present

## 2024-05-10 DIAGNOSIS — E11649 Type 2 diabetes mellitus with hypoglycemia without coma: Secondary | ICD-10-CM | POA: Diagnosis present

## 2024-05-10 DIAGNOSIS — F331 Major depressive disorder, recurrent, moderate: Secondary | ICD-10-CM | POA: Diagnosis present

## 2024-05-10 DIAGNOSIS — K219 Gastro-esophageal reflux disease without esophagitis: Secondary | ICD-10-CM | POA: Diagnosis present

## 2024-05-10 LAB — ETHANOL: Alcohol, Ethyl (B): 292 mg/dL — ABNORMAL HIGH (ref <15)

## 2024-05-10 LAB — CBC
HCT: 45.9 % (ref 36.0–46.0)
Hemoglobin: 15.4 g/dL — ABNORMAL HIGH (ref 12.0–15.0)
MCH: 29.8 pg (ref 26.0–34.0)
MCHC: 33.6 g/dL (ref 30.0–36.0)
MCV: 89 fL (ref 80.0–100.0)
Platelets: 268 10*3/uL (ref 150–400)
RBC: 5.16 MIL/uL — ABNORMAL HIGH (ref 3.87–5.11)
RDW: 13.8 % (ref 11.5–15.5)
WBC: 10.8 10*3/uL — ABNORMAL HIGH (ref 4.0–10.5)
nRBC: 0 % (ref 0.0–0.2)

## 2024-05-10 LAB — URINE DRUG SCREEN, QUALITATIVE (ARMC ONLY)
Amphetamines, Ur Screen: NOT DETECTED
Barbiturates, Ur Screen: NOT DETECTED
Benzodiazepine, Ur Scrn: NOT DETECTED
Cannabinoid 50 Ng, Ur ~~LOC~~: NOT DETECTED
Cocaine Metabolite,Ur ~~LOC~~: NOT DETECTED
MDMA (Ecstasy)Ur Screen: NOT DETECTED
Methadone Scn, Ur: NOT DETECTED
Opiate, Ur Screen: NOT DETECTED
Phencyclidine (PCP) Ur S: NOT DETECTED
Tricyclic, Ur Screen: NOT DETECTED

## 2024-05-10 LAB — COMPREHENSIVE METABOLIC PANEL WITH GFR
ALT: 32 U/L (ref 0–44)
AST: 57 U/L — ABNORMAL HIGH (ref 15–41)
Albumin: 3.9 g/dL (ref 3.5–5.0)
Alkaline Phosphatase: 108 U/L (ref 38–126)
Anion gap: 13 (ref 5–15)
BUN: 8 mg/dL (ref 8–23)
CO2: 25 mmol/L (ref 22–32)
Calcium: 9.1 mg/dL (ref 8.9–10.3)
Chloride: 101 mmol/L (ref 98–111)
Creatinine, Ser: 0.71 mg/dL (ref 0.44–1.00)
GFR, Estimated: >60 mL/min (ref >60)
Glucose, Bld: 156 mg/dL — ABNORMAL HIGH (ref 70–99)
Potassium: 2.7 mmol/L — CL (ref 3.5–5.1)
Sodium: 139 mmol/L (ref 135–145)
Total Bilirubin: 0.8 mg/dL (ref 0.0–1.2)
Total Protein: 8.4 g/dL — ABNORMAL HIGH (ref 6.5–8.1)

## 2024-05-10 LAB — URINALYSIS, ROUTINE W REFLEX MICROSCOPIC
Bacteria, UA: NONE SEEN
Bilirubin Urine: NEGATIVE
Glucose, UA: NEGATIVE mg/dL
Ketones, ur: NEGATIVE mg/dL
Leukocytes,Ua: NEGATIVE
Nitrite: NEGATIVE
Protein, ur: NEGATIVE mg/dL
RBC / HPF: 0 RBC/hpf (ref 0–5)
Specific Gravity, Urine: 1.002 — ABNORMAL LOW (ref 1.005–1.030)
Squamous Epithelial / HPF: 0 /HPF (ref 0–5)
pH: 7 (ref 5.0–8.0)

## 2024-05-10 LAB — TROPONIN I (HIGH SENSITIVITY): Troponin I (High Sensitivity): 8 ng/L (ref <18)

## 2024-05-10 LAB — RESP PANEL BY RT-PCR (RSV, FLU A&B, COVID)  RVPGX2
Influenza A by PCR: NEGATIVE
Influenza B by PCR: NEGATIVE
Resp Syncytial Virus by PCR: NEGATIVE
SARS Coronavirus 2 by RT PCR: NEGATIVE

## 2024-05-10 LAB — MAGNESIUM: Magnesium: 2.3 mg/dL (ref 1.7–2.4)

## 2024-05-10 MED ORDER — POTASSIUM CHLORIDE 10 MEQ/100ML IV SOLN
10.0000 meq | Freq: Once | INTRAVENOUS | Status: AC
  Administered 2024-05-10: 10 meq via INTRAVENOUS
  Filled 2024-05-10: qty 100

## 2024-05-10 MED ORDER — POTASSIUM CHLORIDE 20 MEQ PO PACK
40.0000 meq | PACK | Freq: Once | ORAL | Status: AC
  Administered 2024-05-11: 40 meq via ORAL
  Filled 2024-05-10: qty 2

## 2024-05-10 MED ORDER — HALOPERIDOL LACTATE 5 MG/ML IJ SOLN: 5.0000 mg | Freq: Once | INTRAMUSCULAR | Status: DC

## 2024-05-10 MED ORDER — LORAZEPAM 2 MG/ML IJ SOLN
2.0000 mg | Freq: Once | INTRAMUSCULAR | Status: AC
  Administered 2024-05-10: 2 mg via INTRAMUSCULAR
  Filled 2024-05-10: qty 1

## 2024-05-10 MED ORDER — POTASSIUM CHLORIDE CRYS ER 20 MEQ PO TBCR: 40.0000 meq | EXTENDED_RELEASE_TABLET | Freq: Once | ORAL | Status: DC

## 2024-05-10 MED ORDER — SODIUM CHLORIDE 0.9 % IV BOLUS
500.0000 mL | Freq: Once | INTRAVENOUS | Status: AC
  Administered 2024-05-10: 500 mL via INTRAVENOUS

## 2024-05-10 MED ORDER — TRAZODONE HCL 100 MG PO TABS
200.0000 mg | ORAL_TABLET | Freq: Every day | ORAL | Status: DC
  Administered 2024-05-11: 200 mg via ORAL
  Filled 2024-05-10: qty 2

## 2024-05-10 NOTE — ED Notes (Signed)
 Pt awake in be bed.

## 2024-05-10 NOTE — ED Notes (Signed)
 ED Provider at bedside.

## 2024-05-10 NOTE — ED Notes (Signed)
 Siadecki MD aware potassium 2.7

## 2024-05-10 NOTE — ED Notes (Signed)
Pt in bed sleeping

## 2024-05-10 NOTE — ED Notes (Signed)
 Psych team at bedside for consult via video chat.
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
## 2024-05-10 NOTE — ED Notes (Signed)
 Dress, undergarments, 1 pair of shoes, necklace and one large black bag removed from patient and placed in belongings bag. Pt dressed in safety scrubs.

## 2024-05-10 NOTE — ED Notes (Signed)
 Lab called and will run urinalysis.

## 2024-05-10 NOTE — ED Triage Notes (Signed)
 Pt arrives seeking treatment for alcohol. Pt states she has been drinking for 2 weeks straight and no longer wants to be alive, denies attempt but admits to having pills as her plan. Last drink 2 hours pta.

## 2024-05-10 NOTE — ED Notes (Signed)
Pt awake in bed watching tv.

## 2024-05-10 NOTE — ED Provider Notes (Signed)
 [facility] Provider Note    Event Date/Time   First MD Initiated Contact with Patient 05/10/24 2008     (approximate)   History   Alcohol Problem and Suicidal   HPI  Kelly Dillon is a 63 y.o. female with a history of asthma, COPD on 3 L home O2, interstitial lung disease, OSA, type 2 diabetes, chronic pain, chronic opioid use, CAD, hypertension, anxiety, and depression who presents with suicidal ideation and alcohol abuse.  The patient states that she has been drinking heavily for the last few weeks, up to a full bottle of tequila daily.  She reports that she has active suicidal thoughts.  She reports that her daughter is currently in jail.  She is upset at her boyfriend and states that she is having homicidal ideation towards her boyfriend but also wanting to kill herself.  She states that she has considered overdosing on her medications, or cutting herself in the jugular vein.  She reports chronic medical problems including COPD, but denies any acute physical symptoms.  I reviewed the past medical records.  The patient was admitted to the hospitalist service in January with acute on chronic respiratory failure due to pneumonia and COPD exacerbation.  Physical Exam   Triage Vital Signs: ED Triage Vitals  Encounter Vitals Group     BP 05/10/24 1931 116/83     Girls Systolic BP Percentile --      Girls Diastolic BP Percentile --      Boys Systolic BP Percentile --      Boys Diastolic BP Percentile --      Pulse Rate 05/10/24 1931 (!) 148     Resp 05/10/24 1931 20     Temp 05/10/24 1931 99.3 F (37.4 C)     Temp Source 05/10/24 1931 Oral     SpO2 05/10/24 1931 95 %     Weight 05/10/24 1931 253 lb 8.5 oz (115 kg)     Height 05/10/24 1931 4' 7 (1.397 m)     Head Circumference --      Peak Flow --      Pain Score 05/10/24 1941 0     Pain Loc --      Pain Education --      Exclude from Growth Chart --     Most recent vital signs: Vitals:    05/10/24 2040 05/10/24 2240  BP: 122/83 106/79  Pulse: (!) 123 (!) 110  Resp: 20 18  Temp:    SpO2: 95% 97%     General: Awake, comfortable appearing, no distress.  CV:  Good peripheral perfusion.  Resp:  Normal effort.  Abd:  No distention.  Other:  Labile but redirectable.  Motor intact in all extremities.  Normal speech.   ED Results / Procedures / Treatments   Labs (all labs ordered are listed, but only abnormal results are displayed) Labs Reviewed  COMPREHENSIVE METABOLIC PANEL WITH GFR - Abnormal; Notable for the following components:      Result Value   Potassium 2.7 (*)    Glucose, Bld 156 (*)    Total Protein 8.4 (*)    AST 57 (*)    All other components within normal limits  ETHANOL - Abnormal; Notable for the following components:   Alcohol, Ethyl (B) 292 (*)    All other components within normal limits  CBC - Abnormal; Notable for the following components:   WBC 10.8 (*)    RBC 5.16 (*)  Hemoglobin 15.4 (*)    All other components within normal limits  URINALYSIS, ROUTINE W REFLEX MICROSCOPIC - Abnormal; Notable for the following components:   Color, Urine STRAW (*)    APPearance CLEAR (*)    Specific Gravity, Urine 1.002 (*)    Hgb urine dipstick SMALL (*)    All other components within normal limits  RESP PANEL BY RT-PCR (RSV, FLU A&B, COVID)  [id number]  URINE DRUG SCREEN, QUALITATIVE ([facility] ONLY)  MAGNESIUM   TROPONIN I (HIGH SENSITIVITY)     EKG  ED ECG REPORT I, Lind Repine, the attending physician, personally viewed and interpreted this ECG.  Date: 05/10/2024 EKG Time: 1947 Rate: 142 Rhythm: Narrow complex tachycardia QRS Axis: normal Intervals: normal ST/T Wave abnormalities: Nonspecific ST abnormalities Narrative Interpretation: no evidence of acute ischemia    RADIOLOGY    PROCEDURES:  Critical Care performed: No  Procedures   MEDICATIONS ORDERED IN ED: Medications  potassium chloride  10 mEq in 100 mL IVPB (10 mEq  Intravenous New Bag/Given 05/10/24 2332)  potassium chloride  (KLOR-CON ) packet 40 mEq (has no administration in time range)  traZODone  (DESYREL ) tablet 200 mg (has no administration in time range)  potassium chloride  10 mEq in 100 mL IVPB (0 mEq Intravenous Stopped 05/10/24 2331)  LORazepam  (ATIVAN ) injection 2 mg (2 mg Intramuscular Given 05/10/24 2048)  sodium chloride  0.9 % bolus 500 mL (0 mLs Intravenous Stopped 05/10/24 2331)     IMPRESSION / MDM / ASSESSMENT AND PLAN / ED COURSE  I reviewed the triage vital signs and the nursing notes.  63 year old female with PMH as noted above presents with suicidal ideation and also reports very heavy alcohol use over the last 2 weeks.  On exam, the patient is significantly tachycardic, although was somewhat agitated at triage per the RN report.  Her temperature is borderline elevated.  Other vital signs are normal.  Neurologic exam is nonfocal.  The patient appears mildly intoxicated but is able to answer questions appropriately.  She is somewhat emotionally labile but redirectable.  Differential diagnosis includes, but is not limited to:  Suicidal ideation: Major depressive disorder, adjustment disorder, substance-induced mood disorder, alcohol or polysubstance abuse.  Tachycardia: Acute anxiety, dehydration, alcohol intoxication, alcoholic ketoacidosis, other metabolic disturbance, less likely acute infection.  We will obtain lab workup, urinalysis, ethanol level, respiratory panel to evaluate for flu and COVID, and reassess.  I have ordered psychiatry and TTS consults.  The patient has been placed under involuntary commitment.  Patient's presentation is most consistent with acute complicated illness / injury requiring diagnostic workup.  The patient is on the cardiac monitor to evaluate for evidence of arrhythmia and/or significant heart rate changes.   The patient has been placed in psychiatric observation due to the need to provide a safe  environment for the patient while obtaining psychiatric consultation and evaluation, as well as ongoing medical and medication management to treat the patient's condition.  The patient has been placed under full IVC at this time.   ----------------------------------------- 8:50 PM on 05/10/2024 -----------------------------------------  The patient became acutely agitated, yelling at staff, ran out of the room, and down the hallway.  After few minutes, she became somewhat calmer and agreed to get into a wheelchair and be wheeled back to the room.  However, due to the increased agitation she requires a therapeutic dose of Ativan .  This will also facilitate the medical workup.  ----------------------------------------- 11:44 PM on 05/10/2024 -----------------------------------------  The patient is now calm and cooperative.  Her heart  rate has significantly improved.  Lab workup is significant for hypokalemia.  I have ordered IV and p.o. repletion.  Troponin is negative.  Urinalysis shows no acute findings.  Respiratory panel is negative.  Ethanol level is 292 but UDS is negative.  Once the potassium is repleted, the patient will be medically cleared.  I consulted and discussed the case with the psychiatry provider who recommends inpatient psychiatric admission once medically cleared.   FINAL CLINICAL IMPRESSION(S) / ED DIAGNOSES   Final diagnoses:  Suicidal ideation  Alcohol use disorder  Hypokalemia     Rx / DC Orders   ED Discharge Orders     None        Note:  This document was prepared using Dragon voice recognition software and may include unintentional dictation errors.    Lind Repine, MD 05/10/24 [phone]
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
## 2024-05-11 ENCOUNTER — Emergency Department

## 2024-05-11 DIAGNOSIS — F17210 Nicotine dependence, cigarettes, uncomplicated: Secondary | ICD-10-CM | POA: Diagnosis present

## 2024-05-11 DIAGNOSIS — R45851 Suicidal ideations: Secondary | ICD-10-CM | POA: Diagnosis present

## 2024-05-11 DIAGNOSIS — F1090 Alcohol use, unspecified, uncomplicated: Secondary | ICD-10-CM

## 2024-05-11 DIAGNOSIS — I10 Essential (primary) hypertension: Secondary | ICD-10-CM

## 2024-05-11 DIAGNOSIS — K219 Gastro-esophageal reflux disease without esophagitis: Secondary | ICD-10-CM

## 2024-05-11 DIAGNOSIS — J441 Chronic obstructive pulmonary disease with (acute) exacerbation: Secondary | ICD-10-CM | POA: Diagnosis present

## 2024-05-11 DIAGNOSIS — Y908 Blood alcohol level of 240 mg/100 ml or more: Secondary | ICD-10-CM | POA: Diagnosis present

## 2024-05-11 DIAGNOSIS — G894 Chronic pain syndrome: Secondary | ICD-10-CM | POA: Diagnosis present

## 2024-05-11 DIAGNOSIS — F3162 Bipolar disorder, current episode mixed, moderate: Secondary | ICD-10-CM

## 2024-05-11 DIAGNOSIS — F419 Anxiety disorder, unspecified: Secondary | ICD-10-CM | POA: Diagnosis present

## 2024-05-11 DIAGNOSIS — E66813 Obesity, class 3: Secondary | ICD-10-CM | POA: Diagnosis present

## 2024-05-11 DIAGNOSIS — F10129 Alcohol abuse with intoxication, unspecified: Secondary | ICD-10-CM | POA: Diagnosis present

## 2024-05-11 DIAGNOSIS — J9811 Atelectasis: Secondary | ICD-10-CM | POA: Diagnosis present

## 2024-05-11 DIAGNOSIS — F319 Bipolar disorder, unspecified: Secondary | ICD-10-CM | POA: Diagnosis present

## 2024-05-11 DIAGNOSIS — E11649 Type 2 diabetes mellitus with hypoglycemia without coma: Secondary | ICD-10-CM | POA: Diagnosis present

## 2024-05-11 DIAGNOSIS — E1140 Type 2 diabetes mellitus with diabetic neuropathy, unspecified: Secondary | ICD-10-CM | POA: Diagnosis present

## 2024-05-11 DIAGNOSIS — Z794 Long term (current) use of insulin: Secondary | ICD-10-CM | POA: Diagnosis not present

## 2024-05-11 DIAGNOSIS — E876 Hypokalemia: Secondary | ICD-10-CM

## 2024-05-11 DIAGNOSIS — R45850 Homicidal ideations: Secondary | ICD-10-CM | POA: Diagnosis present

## 2024-05-11 DIAGNOSIS — E785 Hyperlipidemia, unspecified: Secondary | ICD-10-CM | POA: Diagnosis present

## 2024-05-11 DIAGNOSIS — Z1152 Encounter for screening for COVID-19: Secondary | ICD-10-CM | POA: Diagnosis not present

## 2024-05-11 DIAGNOSIS — Z6841 Body Mass Index (BMI) 40.0 and over, adult: Secondary | ICD-10-CM | POA: Diagnosis not present

## 2024-05-11 DIAGNOSIS — I2510 Atherosclerotic heart disease of native coronary artery without angina pectoris: Secondary | ICD-10-CM | POA: Diagnosis present

## 2024-05-11 DIAGNOSIS — M797 Fibromyalgia: Secondary | ICD-10-CM | POA: Diagnosis present

## 2024-05-11 DIAGNOSIS — J9611 Chronic respiratory failure with hypoxia: Secondary | ICD-10-CM | POA: Diagnosis present

## 2024-05-11 DIAGNOSIS — Z9981 Dependence on supplemental oxygen: Secondary | ICD-10-CM | POA: Diagnosis not present

## 2024-05-11 DIAGNOSIS — Z7984 Long term (current) use of oral hypoglycemic drugs: Secondary | ICD-10-CM | POA: Diagnosis not present

## 2024-05-11 LAB — GLUCOSE, CAPILLARY
Glucose-Capillary: 173 mg/dL — ABNORMAL HIGH (ref 70–99)
Glucose-Capillary: 268 mg/dL — ABNORMAL HIGH (ref 70–99)
Glucose-Capillary: 321 mg/dL — ABNORMAL HIGH (ref 70–99)
Glucose-Capillary: 256 mg/dL — ABNORMAL HIGH (ref 70–99)

## 2024-05-11 LAB — BASIC METABOLIC PANEL WITH GFR
Anion gap: 9 (ref 5–15)
BUN: 9 mg/dL (ref 8–23)
CO2: 22 mmol/L (ref 22–32)
Calcium: 7.8 mg/dL — ABNORMAL LOW (ref 8.9–10.3)
Chloride: 106 mmol/L (ref 98–111)
Creatinine, Ser: 0.72 mg/dL (ref 0.44–1.00)
GFR, Estimated: >60 mL/min (ref >60)
Glucose, Bld: 311 mg/dL — ABNORMAL HIGH (ref 70–99)
Potassium: 3.7 mmol/L (ref 3.5–5.1)
Sodium: 137 mmol/L (ref 135–145)

## 2024-05-11 LAB — CBC
HCT: 36.7 % (ref 36.0–46.0)
Hemoglobin: 12.3 g/dL (ref 12.0–15.0)
MCH: 30.4 pg (ref 26.0–34.0)
MCHC: 33.5 g/dL (ref 30.0–36.0)
MCV: 90.6 fL (ref 80.0–100.0)
Platelets: 174 10*3/uL (ref 150–400)
RBC: 4.05 MIL/uL (ref 3.87–5.11)
RDW: 14.2 % (ref 11.5–15.5)
WBC: 3.1 10*3/uL — ABNORMAL LOW (ref 4.0–10.5)
nRBC: 0 % (ref 0.0–0.2)

## 2024-05-11 LAB — HEMOGLOBIN A1C
Hgb A1c MFr Bld: 6 % — ABNORMAL HIGH (ref 4.8–5.6)
Mean Plasma Glucose: 125.5 mg/dL

## 2024-05-11 MED ORDER — ACETAMINOPHEN 325 MG PO TABS
650.0000 mg | ORAL_TABLET | Freq: Four times a day (QID) | ORAL | Status: DC | PRN
Start: 2024-05-11 — End: 2024-05-14

## 2024-05-11 MED ORDER — IPRATROPIUM-ALBUTEROL 0.5-2.5 (3) MG/3ML IN SOLN
3.0000 mL | Freq: Three times a day (TID) | RESPIRATORY_TRACT | Status: DC
  Administered 2024-05-12: 3 mL via RESPIRATORY_TRACT
  Filled 2024-05-11: qty 3

## 2024-05-11 MED ORDER — INSULIN ASPART 100 UNIT/ML IJ SOLN
0.0000 [IU] | Freq: Every day | INTRAMUSCULAR | Status: DC
  Administered 2024-05-11: 3 [IU] via SUBCUTANEOUS
  Administered 2024-05-12: 2 [IU] via SUBCUTANEOUS
  Administered 2024-05-13: 3 [IU] via SUBCUTANEOUS
  Filled 2024-05-11 (×3): qty 1

## 2024-05-11 MED ORDER — HYDROCODONE-ACETAMINOPHEN 5-325 MG PO TABS
1.0000 | ORAL_TABLET | ORAL | Status: DC | PRN
  Administered 2024-05-11 – 2024-05-12 (×3): 2 via ORAL
  Administered 2024-05-13: 1 via ORAL
  Administered 2024-05-13: 2 via ORAL
  Administered 2024-05-14 (×2): 1 via ORAL
  Filled 2024-05-11: qty 1
  Filled 2024-05-11 (×3): qty 2
  Filled 2024-05-11 (×2): qty 1
  Filled 2024-05-11: qty 2

## 2024-05-11 MED ORDER — IPRATROPIUM-ALBUTEROL 0.5-2.5 (3) MG/3ML IN SOLN
3.0000 mL | Freq: Four times a day (QID) | RESPIRATORY_TRACT | Status: DC
Start: 2024-05-11 — End: 2024-05-11
  Administered 2024-05-11 (×3): 3 mL via RESPIRATORY_TRACT
  Filled 2024-05-11 (×3): qty 3

## 2024-05-11 MED ORDER — ARIPIPRAZOLE 2 MG PO TABS
10.0000 mg | ORAL_TABLET | Freq: Every day | ORAL | Status: DC
  Administered 2024-05-12: 10 mg via ORAL
  Filled 2024-05-11: qty 5

## 2024-05-11 MED ORDER — ONDANSETRON HCL 4 MG PO TABS
4.0000 mg | ORAL_TABLET | Freq: Four times a day (QID) | ORAL | Status: DC | PRN
Start: 2024-05-11 — End: 2024-05-14

## 2024-05-11 MED ORDER — NICOTINE 14 MG/24HR TD PT24
14.0000 mg | MEDICATED_PATCH | Freq: Every day | TRANSDERMAL | Status: DC
  Administered 2024-05-11 – 2024-05-14 (×4): 14 mg via TRANSDERMAL
  Filled 2024-05-11 (×4): qty 1

## 2024-05-11 MED ORDER — INSULIN GLARGINE-YFGN 100 UNIT/ML ~~LOC~~ SOLN
12.0000 [IU] | Freq: Every day | SUBCUTANEOUS | Status: DC
  Administered 2024-05-11 – 2024-05-13 (×3): 12 [IU] via SUBCUTANEOUS
  Filled 2024-05-11 (×4): qty 0.12

## 2024-05-11 MED ORDER — FOLIC ACID 1 MG PO TABS
1.0000 mg | ORAL_TABLET | Freq: Every day | ORAL | Status: DC
  Administered 2024-05-11 – 2024-05-14 (×4): 1 mg via ORAL
  Filled 2024-05-11 (×4): qty 1

## 2024-05-11 MED ORDER — VENLAFAXINE HCL ER 150 MG PO CP24
300.0000 mg | ORAL_CAPSULE | Freq: Every day | ORAL | Status: DC
  Administered 2024-05-11: 300 mg via ORAL
  Filled 2024-05-11 (×2): qty 2

## 2024-05-11 MED ORDER — SODIUM CHLORIDE 0.9 % IV SOLN
1.0000 g | INTRAVENOUS | Status: DC
  Administered 2024-05-11 – 2024-05-13 (×3): 1 g via INTRAVENOUS
  Filled 2024-05-11 (×4): qty 10

## 2024-05-11 MED ORDER — SODIUM CHLORIDE 0.9 % IV BOLUS (SEPSIS)
500.0000 mL | Freq: Once | INTRAVENOUS | Status: AC
  Administered 2024-05-11: 500 mL via INTRAVENOUS

## 2024-05-11 MED ORDER — METHYLPREDNISOLONE SODIUM SUCC 125 MG IJ SOLR
125.0000 mg | Freq: Once | INTRAMUSCULAR | Status: AC
  Administered 2024-05-11: 125 mg via INTRAVENOUS
  Filled 2024-05-11: qty 2

## 2024-05-11 MED ORDER — ARIPIPRAZOLE 2 MG PO TABS
5.0000 mg | ORAL_TABLET | Freq: Once | ORAL | Status: AC
  Administered 2024-05-11: 5 mg via ORAL
  Filled 2024-05-11: qty 3

## 2024-05-11 MED ORDER — INSULIN ASPART 100 UNIT/ML IJ SOLN
0.0000 [IU] | Freq: Three times a day (TID) | INTRAMUSCULAR | Status: DC
  Administered 2024-05-11: 8 [IU] via SUBCUTANEOUS
  Administered 2024-05-11: 5 [IU] via SUBCUTANEOUS
  Administered 2024-05-12: 8 [IU] via SUBCUTANEOUS
  Administered 2024-05-12 (×2): 3 [IU] via SUBCUTANEOUS
  Administered 2024-05-13: 11 [IU] via SUBCUTANEOUS
  Administered 2024-05-13 – 2024-05-14 (×2): 3 [IU] via SUBCUTANEOUS
  Filled 2024-05-11 (×9): qty 1

## 2024-05-11 MED ORDER — ADULT MULTIVITAMIN W/MINERALS CH
1.0000 | ORAL_TABLET | Freq: Every day | ORAL | Status: DC
  Administered 2024-05-11 – 2024-05-14 (×4): 1 via ORAL
  Filled 2024-05-11 (×4): qty 1

## 2024-05-11 MED ORDER — SODIUM CHLORIDE 0.9 % IV BOLUS (SEPSIS)
1000.0000 mL | Freq: Once | INTRAVENOUS | Status: AC
  Administered 2024-05-11: 1000 mL via INTRAVENOUS

## 2024-05-11 MED ORDER — LORAZEPAM 2 MG/ML IJ SOLN: 1.0000 mg | INTRAMUSCULAR | Status: DC | PRN

## 2024-05-11 MED ORDER — ACETAMINOPHEN 650 MG RE SUPP: 650.0000 mg | Freq: Four times a day (QID) | RECTAL | Status: DC | PRN

## 2024-05-11 MED ORDER — POTASSIUM CHLORIDE 10 MEQ/100ML IV SOLN
10.0000 meq | INTRAVENOUS | Status: AC
  Administered 2024-05-11 (×2): 10 meq via INTRAVENOUS
  Filled 2024-05-11 (×2): qty 100

## 2024-05-11 MED ORDER — ATORVASTATIN CALCIUM 20 MG PO TABS
40.0000 mg | ORAL_TABLET | Freq: Every day | ORAL | Status: DC
  Administered 2024-05-11 – 2024-05-14 (×4): 40 mg via ORAL
  Filled 2024-05-11 (×4): qty 2

## 2024-05-11 MED ORDER — ALBUTEROL SULFATE (2.5 MG/3ML) 0.083% IN NEBU
2.5000 mg | INHALATION_SOLUTION | RESPIRATORY_TRACT | Status: DC | PRN
Start: 2024-05-11 — End: 2024-05-14

## 2024-05-11 MED ORDER — THIAMINE MONONITRATE 100 MG PO TABS
100.0000 mg | ORAL_TABLET | Freq: Every day | ORAL | Status: DC
  Administered 2024-05-11 – 2024-05-14 (×4): 100 mg via ORAL
  Filled 2024-05-11 (×4): qty 1

## 2024-05-11 MED ORDER — GUAIFENESIN ER 600 MG PO TB12
600.0000 mg | ORAL_TABLET | Freq: Two times a day (BID) | ORAL | Status: DC
  Administered 2024-05-11 – 2024-05-14 (×7): 600 mg via ORAL
  Filled 2024-05-11 (×7): qty 1

## 2024-05-11 MED ORDER — PREDNISONE 20 MG PO TABS
40.0000 mg | ORAL_TABLET | Freq: Every day | ORAL | Status: DC
Start: 2024-05-11 — End: 2024-05-16
  Administered 2024-05-11 – 2024-05-12 (×2): 40 mg via ORAL
  Filled 2024-05-11 (×2): qty 2

## 2024-05-11 MED ORDER — ENOXAPARIN SODIUM 60 MG/0.6ML IJ SOSY
0.5000 mg/kg | PREFILLED_SYRINGE | INTRAMUSCULAR | Status: DC
  Administered 2024-05-11 – 2024-05-12 (×2): 57.5 mg via SUBCUTANEOUS
  Filled 2024-05-11 (×2): qty 0.6

## 2024-05-11 MED ORDER — NICOTINE POLACRILEX 2 MG MT GUM
2.0000 mg | CHEWING_GUM | OROMUCOSAL | Status: DC | PRN
  Administered 2024-05-11: 2 mg via ORAL
  Filled 2024-05-11: qty 1

## 2024-05-11 MED ORDER — THIAMINE HCL 100 MG/ML IJ SOLN: 100.0000 mg | Freq: Every day | INTRAMUSCULAR | Status: DC

## 2024-05-11 MED ORDER — LITHIUM CARBONATE ER 300 MG PO TBCR
300.0000 mg | EXTENDED_RELEASE_TABLET | Freq: Two times a day (BID) | ORAL | Status: DC
  Administered 2024-05-11 – 2024-05-14 (×7): 300 mg via ORAL
  Filled 2024-05-11 (×7): qty 1

## 2024-05-11 MED ORDER — IPRATROPIUM-ALBUTEROL 0.5-2.5 (3) MG/3ML IN SOLN
3.0000 mL | RESPIRATORY_TRACT | Status: AC
  Administered 2024-05-11 (×3): 3 mL via RESPIRATORY_TRACT
  Filled 2024-05-11: qty 3
  Filled 2024-05-11: qty 6

## 2024-05-11 MED ORDER — ONDANSETRON HCL 4 MG/2ML IJ SOLN
4.0000 mg | Freq: Four times a day (QID) | INTRAMUSCULAR | Status: DC | PRN
  Administered 2024-05-13: 4 mg via INTRAVENOUS
  Filled 2024-05-11: qty 2

## 2024-05-11 MED ORDER — OXYCODONE-ACETAMINOPHEN 7.5-325 MG PO TABS
1.0000 | ORAL_TABLET | Freq: Four times a day (QID) | ORAL | Status: DC | PRN
  Administered 2024-05-11 – 2024-05-12 (×3): 1 via ORAL
  Filled 2024-05-11 (×3): qty 1

## 2024-05-11 MED ORDER — SODIUM CHLORIDE 0.9 % IV SOLN
500.0000 mg | INTRAVENOUS | Status: DC
  Administered 2024-05-11 – 2024-05-13 (×3): 500 mg via INTRAVENOUS
  Filled 2024-05-11 (×3): qty 5

## 2024-05-11 MED ORDER — PANTOPRAZOLE SODIUM 40 MG PO TBEC
40.0000 mg | DELAYED_RELEASE_TABLET | Freq: Every day | ORAL | Status: DC
  Administered 2024-05-11 – 2024-05-14 (×4): 40 mg via ORAL
  Filled 2024-05-11 (×4): qty 1

## 2024-05-11 MED ORDER — ARIPIPRAZOLE 2 MG PO TABS
2.0000 mg | ORAL_TABLET | Freq: Every day | ORAL | Status: DC
  Administered 2024-05-11: 2 mg via ORAL
  Filled 2024-05-11 (×2): qty 1

## 2024-05-11 MED ORDER — PREGABALIN 75 MG PO CAPS
200.0000 mg | ORAL_CAPSULE | Freq: Three times a day (TID) | ORAL | Status: DC
  Administered 2024-05-11 – 2024-05-14 (×10): 200 mg via ORAL
  Filled 2024-05-11 (×10): qty 1

## 2024-05-11 MED ORDER — LOSARTAN POTASSIUM 50 MG PO TABS
50.0000 mg | ORAL_TABLET | Freq: Every day | ORAL | Status: DC
  Administered 2024-05-11 – 2024-05-14 (×4): 50 mg via ORAL
  Filled 2024-05-11 (×4): qty 1

## 2024-05-11 MED ORDER — LORAZEPAM 1 MG PO TABS: 1.0000 mg | ORAL_TABLET | ORAL | Status: DC | PRN

## 2024-05-11 NOTE — Plan of Care (Signed)

## 2024-05-11 NOTE — H&P (Addendum)
 History and Physical    Kelly Dillon FMW:[id number] DOB: 04-15-1961 DOA: 05/10/2024  DOS: the patient was seen and examined on 05/10/2024  PCP: Orlean Alan HERO, FNP   Patient coming from: Home  I have personally briefly reviewed patient's old medical records in [place] [facility]  HPI:  No notes on file   Kelly Dillon is a 63 y.o. year old female with past medical history of treatment resistant asthma, tobacco use disorder, ILD, COPD with chronic hypoxic respiratory failure on 3 L O2 at home, obstructive sleep apnea's, type 2 diabetes mellitus, chronic pain, anxiety, depression with suicide attempt in 2013, fibromyalgia, diverticulitis, coronary artery disease, hypertension.  She presents to [facility] ED with suicidal ideation and alcohol abuse.  She was initially awaiting psychiatric consultation however during reassessment by ED physician it was noted that patient was wheezing and had mildly increased work of breathing with associated dyspnea.  On my interview she endorses productive cough with green sputum and increased dyspnea that started Thursday evening.  She denies fever, chest pain, fatigue, nausea, vomiting or diarrhea.  As far as her psychiatric she endorses thoughts of wanting to harm herself as well as her daughter's boyfriend citing he had something to do with her daughter being sent to jail.  She states that she thinks about harming him at baseline but when she drinks there is a voice in her head that gives her the encouraged to want to do something about it and the voice tells her to go after him when she sees him.  She reports she has not acted on this.  She reports until recently she had been sober from alcohol for 20 years but had a recent relapse and is now drinking 1 gallon of tequila every weekend over 3-day period.  She endorses visual hallucinations at baseline that is worsened when she drinks, often seeing bugs crawling on the ground.  She  also endorses a feeling or that someone is watching her.  Lastly patient endorses being psychologically distressed by the current climate of immigrants being detained and deported.  [facility] in person Spanish interpreter utilized for translation during this interview.  ED Course: On arrival to [facility] regional ED patient was noted to be afebrile temp 37.4 C, 116/83, HR 148, RR 20, SpO2 97 % on 2 L via nasal cannula.  CXR obtained and showed mild basilar atelectasis.  Labs notable for potassium 2.7, glucose 156, AST 57, blood alcohol 292, WBC 10.8, hemoglobin 15.4, and negative COVID, flu, RSV.  She was given 2 L NS bolus, 40 mEq p.o. K and 20 mEq IV K, IV Solu-Medrol , IV Ativan , and DuoNebs.  EDP placed consult to psychiatry and [facility] contacted for admission.  Review of Systems: As mentioned in the history of present illness. All other systems reviewed and are negative.    Past Medical History:  Diagnosis Date   Acute on chronic respiratory failure with hypoxia (HCC) 08/27/2021   Chronic pain syndrome    extensive - see problem list   Community acquired pneumonia of right lower lobe of lung 08/27/2021   COPD (chronic obstructive pulmonary disease) (HCC)    Diabetes mellitus without complication (HCC)    Fibromyalgia    went to [facility] for monthly lidocaine  infusions   Hypertension    Sepsis (HCC) 08/26/2021    Past Surgical History:  Procedure Laterality Date   APPENDECTOMY     COLONOSCOPY     COLONOSCOPY WITH PROPOFOL  N/A 03/01/2022  Procedure: COLONOSCOPY WITH PROPOFOL ;  Surgeon: Therisa Bi, MD;  Location: [facility] ENDOSCOPY;  Service: Gastroenterology;  Laterality: N/A;   ESOPHAGOGASTRODUODENOSCOPY     ESOPHAGOGASTRODUODENOSCOPY N/A 03/01/2022   Procedure: ESOPHAGOGASTRODUODENOSCOPY (EGD);  Surgeon: Therisa Bi, MD;  Location: [facility] ENDOSCOPY;  Service: Gastroenterology;  Laterality: N/A;   HERNIA REPAIR     TUBAL LIGATION     VENTRAL HERNIA REPAIR N/A 08/15/2018   Procedure:  HERNIA REPAIR VENTRAL ADULT;  Surgeon: Tye Millet, DO;  Location: [facility] ORS;  Service: General;  Laterality: N/A;   VENTRAL HERNIA REPAIR N/A 08/18/2018   Procedure: LAPAROSCOPIC VENTRAL HERNIA;  Surgeon: Tye Millet, DO;  Location: [facility] ORS;  Service: General;  Laterality: N/A;     reports that she has been smoking cigarettes. She has never used smokeless tobacco. She reports current alcohol use. She reports current drug use.  No Known Allergies  History reviewed. No pertinent family history.  Prior to Admission medications   Medication Sig Start Date End Date Taking? Authorizing Provider  albuterol  (PROVENTIL ) (2.5 MG/3ML) 0.083% nebulizer solution Inhale 3 mLs into the lungs every 6 (six) hours as needed. 08/14/23   Scoggins, Amber, NP  ARIPiprazole  (ABILIFY ) 2 MG tablet Take 1 tablet (2 mg total) by mouth daily. 03/05/24   Ezzard Staci SAILOR, NP  atorvastatin  (LIPITOR) 40 MG tablet Take 1 tablet (40 mg total) by mouth daily. 08/14/23   Scoggins, Hospital doctor, NP  benzonatate  (TESSALON  PERLES) 100 MG capsule Take 1 capsule (100 mg total) by mouth 3 (three) times daily as needed for cough. 08/14/23 08/13/24  Scoggins, Amber, NP  Blood Glucose Monitoring Suppl (ACCU-CHEK GUIDE ME) w/Device KIT Use to check blood glucose three times daily before meals and at bedtime 09/25/23   Orlean Alan HERO, FNP  celecoxib  (CELEBREX ) 200 MG capsule Take 1 capsule (200 mg total) by mouth in the morning and at bedtime. 08/14/23   Scoggins, Amber, NP  COMFORT EZ PEN NEEDLES 32G X 4 MM MISC 1 Needle in the morning, at noon, in the evening, and at bedtime. 05/13/22   [provider]  Fluticasone -Umeclidin-Vilant (TRELEGY ELLIPTA ) 100-62.5-25 MCG/ACT AEPB Inhale 1 Inhalation into the lungs daily. 11/10/23   Orlean Alan HERO, FNP  glucose blood (ACCU-CHEK GUIDE) test strip Use to check blood glucose before meals and at bedtime 09/25/23   Orlean Alan HERO, FNP  hydrOXYzine  (ATARAX ) 50 MG tablet Take 1 tablet (50 mg  total) by mouth as needed. 02/23/24   Orlean Alan HERO, FNP  insulin  glargine (LANTUS ) 100 unit/mL SOPN Inject 15 Units into the skin at bedtime. 08/14/23   Scoggins, Amber, NP  insulin  lispro (HUMALOG  KWIKPEN) 200 UNIT/ML KwikPen Inject 12 Units into the skin in the morning, at noon, and at bedtime. Patient taking differently: Inject 7 Units into the skin 4 (four) times daily. 08/14/23   Scoggins, Amber, NP  Ipratropium-Albuterol  (COMBIVENT ) 20-100 MCG/ACT AERS respimat Inhale 1 puff into the lungs every 6 (six) hours as needed. 08/14/23   Scoggins, Amber, NP  Lancets (ONETOUCH ULTRASOFT) lancets Use to check blood sugars twice daily. 11/10/23   Orlean Alan HERO, FNP  losartan  (COZAAR ) 50 MG tablet Take 1 tablet (50 mg total) by mouth daily. 01/12/24   Orlean Alan HERO, FNP  melatonin 5 MG TABS Take 2 tablets by mouth at bedtime as needed.    [provider]  metFORMIN  (GLUCOPHAGE ) 1000 MG tablet Take 1 tablet (1,000 mg total) by mouth 2 (two) times daily with a meal. 08/14/23   Scoggins, Jeoffrey, NP  methocarbamol  (ROBAXIN ) 500 MG tablet Take 1 tablet (500 mg total) by mouth 3 (three) times daily. 01/12/24   Orlean Alan HERO, FNP  montelukast  (SINGULAIR ) 10 MG tablet Take 1 tablet by mouth daily.    [provider]  Multiple Vitamins-Minerals (CENTRUM SILVER 50+WOMEN) TABS Take 1 capsule by mouth See admin instructions.    [provider]  mupirocin  cream (BACTROBAN ) 2 % Apply 1 Application topically 2 (two) times daily. 08/14/23   Scoggins, Hospital doctor, NP  nitroGLYCERIN  (NITROSTAT ) 0.4 MG SL tablet Place 1 tablet (0.4 mg total) under the tongue every 5 (five) minutes as needed for chest pain. 08/14/23 08/13/24  Scoggins, Amber, NP  omeprazole  (PRILOSEC) 20 MG capsule Take 1 capsule (20 mg total) by mouth daily. 08/14/23   Scoggins, Hospital doctor, NP  ondansetron  (ZOFRAN -ODT) 4 MG disintegrating tablet DISSOLVE ONE TABLET ON THE TONGUE daily    [provider]  oxybutynin  (DITROPAN ) 5  MG tablet Take 5 mg by mouth daily. 07/03/23   [provider]  oxyCODONE -acetaminophen  (PERCOCET) 7.5-325 MG tablet Take 1 tablet by mouth every 6 (six) hours as needed for severe pain (pain score 7-10). 03/04/24   Orlean Alan HERO, FNP  OXYGEN 3LPM    [provider]  pregabalin  (LYRICA ) 200 MG capsule Take 1 capsule (200 mg total) by mouth in the morning, at noon, and at bedtime. 01/12/24   Orlean Alan HERO, FNP  traZODone  (DESYREL ) 100 MG tablet Take 2 tablets (200 mg total) by mouth at bedtime. 08/14/23   Scoggins, Amber, NP  trospium  (SANCTURA ) 20 MG tablet Take 1 tablet (20 mg total) by mouth daily. 08/14/23   Scoggins, Amber, NP  venlafaxine  XR (EFFEXOR -XR) 150 MG 24 hr capsule Take 2 capsules (300 mg total) by mouth daily. 02/23/24   Orlean Alan HERO, FNP    Physical Exam: Vitals:   05/11/24 0450 05/11/24 0507 05/11/24 0530 05/11/24 0615  BP: 99/65  90/61 90/61  Pulse: 85  85 85  Resp: 20  20 (!) 22  Temp:  98.1 F (36.7 C)    TempSrc:  Oral    SpO2: 95%  100% 97%  Weight:      Height:        Physical Exam   Constitutional: Anxious, obese older Hispanic lady Eyes: PERRL, lids and conjunctivae normal Neck: normal, supple, no masses, no thyromegaly Respiratory: End expiratory wheezing bilaterally. Normal respiratory effort at rest, conversational dyspnea. No accessory muscle use.  Cardiovascular: Regular rate and rhythm, no murmurs / rubs / gallops. No extremity edema. 2+ radial and pedal pulses.  Abdomen: Obese, nontender.  Bowel sounds positive x 4 quadrants.  Musculoskeletal: No joint deformity upper and lower extremities. Good ROM, no contractures. Normal muscle tone.  Neurologic: CN 2-12 grossly intact.  Alert and oriented X 3. Psychiatric: Anxious, (+)visual hallucinations. Fair judgment and fair insight.  Depressed and paranoid mood, affect is depressed however she does not present as paranoid.  Experiencing psychological distress surrounding current  immigration climate.    Labs on Admission: I have personally reviewed following labs and imaging studies  CBC: Recent Labs  Lab 05/10/24 1948  WBC 10.8*  HGB 15.4*  HCT 45.9  MCV 89.0  PLT 268   Basic Metabolic Panel: Recent Labs  Lab 05/10/24 1948  NA 139  K 2.7*  CL 101  CO2 25  GLUCOSE 156*  BUN 8  CREATININE 0.71  CALCIUM  9.1  MG 2.3   GFR: Estimated Creatinine Clearance: 77.4 mL/min (by C-G formula based  on SCr of 0.71 mg/dL). Liver Function Tests: Recent Labs  Lab 05/10/24 1948  AST 57*  ALT 32  ALKPHOS 108  BILITOT 0.8  PROT 8.4*  ALBUMIN 3.9   No results for input(s): LIPASE, AMYLASE in the last 168 hours. No results for input(s): AMMONIA in the last 168 hours. Coagulation Profile: No results for input(s): INR, PROTIME in the last 168 hours. Cardiac Enzymes: Recent Labs  Lab 05/10/24 1948  TROPONINIHS 8   BNP (last 3 results) No results for input(s): BNP in the last 8760 hours. HbA1C: No results for input(s): HGBA1C in the last 72 hours. CBG: No results for input(s): GLUCAP in the last 168 hours. Lipid Profile: No results for input(s): CHOL, HDL, LDLCALC, TRIG, CHOLHDL, LDLDIRECT in the last 72 hours. Thyroid Function Tests: No results for input(s): TSH, T4TOTAL, FREET4, T3FREE, THYROIDAB in the last 72 hours. Anemia Panel: No results for input(s): VITAMINB12, FOLATE, FERRITIN, TIBC, IRON, RETICCTPCT in the last 72 hours. Urine analysis:    Component Value Date/Time   COLORURINE STRAW (A) 05/10/2024 1948   APPEARANCEUR CLEAR (A) 05/10/2024 1948   LABSPEC 1.002 (L) 05/10/2024 1948   PHURINE 7.0 05/10/2024 1948   GLUCOSEU NEGATIVE 05/10/2024 1948   HGBUR SMALL (A) 05/10/2024 1948   BILIRUBINUR NEGATIVE 05/10/2024 1948   KETONESUR NEGATIVE 05/10/2024 1948   PROTEINUR NEGATIVE 05/10/2024 1948   NITRITE NEGATIVE 05/10/2024 1948   LEUKOCYTESUR NEGATIVE 05/10/2024 1948    Radiological  Exams on Admission: I have personally reviewed images DG Chest Portable 1 View Result Date: 05/11/2024 EXAM: 1 VIEW XRAY OF THE CHEST 05/11/2024 05:11:00 AM COMPARISON: 2-view chest x-ray 11/28/2023. CLINICAL HISTORY: Hypoxia. Patient is a 63 year old female presenting to [facility] ED under IVC. Per triage note Pt arrives seeking treatment for alcohol. FINDINGS: LUNGS AND PLEURA: The lung volumes are low. Changes of COPD are present. Mild atelectasis is present at the lung bases. No focal pulmonary opacity. No pulmonary edema. No pleural effusion. No pneumothorax. HEART AND MEDIASTINUM: No acute abnormality of the cardiac and mediastinal silhouettes. BONES AND SOFT TISSUES: No acute osseous abnormality. IMPRESSION: 1. Low lung volumes and mild basilar atelectasis. 2. Changes of COPD. Electronically signed by: Lonni Necessary MD 05/11/2024 06:13 AM EDT RP Workstation: [id number]    EKG: My personal interpretation of EKG shows: Normal sinus rhythm, HR 84     Assessment/Plan Principal Problem:   COPD with acute exacerbation (HCC) Active Problems:   Alcohol use disorder   Severe episode of recurrent major depressive disorder, without psychotic features (HCC)   Suicidal ideation   Homicidal thoughts    ##COPD exacerbation ##Chronic hypoxic respiratory failure Endorses increased dyspnea, green sputum production, and wheezing that started Thursday evening - Daily prednisone , IV Solu-Medrol  given x 1 in the ED - IV Rocephin  and Zithromax  - Duonebs - Add on Sputum culture  - Mucinex  - Supplemental O2 as needed - Pulmonary toilet: Mobilize/incentive spirometry/flutter valve   ##Suicidal ideation ##Homicidal ideation Patient is under IVC - One-to-one safety monitoring - Psychiatry consulted appreciate their recommendations and management  ##Hypokalemia - s/p supplementation in the ED, supplement as needed - Repeat metabolic panel with a.m. labs  Hypertension - Continue home  losartan   Hyperlipidemia - Continue home statin  # Type 2 diabetes mellitus - Continue home 12 units Lantus  - ACHS SSI and CBG monitoring - Check A1c  #Alcohol use disorder Patient reports until recently she has been sober for 20 years, since relapse she has been drinking 1 gallon of  tequila each weekend over a 3-day period. - CIWA protocol with as needed symptom triggered Ativan  - Folic acid , thiamine , multivitamin  #Fibromyalgia #Chronic pain PDMP reviewed patient last received a 30-day supply of pregabalin  in February and a 7-day supply of Percocet in April - Continue pregabalin   - As needed Percocet  #Depression #Anxiety - Continue home Abilify , Effexor   #Tobacco use disorder - NRT with nicotine  patch and as needed Nicorette  gum while inpatient - Patient counseled on cessation  #GERD - Protonix   VTE prophylaxis:  Lovenox   GI prophylaxis: Protonix   Diet: Heart healthy Access: PIV Lines: None Code Status: Full Telemetry: No Disposition: Observe on MedSurg   Admission status: Observation, MedSurg Patient is from: Home Anticipated d/c is to: Home or inpatient psychiatric facility Anticipated d/c date is: 1-2 days Patient currently: Receiving p.o. steroids, IV antibiotics and pending psychiatry consult  Family Communication: Daughter Tobias called at [phone] and updated.  Consults called: Psychiatry   Severity of Illness: The appropriate patient status for this patient is OBSERVATION. Observation status is judged to be reasonable and necessary in order to provide the required intensity of service to ensure the patient's safety. The patient's presenting symptoms, physical exam findings, and initial radiographic and laboratory data in the context of their medical condition is felt to place them at decreased risk for further clinical deterioration. Furthermore, it is anticipated that the patient will be medically stable for discharge from the hospital within 2  midnights of admission.   To reach the provider On-Call:   7AM- 7PM see care teams to locate the attending and reach out to them via [email or web address] Password: [email or web address] 7PM-7AM contact night-coverage If you still have difficulty reaching the appropriate provider, please page the [facility] (Director on Call) for [facility] on amion for assistance  This document was prepared using Conservation officer, historic buildings and may include unintentional dictation errors.  Rockie Rams FNP-BC, PMHNP-BC Nurse Practitioner [facility]

## 2024-05-11 NOTE — ED Provider Notes (Signed)
 5:12 AM  Assumed care of patient at shift change.  Patient with history of asthma, COPD on chronic oxygen who presented to the emergency department with suicidal thoughts, alcohol intoxication.  Patient initially hypotensive and hypokalemic.  Receiving potassium replacement, IV fluids and blood pressure slowly improving.  No fever, productive cough, vomiting or diarrhea.  Likely hypotensive due to volume depletion.  On my reassessment, patient is now wheezing with slight increased work of breathing.  She states she feels short of breath.  No chest pain.  She denies any fevers or productive cough.  Will give breathing treatments, Solu-Medrol .  I feel she will need medical admission.  Initial EKG showed sinus tachycardia versus SVT.  Repeat EKG shows sinus rhythm with minimal inferior ST elevation with no reciprocal change.  She is not having any chest discomfort.   Psychiatry is following along with patient given her suicidal thoughts.  She is under IVC.  Consulted and discussed patient's case with hospitalist, Dr. Cleatus.  I have recommended admission and consulting physician agrees and will place admission orders.  Patient (and family if present) agree with this plan.   I reviewed all nursing notes, vitals, pertinent previous records.  All labs, EKGs, imaging ordered have been independently reviewed and interpreted by myself.   CRITICAL CARE Performed by: Josette Sink   Total critical care time: 30 minutes  Critical care time was exclusive of separately billable procedures and treating other patients.  Critical care was necessary to treat or prevent imminent or life-threatening deterioration.  Critical care was time spent personally by me on the following activities: development of treatment plan with patient and/or surrogate as well as nursing, discussions with consultants, evaluation of patient's response to treatment, examination of patient, obtaining history from patient or surrogate,  ordering and performing treatments and interventions, ordering and review of laboratory studies, ordering and review of radiographic studies, pulse oximetry and re-evaluation of patient's condition.    EKG Interpretation Date/Time:  Saturday May 11 2024 04:45:58 EDT Ventricular Rate:  84 PR Interval:  178 QRS Duration:  84 QT Interval:  391 QTC Calculation: 463 R Axis:   63  Text Interpretation: Sinus rhythm ST elevation, consider inferior injury Confirmed by Alajah Witman, Josette [phone]) on 05/11/2024 8:06:24 AM          Georgianna Band, Josette SAILOR, DO 05/11/24 [id number]

## 2024-05-11 NOTE — Consult Note (Signed)
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  Orientation:Full (Time, Place and Person)  Thought Content:Logical  History of Schizophrenia/Schizoaffective disorder:No  Duration of Psychotic Symptoms:No data recorded Hallucinations:Hallucinations: Auditory; Visual Description of Auditory Hallucinations: reports hearing her mom's voice Description of Visual Hallucinations: reports seeing roaches  Ideas of Reference:None  Suicidal Thoughts:Suicidal Thoughts: No  Homicidal  Thoughts:Homicidal Thoughts: No   Sensorium  Memory:Immediate Fair; Recent Poor; Remote Fair  Judgment:Poor  Insight:Poor   Executive Functions  Concentration:Fair  Attention Span:Fair  Recall:Fair  Fund of Knowledge:Fair  Language:Fair   Psychomotor Activity  Psychomotor Activity:Psychomotor Activity: Increased   Assets  Assets:Communication Skills; Desire for Improvement; Social Support   Sleep  Sleep:Sleep: -- (Unknown)   Physical Exam: Physical Exam ROS Blood pressure 134/82, pulse 100, temperature 98.7 F (37.1 C), temperature source Oral, resp. rate 20, height 4' 9 (1.448 m), weight 85.8 kg, SpO2 98%. Body mass index is 40.93 kg/m.  Treatment Plan Summary:  Diagnosis- Alcohol use disorder.    Level was 292.  Bipolar 1 disorder current episode mixed.   Will recommend patient to be admitted to inpatient behavior Health unit inpatient as pt expressed suicidal thoughts to daughter even when she was not under the influence of Alcohol ad given previous suicidal attempts.  At this time patient appears to be minimizing her symptoms and alcohol abuse.   Plan- Discontinue Effexor .   Reviewed QTC 463 millisecond - Increase Abilify  to 5 mg today. Plan to taper up to 10 mg tomorrow.   Start patient on Lamictal 25 mg daily.      CIWA protocol   Medical - defer to primaryl team  Plan discussed w/ primary team via secure chat.  Disposition: Recommend psychiatric Inpatient admission when medically cleared.  Krystalle Pilkington, MD 05/11/2024 7:50 PM

## 2024-05-11 NOTE — Progress Notes (Signed)
 Anticoagulation monitoring(Lovenox ):  63 yo female ordered Lovenox  40 mg Q24h    Filed Weights   05/10/24 1931  Weight: 115 kg (253 lb 8.5 oz)   BMI 59   Lab Results  Component Value Date   CREATININE 0.71 05/10/2024   CREATININE 0.74 11/28/2023   CREATININE 0.75 09/11/2023   Estimated Creatinine Clearance: 77.4 mL/min (by C-G formula based on SCr of 0.71 mg/dL). Hemoglobin & Hematocrit     Component Value Date/Time   HGB 15.4 (H) 05/10/2024 1948   HGB 14.1 09/11/2023 1213   HCT 45.9 05/10/2024 1948   HCT 43.5 09/11/2023 1213     Per Protocol for Patient with estCrcl > 30 ml/min and BMI > 30, will transition to Lovenox  57.5 mg Q24h.

## 2024-05-12 DIAGNOSIS — F319 Bipolar disorder, unspecified: Secondary | ICD-10-CM | POA: Diagnosis not present

## 2024-05-12 DIAGNOSIS — G629 Polyneuropathy, unspecified: Secondary | ICD-10-CM

## 2024-05-12 DIAGNOSIS — F3162 Bipolar disorder, current episode mixed, moderate: Secondary | ICD-10-CM | POA: Diagnosis not present

## 2024-05-12 DIAGNOSIS — G8929 Other chronic pain: Secondary | ICD-10-CM

## 2024-05-12 DIAGNOSIS — J441 Chronic obstructive pulmonary disease with (acute) exacerbation: Secondary | ICD-10-CM | POA: Diagnosis not present

## 2024-05-12 DIAGNOSIS — M25512 Pain in left shoulder: Secondary | ICD-10-CM

## 2024-05-12 DIAGNOSIS — R45851 Suicidal ideations: Secondary | ICD-10-CM | POA: Diagnosis not present

## 2024-05-12 DIAGNOSIS — E66813 Obesity, class 3: Secondary | ICD-10-CM | POA: Insufficient documentation

## 2024-05-12 DIAGNOSIS — J9611 Chronic respiratory failure with hypoxia: Secondary | ICD-10-CM

## 2024-05-12 DIAGNOSIS — E11649 Type 2 diabetes mellitus with hypoglycemia without coma: Secondary | ICD-10-CM | POA: Diagnosis not present

## 2024-05-12 DIAGNOSIS — F1090 Alcohol use, unspecified, uncomplicated: Secondary | ICD-10-CM | POA: Diagnosis not present

## 2024-05-12 DIAGNOSIS — E785 Hyperlipidemia, unspecified: Secondary | ICD-10-CM | POA: Diagnosis not present

## 2024-05-12 LAB — CBC
HCT: 37.1 % (ref 36.0–46.0)
Hemoglobin: 12.3 g/dL (ref 12.0–15.0)
MCH: 29.9 pg (ref 26.0–34.0)
MCHC: 33.2 g/dL (ref 30.0–36.0)
MCV: 90.3 fL (ref 80.0–100.0)
Platelets: 169 10*3/uL (ref 150–400)
RBC: 4.11 MIL/uL (ref 3.87–5.11)
RDW: 13.9 % (ref 11.5–15.5)
WBC: 6.3 10*3/uL (ref 4.0–10.5)
nRBC: 0 % (ref 0.0–0.2)

## 2024-05-12 LAB — BASIC METABOLIC PANEL WITH GFR
Anion gap: 4 — ABNORMAL LOW (ref 5–15)
BUN: 12 mg/dL (ref 8–23)
CO2: 26 mmol/L (ref 22–32)
Calcium: 8.8 mg/dL — ABNORMAL LOW (ref 8.9–10.3)
Chloride: 105 mmol/L (ref 98–111)
Creatinine, Ser: 0.64 mg/dL (ref 0.44–1.00)
GFR, Estimated: >60 mL/min (ref >60)
Glucose, Bld: 186 mg/dL — ABNORMAL HIGH (ref 70–99)
Potassium: 4.2 mmol/L (ref 3.5–5.1)
Sodium: 135 mmol/L (ref 135–145)

## 2024-05-12 LAB — GLUCOSE, CAPILLARY
Glucose-Capillary: 156 mg/dL — ABNORMAL HIGH (ref 70–99)
Glucose-Capillary: 187 mg/dL — ABNORMAL HIGH (ref 70–99)
Glucose-Capillary: 286 mg/dL — ABNORMAL HIGH (ref 70–99)
Glucose-Capillary: 246 mg/dL — ABNORMAL HIGH (ref 70–99)

## 2024-05-12 MED ORDER — METHYLPREDNISOLONE SODIUM SUCC 40 MG IJ SOLR
40.0000 mg | Freq: Every day | INTRAMUSCULAR | Status: DC
  Administered 2024-05-12 – 2024-05-14 (×3): 40 mg via INTRAVENOUS
  Filled 2024-05-12 (×3): qty 1

## 2024-05-12 MED ORDER — METHOCARBAMOL 500 MG PO TABS
500.0000 mg | ORAL_TABLET | Freq: Three times a day (TID) | ORAL | Status: DC
  Administered 2024-05-12 – 2024-05-14 (×7): 500 mg via ORAL
  Filled 2024-05-12 (×7): qty 1

## 2024-05-12 MED ORDER — ENOXAPARIN SODIUM 60 MG/0.6ML IJ SOSY
0.5000 mg/kg | PREFILLED_SYRINGE | INTRAMUSCULAR | Status: DC
  Administered 2024-05-13 – 2024-05-14 (×2): 42.5 mg via SUBCUTANEOUS
  Filled 2024-05-12 (×2): qty 0.6

## 2024-05-12 MED ORDER — ARIPIPRAZOLE 2 MG PO TABS
10.0000 mg | ORAL_TABLET | Freq: Two times a day (BID) | ORAL | Status: DC
  Administered 2024-05-12 – 2024-05-14 (×4): 10 mg via ORAL
  Filled 2024-05-12 (×4): qty 5

## 2024-05-12 MED ORDER — IPRATROPIUM-ALBUTEROL 0.5-2.5 (3) MG/3ML IN SOLN
3.0000 mL | Freq: Two times a day (BID) | RESPIRATORY_TRACT | Status: DC
  Administered 2024-05-12 – 2024-05-14 (×4): 3 mL via RESPIRATORY_TRACT
  Filled 2024-05-12 (×4): qty 3

## 2024-05-12 NOTE — Assessment & Plan Note (Signed)
 PT and OT consultations.  Able to move left arm with passive range of motion rather than active range of motion.  Will try Robaxin .

## 2024-05-12 NOTE — Assessment & Plan Note (Signed)
 On Lipitor

## 2024-05-12 NOTE — Assessment & Plan Note (Signed)
 Patient given Solu-Medrol  here will give prednisone  for 2 more days upon going home.  Refill Trelegy inhaler.  Has her albuterol  inhaler

## 2024-05-12 NOTE — Assessment & Plan Note (Signed)
 Appreciate psychiatric follow-up.  Abilify  increased to 10 mg twice a day and started on lithium .  Psychiatry team would like to monitor another day and reassess tomorrow.  Currently no psychiatric beds.

## 2024-05-12 NOTE — Assessment & Plan Note (Signed)
 On losartan

## 2024-05-12 NOTE — Assessment & Plan Note (Signed)
 BMI 40.93.

## 2024-05-12 NOTE — Hospital Course (Signed)
 63 y.o. year old female with past medical history of treatment resistant asthma, tobacco use disorder, ILD, COPD with chronic hypoxic respiratory failure on 3 L O2 at home, obstructive sleep apnea's, type 2 diabetes mellitus, chronic pain, anxiety, depression with suicide attempt in 2013, fibromyalgia, diverticulitis, coronary artery disease, hypertension.  She presents to [facility] ED with suicidal ideation and alcohol abuse.  She was initially awaiting psychiatric consultation however during reassessment by ED physician it was noted that patient was wheezing and had mildly increased work of breathing with associated dyspnea.  On my interview she endorses productive cough with green sputum and increased dyspnea that started Thursday evening.  She denies fever, chest pain, fatigue, nausea, vomiting or diarrhea.  As far as her psychiatric she endorses thoughts of wanting to harm herself as well as her daughter's boyfriend citing he had something to do with her daughter being sent to jail.  She states that she thinks about harming him at baseline but when she drinks there is a voice in her head that gives her the encouraged to want to do something about it and the voice tells her to go after him when she sees him.  She reports she has not acted on this.  She reports until recently she had been sober from alcohol for 20 years but had a recent relapse and is now drinking 1 gallon of tequila every weekend over 3-day period.  She endorses visual hallucinations at baseline that is worsened when she drinks, often seeing bugs crawling on the ground.  She also endorses a feeling or that someone is watching her.  Lastly patient endorses being psychologically distressed by the current climate of immigrants being detained and deported.  [facility] in person Spanish interpreter utilized for translation during this interview.   6/22.  Patient sometimes gets numbness in her extremities and having some difficulty  with her left shoulder.  States her breathing is better but still having some wheezing. 6/23.  Patient's left shoulder is doing better.  Patient's breathing is better. 6/24.  Psychiatry cleared to go home.  Medically stable for discharge.

## 2024-05-12 NOTE — Evaluation (Signed)
 Physical Therapy Evaluation Patient Details Name: Kelly Dillon MRN: [id number] DOB: 1961-10-23 Today's Date: 05/12/2024  History of Present Illness  Kelly Dillon is a 63yoF who comes [facility] on 05/10/24 with SI, ETOH abuse. PMH: BPD, asthma, tobacco use, ILD, COPD, CRF on 3L, OSA, DM2, chronic pain, GAD, depression, suicide attempt 2013, fibromylagia, CAD, HTN, ETOH in remission x20 years.  Clinical Impression  Pt feels good today, she sports having AMB twice around nurses station yesterday, continues to require RW or IV pole for balance as is her baseline, also remains on her baseline O2 flow rate. Pt demonstrates modI transfers, AMB, bed mobility, is given assist as she cannot manage an O2 tank and RW at the same time, also a baseline issue. Pt thankful for opportunity to be mobile and maintain her independence while admitted. Will continue to follow to maximize her daily functional baseline in mobility while admitted.       If plan is discharge home, recommend the following: Direct supervision/assist for medications management;Direct supervision/assist for financial management;Assist for transportation;Help with stairs or ramp for entrance   Can travel by private vehicle        Equipment Recommendations Other (comment) (portable O2 concentrator)  Recommendations for Other Services       Functional Status Assessment Patient has had a recent decline in their functional status and demonstrates the ability to make significant improvements in function in a reasonable and predictable amount of time.     Precautions / Restrictions Precautions Precautions: Fall Recall of Precautions/Restrictions: Intact Restrictions Weight Bearing Restrictions Per Provider Order: No      Mobility  Bed Mobility Overal bed mobility: Modified Independent                  Transfers Overall transfer level: Modified independent Equipment used: Rolling walker (2 wheels)                     Ambulation/Gait Ambulation/Gait assistance: Supervision, Contact guard assist Gait Distance (Feet): 350 Feet Assistive device: Rolling walker (2 wheels)   Gait velocity: >0.6m/s)     General Gait Details: maintained on 3L/min O2 (95% SpO2)  Stairs            Wheelchair Mobility     Tilt Bed    Modified Rankin (Stroke Patients Only)       Balance                                             Pertinent Vitals/Pain Pain Assessment Pain Assessment:  (her typical pain all over, low back etrc, does not hinder our session)    Home Living Family/patient expects to be discharged to:: Private residence Living Arrangements: Alone Available Help at Discharge:  (DTR lives in [place], has a Information systems manager also) Type of Home: Mobile home Home Access: Stairs to enter   Secretary/administrator of Steps: 2-3 stairs   Home Layout: One level Home Equipment: Other (comment) Additional Comments: pt needs a concentrator for home (has tanks, but cannot manage her RW and O2 at the same time)    Prior Function Prior Level of Function : Driving;Independent/Modified Independent             Mobility Comments: RW use for household distances, on O2 at home 2-3L (does not have a portable concentrator) ADLs Comments: modI     Extremity/Trunk Assessment  Communication        Cognition Arousal: Alert Behavior During Therapy: WFL for tasks assessed/performed   PT - Cognitive impairments: No apparent impairments                                 Cueing       General Comments      Exercises     Assessment/Plan    PT Assessment Patient needs continued PT services  PT Problem List Decreased activity tolerance;Decreased balance;Decreased mobility       PT Treatment Interventions DME instruction;Gait training;Stair training;Functional mobility training;Therapeutic activities;Therapeutic exercise;Balance training     PT Goals (Current goals can be found in the Care Plan section)  Acute Rehab PT Goals Patient Stated Goal: have a portabel concentrator so she can leave the house for longer periods and manage her safe mobility. PT Goal Formulation: With patient Time For Goal Achievement: 05/26/24 Potential to Achieve Goals: Good    Frequency Min 1X/week     Co-evaluation               AM-PAC PT 6 Clicks Mobility  Outcome Measure Help needed turning from your back to your side while in a flat bed without using bedrails?: None Help needed moving from lying on your back to sitting on the side of a flat bed without using bedrails?: None Help needed moving to and from a bed to a chair (including a wheelchair)?: None Help needed standing up from a chair using your arms (e.g., wheelchair or bedside chair)?: None Help needed to walk in hospital room?: A Little Help needed climbing 3-5 steps with a railing? : A Little 6 Click Score: 22    End of Session Equipment Utilized During Treatment: Gait belt;Oxygen Activity Tolerance: Patient tolerated treatment well;No increased pain Patient left: in bed;with nursing/sitter in room;with call bell/phone within reach Nurse Communication: Mobility status PT Visit Diagnosis: Unsteadiness on feet (R26.81);Difficulty in walking, not elsewhere classified (R26.2);Other abnormalities of gait and mobility (R26.89);Other symptoms and signs involving the nervous system (R29.898)    Time: 8656-8594 PT Time Calculation (min) (ACUTE ONLY): 22 min   Charges:   PT Evaluation $PT Eval Low Complexity: 1 Low PT Treatments $Therapeutic Activity: 8-22 mins PT General Charges $$ ACUTE PT VISIT: 1 Visit       2:20 PM, 05/12/24 Peggye JAYSON Linear, PT, DPT Physical Therapist - [facility]  [phone] (ASCOM)    Quashaun Lazalde C 05/12/2024, 2:18 PM

## 2024-05-12 NOTE — Assessment & Plan Note (Signed)
 On Protonix

## 2024-05-12 NOTE — Assessment & Plan Note (Signed)
 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  Continue Lyrica .

## 2024-05-12 NOTE — TOC Initial Note (Signed)
 Transition of Care [facility]) - Initial/Assessment Note    Patient Details  Name: Kelly Dillon MRN: [id number] Date of Birth: 1960-12-10  Transition of [facility]) CM/SW Contact:    Quintella Suzen Jansky, RN Phone Number: 05/12/2024, 7:27 AM  Clinical Narrative:                  Patient is currently under IVC. Sitter in place. TOC will continue to monitor for discharge plan.        Patient Goals and CMS Choice            Expected Discharge Plan and Services                                              Prior Living Arrangements/Services                       Activities of Daily Living   ADL Screening (condition at time of admission) Independently performs ADLs?: Yes (appropriate for developmental age) Is the patient deaf or have difficulty hearing?: No Does the patient have difficulty seeing, even when wearing glasses/contacts?: No Does the patient have difficulty concentrating, remembering, or making decisions?: No  Permission Sought/Granted                  Emotional Assessment              Admission diagnosis:  Suicidal ideation [R45.851] Hypokalemia [E87.6] COPD with acute exacerbation (HCC) [J44.1] Alcohol use disorder [F10.90] COPD exacerbation (HCC) [J44.1] Patient Active Problem List   Diagnosis Date Noted   Uncontrolled type 2 diabetes mellitus with hypoglycemia, with long-term current use of insulin  (HCC) 05/11/2024   COPD exacerbation (HCC) 05/11/2024   Severe episode of recurrent major depressive disorder, without psychotic features (HCC) 05/10/2024   Suicidal ideation 05/10/2024   Homicidal thoughts 05/10/2024   COPD with acute exacerbation (HCC) 11/29/2023   Acute on chronic respiratory failure with hypoxemia (HCC) 11/28/2023   Essential hypertension 05/26/2022   Hardening of the aorta (main artery of the heart) (HCC) 05/26/2022   Major depressive disorder, single episode, severe with psychotic features (HCC)  05/26/2022   Hyperlipidemia 05/26/2022   Osteoarthritis of knee 05/26/2022   Overactive bladder 05/26/2022   Sciatica 05/26/2022   Alcohol use disorder 02/08/2022   Diverticulitis 02/08/2022   Frailty 02/08/2022   Incisional hernia without obstruction or gangrene 02/08/2022   Long term (current) use of insulin  (HCC) 02/08/2022   Moderate major depression, single episode (HCC) 02/08/2022   Oxygen dependent 02/08/2022   Smoking trying to quit 02/08/2022   Dysphagia 02/08/2022   Immunodeficiency due to conditions classified elsewhere (HCC) 02/08/2022   Hypokalemia 08/27/2021   Moderate persistent asthma, uncomplicated 08/27/2021   S/P repair of paraesophageal hernia 01/16/2020   Dysfunctional voiding of urine 08/21/2019   Scaly patch rash 08/21/2019   Restrictive lung disease 11/03/2018   Insomnia 10/13/2018   Dyslipidemia 10/13/2018   Other specified anxiety disorders 09/22/2018   Abdominal pain 08/17/2018   Ventral hernia, recurrent 08/15/2018   Chronic generalized abdominal pain (Primary Area of Pain) 02/21/2018   Chronic ankle pain, bilateral (Secondary Area of Pain) (L>R) 02/21/2018   Chronic pain of both knees [facility] Area of Pain) (L>R) 02/21/2018   Chronic bilateral low back pain with bilateral sciatica (Fourth Area of Pain) (L>R) 02/21/2018   Chronic neck pain(midline)  02/21/2018   Chronic pain of both shoulders(L>R) 02/21/2018   Chronic upper extremity pain (L>R) 02/21/2018   Chronic pain of both lower extremities (L>R) 02/21/2018   Chronic pain syndrome 02/21/2018   Long term current use of opiate analgesic 02/21/2018   Pharmacologic therapy 02/21/2018   Disorder of skeletal system 02/21/2018   Problems influencing health status 02/21/2018   Morbid obesity (HCC) 09/28/2017   Shortness of breath 08/28/2017   High risk medication use 06/15/2017   Multiple joint pain 01/24/2017   Hernia of abdominal wall 01/12/2017   Sacroiliitis (HCC) 10/06/2016   Colon polyp  06/11/2016   BMI 39.0-39.9,adult 02/18/2016   Excessive daytime sleepiness 11/11/2015   Avascular necrosis of bones of both hips (HCC) 04/20/2015   Mixed stress and urge urinary incontinence 12/09/2014   Uncontrolled type 2 diabetes mellitus with hyperglycemia, with long-term current use of insulin  (HCC) 05/01/2013   Chronic, continuous use of opioids 03/13/2013   DDD (degenerative disc disease), lumbar 03/13/2013   Suicide attempt (HCC) 03/13/2013   Recurrent ventral hernia 08/28/2012   Moderate COPD (chronic obstructive pulmonary disease) (HCC) 07/11/2012   Tobacco abuse 03/01/2012   Depression, major, recurrent, moderate (HCC) 07/21/2011   Diverticulosis 07/21/2011   Fibromyalgia 07/21/2011   GERD (gastroesophageal reflux disease) 07/21/2011   PCP:  Orlean Alan HERO, FNP Pharmacy:   [id number] [address] [place] - [address] [place] [address] Phone: [phone] Fax: [phone]     Social Drivers of Health (SDOH) Social History: SDOH Screenings   Food Insecurity: Food Insecurity Present (05/11/2024)  Housing: High Risk (05/11/2024)  Transportation Needs: No Transportation Needs (05/11/2024)  Utilities: At Risk (05/11/2024)  Depression (PHQ2-9): High Risk (03/05/2024)  Financial Resource Strain: Medium Risk (11/22/2022)   Received from [facility]  Physical Activity: Insufficiently Active (08/21/2019)   Received from [facility]  Social Connections: Unknown (08/21/2019)   Received from [facility]  Stress: Stress Concern Present (08/21/2019)   Received from [facility]  Tobacco Use: High Risk (05/10/2024)   SDOH Interventions:     Readmission Risk Interventions     No data to display

## 2024-05-12 NOTE — Assessment & Plan Note (Signed)
 Replaced

## 2024-05-12 NOTE — Assessment & Plan Note (Signed)
 Chronically wears 2 L of oxygen.

## 2024-05-12 NOTE — Discharge Instructions (Signed)

## 2024-05-12 NOTE — Plan of Care (Signed)

## 2024-05-12 NOTE — Assessment & Plan Note (Signed)
 Psychiatry team following

## 2024-05-12 NOTE — Assessment & Plan Note (Signed)
 No signs of withdrawal during hospital stay

## 2024-05-12 NOTE — Consult Note (Signed)
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 Intravenous Q24H Foust, Katy L, NP 250 mL/hr at 05/12/24 1124 500 mg at 05/12/24 1124   cefTRIAXone  (ROCEPHIN ) 1 g in sodium chloride  0.9 % 100 mL IVPB  1 g Intravenous Q24H Foust, Katy L, NP 200 mL/hr at 05/12/24 1222 1 g at 05/12/24 1222   [START ON 05/13/2024] enoxaparin  (LOVENOX ) injection 42.5 mg  0.5 mg/kg Subcutaneous Q24H Hallaji, Sheema  M, RPH       folic acid  (FOLVITE ) tablet 1 mg  1 mg Oral Daily Duncan, Hazel V, MD   1 mg at 05/12/24 0825   guaiFENesin  (MUCINEX ) 12 hr tablet 600 mg  600 mg Oral BID Foust, Katy L, NP   600 mg at 05/12/24 0825   HYDROcodone -acetaminophen  (NORCO/VICODIN) 5-325 MG per tablet 1-2 tablet  1-2 tablet Oral Q4H PRN Duncan, Hazel V, MD   2 tablet at 05/11/24 1534   insulin  aspart (novoLOG ) injection 0-15 Units  0-15 Units Subcutaneous TID WC Foust, Katy L, NP   8 Units at 05/12/24 1638   insulin  aspart (novoLOG ) injection 0-5 Units  0-5 Units Subcutaneous QHS Foust, Katy L, NP   3 Units at 05/11/24 2137   insulin  glargine-yfgn (SEMGLEE ) injection 12 Units  12 Units Subcutaneous QHS Foust, Katy L, NP   12 Units at 05/11/24 2138   ipratropium-albuterol  (DUONEB) 0.5-2.5 (3) MG/3ML nebulizer solution 3 mL  3 mL Nebulization BID Wieting, Richard, MD       lithium  carbonate (LITHOBID ) ER tablet 300 mg  300 mg Oral Q12H Ginevra Tacker, MD   300 mg at 05/12/24 0827   LORazepam  (ATIVAN ) tablet 1-4 mg  1-4 mg Oral Q1H PRN Duncan, Hazel V, MD       Or   LORazepam  (ATIVAN ) injection 1-4 mg  1-4 mg Intravenous Q1H PRN Duncan, Hazel V, MD       losartan  (COZAAR ) tablet 50 mg  50 mg Oral Daily Duncan, Hazel V, MD   50 mg at 05/12/24 0825   methocarbamol  (ROBAXIN ) tablet 500 mg  500 mg Oral TID Josette Ade, MD   500 mg at 05/12/24 1638   methylPREDNISolone  sodium succinate (SOLU-MEDROL ) 40 mg/mL injection 40 mg  40 mg Intravenous Daily Josette Ade, MD   40 mg at 05/12/24 1118   multivitamin with minerals tablet 1 tablet  1 tablet Oral Daily Cleatus Delayne GAILS, MD   1 tablet at 05/12/24 9175   nicotine  (NICODERM CQ  - dosed in mg/24 hours) patch 14 mg  14 mg Transdermal Daily Foust, Katy L, NP   14 mg at 05/12/24 9171   nicotine  polacrilex (NICORETTE ) gum 2 mg  2 mg Oral PRN Foust, Katy L, NP   2 mg at 05/11/24 1445   ondansetron  (ZOFRAN ) tablet 4 mg  4 mg Oral Q6H PRN Duncan, Hazel V, MD       Or    ondansetron  (ZOFRAN ) injection 4 mg  4 mg Intravenous Q6H PRN Cleatus Delayne GAILS, MD       oxyCODONE -acetaminophen  (PERCOCET) 7.5-325 MG per tablet 1 tablet  1 tablet Oral Q6H PRN Cleatus Delayne GAILS, MD   1 tablet at 05/12/24 1448   pantoprazole  (PROTONIX ) EC tablet 40 mg  40 mg Oral Daily Foust, Katy L, NP   40 mg at 05/12/24 0825   pregabalin  (  LYRICA ) capsule 200 mg  200 mg Oral TID Foust, Katy L, NP   200 mg at 05/12/24 1637   thiamine  (VITAMIN B1) tablet 100 mg  100 mg Oral Daily Duncan, Hazel V, MD   100 mg at 05/12/24 0825   Or   thiamine  (VITAMIN B1) injection 100 mg  100 mg Intravenous Daily Cleatus Delayne GAILS, MD        Musculoskeletal: Strength & Muscle Tone: within normal limits Gait & Station:   Not evaluated.  Patient was lying down on the bed. Patient leans:   Not evaluated.    Psychiatric Specialty Exam:  Presentation  General Appearance: Well Groomed  Eye Contact:Fair  Speech:Pressured  Speech Volume:Increased  Handedness:No data recorded  Mood and Affect  Mood:Euphoric  Affect:Labile   Thought Process  Thought Processes:Goal Directed  Descriptions of Associations:Intact  Orientation:Full (Time, Place and Person)  Thought Content:Logical  History of Schizophrenia/Schizoaffective disorder:No  Duration of Psychotic Symptoms:No data recorded Hallucinations:Hallucinations: Auditory; Visual Description of Auditory Hallucinations: reports hearing her mom's voice Description of Visual Hallucinations: reports seeing roaches  Ideas of Reference:None  Suicidal Thoughts:Suicidal Thoughts: No  Homicidal Thoughts:Homicidal Thoughts: No   Sensorium  Memory:Immediate Fair; Recent Poor; Remote Fair  Judgment:Poor  Insight:Poor   Executive Functions  Concentration:Fair  Attention Span:Fair  Recall:Fair  Fund of Knowledge:Fair  Language:Fair   Psychomotor Activity  Psychomotor Activity:Psychomotor Activity: Increased   Assets   Assets:Communication Skills; Desire for Improvement; Social Support   Sleep  Sleep:Sleep: -- (Unknown)   Physical Exam: Physical Exam ROS Blood pressure (!) 157/88, pulse 76, temperature 98 F (36.7 C), temperature source Oral, resp. rate 18, height 4' 9 (1.448 m), weight 85.8 kg, SpO2 99%. Body mass index is 40.93 kg/m.  Treatment Plan Summary:  Diagnosis- Alcohol use disorder.    Level was 292.  Bipolar 1 disorder current episode mixed.   Will recommend patient to be admitted to inpatient behavior Health unit inpatient as pt expressed suicidal thoughts to daughter even when she was not under the influence of Alcohol ad given previous suicidal attempts.  At this time patient appears to be minimizing her symptoms and alcohol abuse.   Plan-   Increase Abilify  to 10 mg b.I.d..   Continue lithium  300 mg b.I.d.    CIWA protocol   Medical - defer to primaryl team  Plan discussed w/ primary team   In-person with the attending provider Dr. Charlie Patterson   Disposition: Recommend psychiatric Inpatient admission when medically cleared.  Keauna Brasel, MD 05/12/2024 5:16 PM

## 2024-05-12 NOTE — Assessment & Plan Note (Deleted)
 Patient on Semglee  insulin  and sliding scale.  Last hemoglobin A1c 6.0.  Sugars elevated with steroids.

## 2024-05-12 NOTE — Progress Notes (Addendum)
 Progress Note   Patient: Kelly Dillon FMW:[id number] DOB: 04/05/1961 DOA: 05/10/2024     1 DOS: the patient was seen and examined on 05/12/2024   Brief hospital course: 63 y.o. year old female with past medical history of treatment resistant asthma, tobacco use disorder, ILD, COPD with chronic hypoxic respiratory failure on 3 L O2 at home, obstructive sleep apnea's, type 2 diabetes mellitus, chronic pain, anxiety, depression with suicide attempt in 2013, fibromyalgia, diverticulitis, coronary artery disease, hypertension.  She presents to [facility] ED with suicidal ideation and alcohol abuse.  She was initially awaiting psychiatric consultation however during reassessment by ED physician it was noted that patient was wheezing and had mildly increased work of breathing with associated dyspnea.  On my interview she endorses productive cough with green sputum and increased dyspnea that started Thursday evening.  She denies fever, chest pain, fatigue, nausea, vomiting or diarrhea.  As far as her psychiatric she endorses thoughts of wanting to harm herself as well as her daughter's boyfriend citing he had something to do with her daughter being sent to jail.  She states that she thinks about harming him at baseline but when she drinks there is a voice in her head that gives her the encouraged to want to do something about it and the voice tells her to go after him when she sees him.  She reports she has not acted on this.  She reports until recently she had been sober from alcohol for 20 years but had a recent relapse and is now drinking 1 gallon of tequila every weekend over 3-day period.  She endorses visual hallucinations at baseline that is worsened when she drinks, often seeing bugs crawling on the ground.  She also endorses a feeling or that someone is watching her.  Lastly patient endorses being psychologically distressed by the current climate of immigrants being detained and  deported.  [facility] in person Spanish interpreter utilized for translation during this interview.   6/22.  Patient sometimes gets numbness in her extremities and having some difficulty with her left shoulder.  States her breathing is better but still having some wheezing.  Assessment and Plan: * COPD with acute exacerbation (HCC) Change prednisone  over to Solu-Medrol  for this afternoon.  Continue nebulizer treatments.  On empiric antibiotics.  Bipolar 1 disorder (HCC) Appreciate psychiatric follow-up.  Abilify  increased and started on lithium .  Psychiatry team would like to monitor for further.  Currently no psychiatric beds.  Alcohol use disorder Alcohol withdrawal protocol.  No signs of withdrawal currently.  Suicidal ideation Psychiatry team following  Uncontrolled type 2 diabetes mellitus with hypoglycemia, with long-term current use of insulin  (HCC) Patient on Semglee  insulin  and sliding scale.  Last hemoglobin A1c 6.0.  Sugars elevated with steroids.  Hyperlipidemia On Lipitor  Essential hypertension On losartan   GERD (gastroesophageal reflux disease) On Protonix   Chronic left shoulder pain PT and OT consultations.  Able to move left arm with passive range of motion rather than active range of motion.  Will try Robaxin .  Neuropathy 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  Continue Lyrica .  Obesity, Class III, BMI 40-49.9 (morbid obesity) BMI 40.93.  Chronic respiratory failure with hypoxia (HCC) Chronically wears 2 L of oxygen.  Hypokalemia Replaced        Subjective: Patient with a lot of complaints with numbness occasionally throughout her body.  Also having left shoulder pain but this been going on for a while.  Still with some shortness of  breath and cough.  Admitted with COPD exacerbation.  Psychiatry following for suicidal ideation and bipolar disorder.  Physical Exam: Vitals:   05/11/24 1927 05/11/24 2003 05/12/24 0413 05/12/24 0737  BP:  136/86  (!) 153/89 137/84  Pulse:  (!) 107 71 80  Resp:  20 20 18   Temp:  98.5 F (36.9 C) 98.4 F (36.9 C) 98.5 F (36.9 C)  TempSrc:  Oral Oral Oral  SpO2: 98% 97% 100% 100%  Weight:      Height:       Physical Exam HENT:     Head: Normocephalic.     Mouth/Throat:     Pharynx: No oropharyngeal exudate.   Eyes:     General: Lids are normal.     Conjunctiva/sclera: Conjunctivae normal.    Cardiovascular:     Rate and Rhythm: Normal rate and regular rhythm.     Heart sounds: Normal heart sounds, S1 normal and S2 normal.  Pulmonary:     Breath sounds: Examination of the left-middle field reveals wheezing. Examination of the right-lower field reveals decreased breath sounds and wheezing. Examination of the left-lower field reveals decreased breath sounds and wheezing. Decreased breath sounds and wheezing present.  Abdominal:     Palpations: Abdomen is soft.     Tenderness: There is no abdominal tenderness.   Musculoskeletal:     Right lower leg: No swelling.     Left lower leg: No swelling.     Comments: Able to lift left shoulder up with good passive range of motion.  Patient unable to do it on her own.  No point tenderness.   Skin:    General: Skin is warm.     Findings: No rash.   Neurological:     Mental Status: She is alert and oriented to person, place, and time.     Data Reviewed: Creatinine 0.64, CBC normal range  Family Communication: Tried to reach patient's daughter but mailbox is full  Disposition: Status is: Inpatient Remains inpatient appropriate because: Hopefully will go to the Geropsychiatry floor when medically cleared  Planned Discharge Destination: Home    Time spent: 28 minutes Case discussed with psychiatry  Author: Charlie Patterson, MD 05/12/2024 11:48 AM  For on call review [email or web address].

## 2024-05-13 DIAGNOSIS — J441 Chronic obstructive pulmonary disease with (acute) exacerbation: Secondary | ICD-10-CM | POA: Diagnosis not present

## 2024-05-13 DIAGNOSIS — F3162 Bipolar disorder, current episode mixed, moderate: Secondary | ICD-10-CM | POA: Diagnosis not present

## 2024-05-13 DIAGNOSIS — F1090 Alcohol use, unspecified, uncomplicated: Secondary | ICD-10-CM | POA: Diagnosis not present

## 2024-05-13 DIAGNOSIS — R45851 Suicidal ideations: Secondary | ICD-10-CM | POA: Diagnosis not present

## 2024-05-13 DIAGNOSIS — E11649 Type 2 diabetes mellitus with hypoglycemia without coma: Secondary | ICD-10-CM | POA: Diagnosis not present

## 2024-05-13 DIAGNOSIS — F319 Bipolar disorder, unspecified: Secondary | ICD-10-CM | POA: Diagnosis not present

## 2024-05-13 LAB — GLUCOSE, CAPILLARY
Glucose-Capillary: 107 mg/dL — ABNORMAL HIGH (ref 70–99)
Glucose-Capillary: 160 mg/dL — ABNORMAL HIGH (ref 70–99)
Glucose-Capillary: 321 mg/dL — ABNORMAL HIGH (ref 70–99)
Glucose-Capillary: 267 mg/dL — ABNORMAL HIGH (ref 70–99)

## 2024-05-13 LAB — VITAMIN B12: Vitamin B-12: 393 pg/mL (ref 180–914)

## 2024-05-13 LAB — TSH: TSH: 1.238 u[IU]/mL (ref 0.350–4.500)

## 2024-05-13 MED ORDER — AZITHROMYCIN 250 MG PO TABS
250.0000 mg | ORAL_TABLET | Freq: Every day | ORAL | Status: DC
  Administered 2024-05-14: 250 mg via ORAL
  Filled 2024-05-13: qty 1

## 2024-05-13 MED ORDER — NALTREXONE HCL 50 MG PO TABS
50.0000 mg | ORAL_TABLET | Freq: Every day | ORAL | Status: DC
  Administered 2024-05-13: 50 mg via ORAL
  Filled 2024-05-13 (×2): qty 1
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
## 2024-05-13 NOTE — Progress Notes (Signed)
 Progress Note   Patient: Kelly Dillon FMW:[id number] DOB: 02/27/1961 DOA: 05/10/2024     2 DOS: the patient was seen and examined on 05/13/2024   Brief hospital course: 63 y.o. year old female with past medical history of treatment resistant asthma, tobacco use disorder, ILD, COPD with chronic hypoxic respiratory failure on 3 L O2 at home, obstructive sleep apnea's, type 2 diabetes mellitus, chronic pain, anxiety, depression with suicide attempt in 2013, fibromyalgia, diverticulitis, coronary artery disease, hypertension.  She presents to [facility] ED with suicidal ideation and alcohol abuse.  She was initially awaiting psychiatric consultation however during reassessment by ED physician it was noted that patient was wheezing and had mildly increased work of breathing with associated dyspnea.  On my interview she endorses productive cough with green sputum and increased dyspnea that started Thursday evening.  She denies fever, chest pain, fatigue, nausea, vomiting or diarrhea.  As far as her psychiatric she endorses thoughts of wanting to harm herself as well as her daughter's boyfriend citing he had something to do with her daughter being sent to jail.  She states that she thinks about harming him at baseline but when she drinks there is a voice in her head that gives her the encouraged to want to do something about it and the voice tells her to go after him when she sees him.  She reports she has not acted on this.  She reports until recently she had been sober from alcohol for 20 years but had a recent relapse and is now drinking 1 gallon of tequila every weekend over 3-day period.  She endorses visual hallucinations at baseline that is worsened when she drinks, often seeing bugs crawling on the ground.  She also endorses a feeling or that someone is watching her.  Lastly patient endorses being psychologically distressed by the current climate of immigrants being detained and  deported.  [facility] in person Spanish interpreter utilized for translation during this interview.   6/22.  Patient sometimes gets numbness in her extremities and having some difficulty with her left shoulder.  States her breathing is better but still having some wheezing. 6/23.  Patient's left shoulder is doing better.  Patient's breathing is better.  Assessment and Plan: * COPD with acute exacerbation (HCC) Continue Solu-Medrol .  Continue nebulizer treatments.  On empiric antibiotics.  Bipolar 1 disorder (HCC) Appreciate psychiatric follow-up.  Abilify  increased to 10 mg twice a day and started on lithium .  Psychiatry team would like to monitor another day and reassess tomorrow.  Currently no psychiatric beds.  Alcohol use disorder Alcohol withdrawal protocol.  No signs of withdrawal currently.  Suicidal ideation Psychiatry team following  Uncontrolled type 2 diabetes mellitus with hypoglycemia, with long-term current use of insulin  (HCC) Patient on Semglee  insulin  and sliding scale.  Last hemoglobin A1c 6.0.  Sugars elevated with steroids.  Hyperlipidemia On Lipitor  Essential hypertension On losartan   GERD (gastroesophageal reflux disease) On Protonix   Chronic left shoulder pain Improve range of motion left shoulder today with steroids and Robaxin .  Neuropathy 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  Continue Lyrica .  Obesity, Class III, BMI 40-49.9 (morbid obesity) BMI 40.93.  Chronic respiratory failure with hypoxia (HCC) Chronically wears 2 L of oxygen.  Hypokalemia Replaced        Subjective: Patient feeling better.  He is breathing little bit better.  Her mood is little bit better.  She is moving her left shoulder better.  Admitted with COPD exacerbation.  Physical Exam: Vitals:   05/12/24 2215 05/13/24 0537 05/13/24 0737 05/13/24 1551  BP:  (!) 150/87 (!) 159/97 (!) 156/86  Pulse:  66 64 73  Resp:  18 18 18   Temp: 98 F (36.7 C) 98.7 F (37.1  C) 97.8 F (36.6 C) 98.5 F (36.9 C)  TempSrc: Oral Oral  Oral  SpO2:  100% 100% 98%  Weight:      Height:       Physical Exam HENT:     Head: Normocephalic.     Mouth/Throat:     Pharynx: No oropharyngeal exudate.   Eyes:     General: Lids are normal.     Conjunctiva/sclera: Conjunctivae normal.    Cardiovascular:     Rate and Rhythm: Normal rate and regular rhythm.     Heart sounds: Normal heart sounds, S1 normal and S2 normal.  Pulmonary:     Breath sounds: Examination of the right-lower field reveals wheezing. Examination of the left-lower field reveals wheezing. Wheezing present. No decreased breath sounds.  Abdominal:     Palpations: Abdomen is soft.     Tenderness: There is no abdominal tenderness.   Musculoskeletal:     Right lower leg: No swelling.     Left lower leg: No swelling.     Comments: Patient able to lift her left arm overhead without any issue today.   Skin:    General: Skin is warm.     Findings: No rash.   Neurological:     Mental Status: She is alert.     Data Reviewed: Vitamin B12 393  Family Communication: Updated daughter on the phone  Disposition: Status is: Inpatient Remains inpatient appropriate because: Psychiatry to reevaluate to determine when she is psychiatrically ready to go home.  Planned Discharge Destination: Home depending on when psych clears her to go home    Time spent: 28 minutes  Author: Charlie Patterson, MD 05/13/2024 5:42 PM  For on call review [email or web address].

## 2024-05-13 NOTE — Progress Notes (Signed)
   05/13/24 1500  Spiritual Encounters  Type of Visit Follow up  Care provided to: Patient  Conversation partners present during encounter Nurse  Reason for visit Routine spiritual support  OnCall Visit No   Chaplain followed up with patient.  Chaplain visited earlier per Nurse suggestion but patient was being assisted by staff.  Patient shared some familial issues and things concerning them that caused her to worry and make choices that were not in her best interest.  Patient also shared that she is dealing with grief.  Patient's grandmother died this year and she is having trouble dealing with that.  Chaplain suggested patient consider place where cost effective group therapy may be an option.  Chaplain celebrated those things that the patient's grandmother has instilled in her like faith, love and cooking.  Patient has passed these things on to her children.  Chaplain offered prayer hearing that patient finds solace in her faith.  Chaplain prayed with patient and let her know Chaplains are here to assist if needed.    Rev. Rana M. Nicholaus, M.Div. Chaplain Resident [facility]

## 2024-05-13 NOTE — Progress Notes (Signed)
   05/13/24 1030  Spiritual Encounters  Type of Visit Attempt (pt unavailable)  Care provided to: Pt not available  Conversation partners present during encounter Nurse  Reason for visit Routine spiritual support  OnCall Visit No   Nurse shared that patient may benefit from a spiritual care visit but when Chaplain went to the room, other staff was at the bedside providing clinical care.  Chaplain excused herself and let Nurse know she would attempt a visit a little later in the day.    Rev. Rana M. Nicholaus, M.Div. Chaplain Resident [facility]

## 2024-05-13 NOTE — Plan of Care (Signed)

## 2024-05-13 NOTE — Evaluation (Signed)
 Occupational Therapy Evaluation Patient Details Name: Kelly Dillon MRN: [id number] DOB: 1961/08/21 Today's Date: 05/13/2024   History of Present Illness   Kelly Dillon is a 63yoF who comes [facility] on 05/10/24 with SI, ETOH abuse. PMH: BPD, asthma, tobacco use, ILD, COPD, CRF on 3L, OSA, DM2, chronic pain, GAD, depression, suicide attempt 2013, fibromylagia, CAD, HTN, ETOH in remission x20 years.     Clinical Impressions Upon entering the room, pt supine in bed with sitter present in room. Pt reports living at home alone and being Ind at baseline. She does endorse chronic pain in L shoulder but demonstrates ROM WFLs for all planes of movement and uses functionally during session without issue. OT does demonstrate shoulder stretches/mobility she can perform in room on her own. Use of video interpreter per pt request, although she talks to therapist in english during session. Pt performing LB clothing management, toileting, and ambulation without assistance in room. Pt pushing IV pole with P2 tank attached in hallway 200' with supervision overall. Pt does not need skilled acute OT intervention at this time. OT to complete orders.       Functional Status Assessment   Patient has not had a recent decline in their functional status     Equipment Recommendations   None recommended by OT      Precautions/Restrictions   Precautions Precautions: Fall Recall of Precautions/Restrictions: Intact Restrictions Weight Bearing Restrictions Per Provider Order: No     Mobility Bed Mobility Overal bed mobility: Modified Independent                  Transfers Overall transfer level: Modified independent Equipment used: Rolling walker (2 wheels)                      Balance Overall balance assessment: Modified Independent                                         ADL either performed or assessed with clinical judgement   ADL                                          General ADL Comments: mod I within room for mobility, LB dressing, and toilet needs. Pt pushes IV pole with O2 tank attached in hallway 200' with supervision for safety.     Vision Patient Visual Report: No change from baseline              Pertinent Vitals/Pain Pain Assessment Pain Assessment: No/denies pain     Extremity/Trunk Assessment Upper Extremity Assessment Upper Extremity Assessment: LUE deficits/detail LUE Deficits / Details: AROM WFLs but pain with shoulder flex that pt reports has been going on for years           Communication Communication Communication: No apparent difficulties   Cognition Arousal: Alert Behavior During Therapy: WFL for tasks assessed/performed Cognition: No apparent impairments                               Following commands: Intact       Cueing  General Comments   Cueing Techniques: Verbal cues              Home Living Family/patient  expects to be discharged to:: Private residence Living Arrangements: Alone Available Help at Discharge: Family;Available PRN/intermittently (daughter and granddaughter live nearby) Type of Home: Mobile home Home Access: Stairs to enter Entrance Stairs-Number of Steps: 2-3 stairs   Home Layout: One level     Bathroom Shower/Tub: Tub/shower unit         Home Equipment: Shower seat          Prior Functioning/Environment Prior Level of Function : Driving;Independent/Modified Independent                            OT Goals(Current goals can be found in the care plan section)   Acute Rehab OT Goals Patient Stated Goal: to go home Time For Goal Achievement: 05/13/24 Potential to Achieve Goals: Fair   AM-PAC OT 6 Clicks Daily Activity     Outcome Measure Help from another person eating meals?: None Help from another person taking care of personal grooming?: None Help from another person toileting, which includes using  toliet, bedpan, or urinal?: None Help from another person bathing (including washing, rinsing, drying)?: None Help from another person to put on and taking off regular upper body clothing?: None Help from another person to put on and taking off regular lower body clothing?: None 6 Click Score: 24   End of Session Equipment Utilized During Treatment: Oxygen Nurse Communication: Mobility status  Activity Tolerance: Patient tolerated treatment well Patient left: in bed;with nursing/sitter in room                   Time: 1020-1047 OT Time Calculation (min): 27 min Charges:  OT General Charges $OT Visit: 1 Visit OT Evaluation $OT Eval Low Complexity: 1 Low OT Treatments $Self Care/Home Management : 8-22 mins  Izetta Claude, MS, OTR/L , CBIS ascom [phone]  05/13/24, 12:36 PM

## 2024-05-14 ENCOUNTER — Other Ambulatory Visit: Payer: Self-pay

## 2024-05-14 DIAGNOSIS — F1090 Alcohol use, unspecified, uncomplicated: Secondary | ICD-10-CM | POA: Diagnosis not present

## 2024-05-14 DIAGNOSIS — F319 Bipolar disorder, unspecified: Secondary | ICD-10-CM | POA: Diagnosis not present

## 2024-05-14 DIAGNOSIS — F3162 Bipolar disorder, current episode mixed, moderate: Secondary | ICD-10-CM | POA: Diagnosis not present

## 2024-05-14 DIAGNOSIS — E1165 Type 2 diabetes mellitus with hyperglycemia: Secondary | ICD-10-CM

## 2024-05-14 DIAGNOSIS — R45851 Suicidal ideations: Secondary | ICD-10-CM | POA: Diagnosis not present

## 2024-05-14 DIAGNOSIS — J441 Chronic obstructive pulmonary disease with (acute) exacerbation: Secondary | ICD-10-CM | POA: Diagnosis not present

## 2024-05-14 LAB — GLUCOSE, CAPILLARY
Glucose-Capillary: 95 mg/dL (ref 70–99)
Glucose-Capillary: 155 mg/dL — ABNORMAL HIGH (ref 70–99)

## 2024-05-14 MED ORDER — PREDNISONE 20 MG PO TABS
40.0000 mg | ORAL_TABLET | Freq: Every day | ORAL | 0 refills | Status: AC
  Filled 2024-05-14: qty 4, 2d supply, fill #0

## 2024-05-14 MED ORDER — TRESIBA FLEXTOUCH 100 UNIT/ML ~~LOC~~ SOPN: 2.0000 [IU] | PEN_INJECTOR | Freq: Every day | SUBCUTANEOUS | Status: AC

## 2024-05-14 MED ORDER — NICOTINE 14 MG/24HR TD PT24
14.0000 mg | MEDICATED_PATCH | Freq: Every day | TRANSDERMAL | 0 refills | Status: DC
  Filled 2024-05-14 (×2): qty 28, 28d supply, fill #0

## 2024-05-14 MED ORDER — ARIPIPRAZOLE 10 MG PO TABS
10.0000 mg | ORAL_TABLET | Freq: Two times a day (BID) | ORAL | 0 refills | Status: AC
  Filled 2024-05-14: qty 60, 30d supply, fill #0

## 2024-05-14 MED ORDER — METHOCARBAMOL 500 MG PO TABS
500.0000 mg | ORAL_TABLET | Freq: Three times a day (TID) | ORAL | 0 refills | Status: DC
  Filled 2024-05-14: qty 90, 30d supply, fill #0

## 2024-05-14 MED ORDER — THIAMINE HCL 100 MG PO TABS
100.0000 mg | ORAL_TABLET | Freq: Every day | ORAL | 0 refills | Status: DC
  Filled 2024-05-14: qty 30, 30d supply, fill #0

## 2024-05-14 MED ORDER — PREGABALIN 200 MG PO CAPS
200.0000 mg | ORAL_CAPSULE | Freq: Three times a day (TID) | ORAL | 0 refills | Status: DC
  Filled 2024-05-14: qty 90, 30d supply, fill #0

## 2024-05-14 MED ORDER — TRELEGY ELLIPTA 100-62.5-25 MCG/ACT IN AEPB
1.0000 | INHALATION_SPRAY | Freq: Every day | RESPIRATORY_TRACT | 0 refills | Status: DC
  Filled 2024-05-14: qty 60, 30d supply, fill #0

## 2024-05-14 MED ORDER — LITHIUM CARBONATE ER 300 MG PO TBCR
300.0000 mg | EXTENDED_RELEASE_TABLET | Freq: Two times a day (BID) | ORAL | 0 refills | Status: AC
  Filled 2024-05-14: qty 60, 30d supply, fill #0

## 2024-05-14 MED ORDER — HYDROCODONE-ACETAMINOPHEN 5-325 MG PO TABS
1.0000 | ORAL_TABLET | Freq: Four times a day (QID) | ORAL | 0 refills | Status: DC | PRN
  Filled 2024-05-14: qty 10, 3d supply, fill #0

## 2024-05-14 MED ORDER — FOLIC ACID 1 MG PO TABS
1.0000 mg | ORAL_TABLET | Freq: Every day | ORAL | 0 refills | Status: AC
  Filled 2024-05-14: qty 30, 30d supply, fill #0

## 2024-05-14 NOTE — Assessment & Plan Note (Signed)
 Patient on Semglee  insulin  and sliding scale.  Last hemoglobin A1c 6.0.  Sugars elevated with steroids.  Patient states she has her insulin  at home.
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
## 2024-05-14 NOTE — Discharge Summary (Addendum)
 Physician Discharge Summary   Patient: Kelly Dillon MRN: [id number] DOB: 02/05/1961  Admit date:     05/10/2024  Discharge date: 05/14/24  Discharge Physician: Charlie Patterson   PCP: Sula Sigrid Kays  Recommendations at discharge:   Follow-up PCP 5 days Follow-up with your psychiatrist  Discharge Diagnoses: Principal Problem:   COPD with acute exacerbation (HCC) Active Problems:   Alcohol use disorder   Bipolar 1 disorder (HCC)   Suicidal ideation   Uncontrolled type 2 diabetes mellitus with hyperglycemia, with long-term current use of insulin  (HCC)   Hyperlipidemia   Essential hypertension   GERD (gastroesophageal reflux disease)   Chronic left shoulder pain   Depression, major, recurrent, moderate (HCC)   Hypokalemia   Chronic respiratory failure with hypoxia (HCC)   Severe episode of recurrent major depressive disorder, without psychotic features (HCC)   Homicidal thoughts   COPD exacerbation (HCC)   Obesity, Class III, BMI 40-49.9 (morbid obesity)   Neuropathy   Hospital Course: 63 y.o. year old female with past medical history of treatment resistant asthma, tobacco use disorder, ILD, COPD with chronic hypoxic respiratory failure on 3 L O2 at home, obstructive sleep apnea's, type 2 diabetes mellitus, chronic pain, anxiety, depression with suicide attempt in 2013, fibromyalgia, diverticulitis, coronary artery disease, hypertension.  She presents to [facility] ED with suicidal ideation and alcohol abuse.  She was initially awaiting psychiatric consultation however during reassessment by ED physician it was noted that patient was wheezing and had mildly increased work of breathing with associated dyspnea.  On my interview she endorses productive cough with green sputum and increased dyspnea that started Thursday evening.  She denies fever, chest pain, fatigue, nausea, vomiting or diarrhea.  As far as her psychiatric she endorses thoughts of wanting  to harm herself as well as her daughter's boyfriend citing he had something to do with her daughter being sent to jail.  She states that she thinks about harming him at baseline but when she drinks there is a voice in her head that gives her the encouraged to want to do something about it and the voice tells her to go after him when she sees him.  She reports she has not acted on this.  She reports until recently she had been sober from alcohol for 20 years but had a recent relapse and is now drinking 1 gallon of tequila every weekend over 3-day period.  She endorses visual hallucinations at baseline that is worsened when she drinks, often seeing bugs crawling on the ground.  She also endorses a feeling or that someone is watching her.  Lastly patient endorses being psychologically distressed by the current climate of immigrants being detained and deported.  [facility] in person Spanish interpreter utilized for translation during this interview.   6/22.  Patient sometimes gets numbness in her extremities and having some difficulty with her left shoulder.  States her breathing is better but still having some wheezing. 6/23.  Patient's left shoulder is doing better.  Patient's breathing is better. 6/24.  Psychiatry cleared to go home.  Medically stable for discharge.  Assessment and Plan: * COPD with acute exacerbation (HCC) Patient given Solu-Medrol  here will give prednisone  for 2 more days upon going home.  Refill Trelegy inhaler.  Has her albuterol  inhaler  Bipolar 1 disorder (HCC) Appreciate psychiatric follow-up.  Abilify  increased to 10 mg twice a day and started on lithium .  Psychiatry team cleared to go home.  Patient will need psychiatric follow-up  as outpatient with new medications.  Alcohol use disorder No signs of withdrawal during hospital stay  Suicidal ideation Psychiatry team cleared to go home  Uncontrolled type 2 diabetes mellitus with hyperglycemia, with long-term current use of insulin   (HCC) Patient on Semglee  insulin  and sliding scale.  Last hemoglobin A1c 6.0.  Sugars elevated with steroids.  Patient states she has her insulin  at home.  Hyperlipidemia On Lipitor  Essential hypertension On losartan   GERD (gastroesophageal reflux disease) On Protonix   Chronic left shoulder pain Improve range of motion left shoulder today with steroids and Robaxin .  Neuropathy 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  Continue Lyrica .  Obesity, Class III, BMI 40-49.9 (morbid obesity) BMI 40.93.  Chronic respiratory failure with hypoxia (HCC) Chronically wears 2 L of oxygen.  Hypokalemia Replaced         Consultants: Psychiatry Procedures performed: None Disposition: Home Diet recommendation:  Cardiac and Carb modified diet DISCHARGE MEDICATION: Allergies as of 05/14/2024   No Known Allergies      Medication List     STOP taking these medications    benzonatate  100 MG capsule Commonly known as: Tessalon  Perles   celecoxib  200 MG capsule Commonly known as: CELEBREX    escitalopram 5 MG tablet Commonly known as: LEXAPRO   HumaLOG  KwikPen 200 UNIT/ML KwikPen Generic drug: insulin  lispro   hydrOXYzine  50 MG tablet Commonly known as: ATARAX    insulin  glargine 100 unit/mL Sopn Commonly known as: LANTUS    levothyroxine 25 MCG tablet Commonly known as: SYNTHROID   loperamide 2 MG capsule Commonly known as: IMODIUM   melatonin 5 MG Tabs   metFORMIN  1000 MG tablet Commonly known as: GLUCOPHAGE    mupirocin  cream 2 % Commonly known as: BACTROBAN    ondansetron  4 MG disintegrating tablet Commonly known as: ZOFRAN -ODT   oxybutynin  5 MG tablet Commonly known as: DITROPAN    oxyCODONE -acetaminophen  7.5-325 MG tablet Commonly known as: PERCOCET   traZODone  100 MG tablet Commonly known as: DESYREL    trospium  20 MG tablet Commonly known as: SANCTURA    varenicline  1 MG tablet Commonly known as: CHANTIX    venlafaxine  XR 150 MG 24 hr  capsule Commonly known as: EFFEXOR -XR       TAKE these medications    Accu-Chek Guide Me w/Device Kit Use to check blood glucose three times daily before meals and at bedtime   Accu-Chek Guide test strip Generic drug: glucose blood Use to check blood glucose before meals and at bedtime   albuterol  (2.5 MG/3ML) 0.083% nebulizer solution Commonly known as: PROVENTIL  Inhale 3 mLs into the lungs every 6 (six) hours as needed.   ARIPiprazole  10 MG tablet Commonly known as: ABILIFY  Tome 1 tableta (10 mg en total) por va oral 2 (dos) veces al [employer]. (Take 1 tablet (10 mg total) by mouth 2 (two) times daily.) What changed:  medication strength how much to take when to take this   atorvastatin  40 MG tablet Commonly known as: LIPITOR Take 1 tablet (40 mg total) by mouth daily.   Centrum Silver 50+Women Tabs Take 1 capsule by mouth See admin instructions.   Comfort EZ Pen Needles 32G X 4 MM Misc Generic drug: Insulin  Pen Needle 1 Needle in the morning, at noon, in the evening, and at bedtime.   folic acid  1 MG tablet Commonly known as: FOLVITE  Tome 1 tableta (1 mg en total) por va oral diariamente. (Take 1 tablet (1 mg total) by mouth daily.)   HYDROcodone -acetaminophen  5-325 MG tablet Commonly known as: NORCO/VICODIN Tome 1  tableta por va oral cada 6 (seis) horas segn sea necesario para el dolor intenso (puntaje de dolor 7-10). (Take 1 tablet by mouth every 6 (six) hours as needed for severe pain (pain score 7-10).)   Ipratropium-Albuterol  20-100 MCG/ACT Aers respimat Commonly known as: COMBIVENT  Inhale 1 puff into the lungs every 6 (six) hours as needed.   lithium  carbonate 300 MG ER tablet Commonly known as: LITHOBID  Tome 1 tableta (300 mg en total) por va oral cada 12 (doce) horas. (Take 1 tablet (300 mg total) by mouth every 12 (twelve) hours.)   losartan  50 MG tablet Commonly known as: COZAAR  Take 1 tablet (50 mg total) by mouth daily.   methocarbamol  500  MG tablet Commonly known as: ROBAXIN  Tome 1 tableta (500 mg en total) por va oral 3 (tres) veces al [employer]. (Take 1 tablet (500 mg total) by mouth 3 (three) times daily.)   montelukast  10 MG tablet Commonly known as: SINGULAIR  Take 1 tablet by mouth daily.   nicotine  14 mg/24hr patch Commonly known as: NICODERM CQ  - dosed in mg/24 hours Coloque 1 parche (14 mg en total) sobre la piel diariamente. (Place 1 patch (14 mg total) onto the skin daily.)   nitroGLYCERIN  0.4 MG SL tablet Commonly known as: Nitrostat  Place 1 tablet (0.4 mg total) under the tongue every 5 (five) minutes as needed for chest pain.   NovoLOG  FlexPen 100 UNIT/ML FlexPen Generic drug: insulin  aspart Inject 7 Units into the skin 3 (three) times daily with meals.   onetouch ultrasoft lancets Use to check blood sugars twice daily.   OXYGEN 3LPM   predniSONE  20 MG tablet Commonly known as: DELTASONE  Tome 2 comprimidos (40 mg en total) por va oral diariamente durante 2 das. (Take 2 tablets (40 mg total) by mouth daily for 2 days.) Start taking on: May 15, 2024   pregabalin  200 MG capsule Commonly known as: Lyrica  Tome 1 cpsula (200 mg en total) por va oral por la maana, al medioda y antes de [place]. (Take 1 capsule (200 mg total) by mouth in the morning, at noon, and at bedtime.)   thiamine  100 MG tablet Commonly known as: VITAMIN B1 Tome 1 tableta (100 mg en total) por va oral diariamente. (Take 1 tablet (100 mg total) by mouth daily.)   Trelegy Ellipta  100-62.5-25 MCG/ACT Aepb Generic drug: Fluticasone -Umeclidin-Vilant Inhalar 1 inhalacin a los pulmones diariamente. (Inhale 1 Inhalation into the lungs daily.)   Tresiba FlexTouch 100 UNIT/ML FlexTouch Pen Generic drug: insulin  degludec Inject 2 Units into the skin at bedtime. What changed: how much to take        Follow-up Information     your psychiatrist Follow up in 1 week(s).          Sigrid Deidra Fox, MD Follow up  today.   Specialty: Family Medicine Why: keep appointment friday Contact information: [address] [place] [address] [phone]                Discharge Exam: Fredricka Weights   05/10/24 1931 05/11/24 0836  Weight: 115 kg 85.8 kg   Physical Exam HENT:     Head: Normocephalic.     Mouth/Throat:     Pharynx: No oropharyngeal exudate.   Eyes:     General: Lids are normal.     Conjunctiva/sclera: Conjunctivae normal.    Cardiovascular:     Rate and Rhythm: Normal rate and regular rhythm.     Heart sounds: Normal heart sounds, S1 normal  and S2 normal.  Pulmonary:     Breath sounds: Examination of the right-lower field reveals decreased breath sounds. Examination of the left-lower field reveals decreased breath sounds. Decreased breath sounds present. No wheezing.  Abdominal:     Palpations: Abdomen is soft.     Tenderness: There is no abdominal tenderness.   Musculoskeletal:     Right lower leg: No swelling.     Left lower leg: No swelling.     Comments: Patient able to lift her left arm overhead without any issue today.   Skin:    General: Skin is warm.     Findings: No rash.   Neurological:     Mental Status: She is alert.      Condition at discharge: stable  The results of significant diagnostics from this hospitalization (including imaging, microbiology, ancillary and laboratory) are listed below for reference.   Imaging Studies: DG Chest Portable 1 View Result Date: 05/11/2024 EXAM: 1 VIEW XRAY OF THE CHEST 05/11/2024 05:11:00 AM COMPARISON: 2-view chest x-ray 11/28/2023. CLINICAL HISTORY: Hypoxia. Patient is a 63 year old female presenting to [facility] ED under IVC. Per triage note Pt arrives seeking treatment for alcohol. FINDINGS: LUNGS AND PLEURA: The lung volumes are low. Changes of COPD are present. Mild atelectasis is present at the lung bases. No focal pulmonary opacity. No pulmonary edema. No pleural effusion. No pneumothorax. HEART AND  MEDIASTINUM: No acute abnormality of the cardiac and mediastinal silhouettes. BONES AND SOFT TISSUES: No acute osseous abnormality. IMPRESSION: 1. Low lung volumes and mild basilar atelectasis. 2. Changes of COPD. Electronically signed by: Lonni Necessary MD 05/11/2024 06:13 AM EDT RP Workstation: [id number]    Microbiology: Results for orders placed or performed during the hospital encounter of 05/10/24  Resp panel by RT-PCR (RSV, Flu A&B, Covid) Anterior Nasal Swab     Status: None   Collection Time: 05/10/24 10:24 PM   Specimen: Anterior Nasal Swab  Result Value Ref Range Status   SARS Coronavirus 2 by RT PCR NEGATIVE NEGATIVE Final    Comment: (NOTE) SARS-CoV-2 target nucleic acids are NOT DETECTED.  The SARS-CoV-2 RNA is generally detectable in upper respiratory specimens during the acute phase of infection. The lowest concentration of SARS-CoV-2 viral copies this assay can detect is 138 copies/mL. A negative result does not preclude SARS-Cov-2 infection and should not be used as the sole basis for treatment or other patient management decisions. A negative result may occur with  improper specimen collection/handling, submission of specimen other than nasopharyngeal swab, presence of viral mutation(s) within the areas targeted by this assay, and inadequate number of viral copies(<138 copies/mL). A negative result must be combined with clinical observations, patient history, and epidemiological information. The expected result is Negative.  Fact Sheet for Patients:  BloggerCourse.com  Fact Sheet for Healthcare Providers:  SeriousBroker.it  This test is no t yet approved or cleared by the [place]  FDA and  has been authorized for detection and/or diagnosis of SARS-CoV-2 by FDA under an Emergency Use Authorization (EUA). This EUA will remain  in effect (meaning this test can be used) for the duration of the COVID-19  declaration under Section 564(b)(1) of the Act, 21 U.S.C.section 360bbb-3(b)(1), unless the authorization is terminated  or revoked sooner.       Influenza A by PCR NEGATIVE NEGATIVE Final   Influenza B by PCR NEGATIVE NEGATIVE Final    Comment: (NOTE) The Xpert Xpress SARS-CoV-2/FLU/RSV plus assay is intended as an aid in the diagnosis of influenza  from Nasopharyngeal swab specimens and should not be used as a sole basis for treatment. Nasal washings and aspirates are unacceptable for Xpert Xpress SARS-CoV-2/FLU/RSV testing.  Fact Sheet for Patients: BloggerCourse.com  Fact Sheet for Healthcare Providers: SeriousBroker.it  This test is not yet approved or cleared by the [place]  FDA and has been authorized for detection and/or diagnosis of SARS-CoV-2 by FDA under an Emergency Use Authorization (EUA). This EUA will remain in effect (meaning this test can be used) for the duration of the COVID-19 declaration under Section 564(b)(1) of the Act, 21 U.S.C. section 360bbb-3(b)(1), unless the authorization is terminated or revoked.     Resp Syncytial Virus by PCR NEGATIVE NEGATIVE Final    Comment: (NOTE) Fact Sheet for Patients: BloggerCourse.com  Fact Sheet for Healthcare Providers: SeriousBroker.it  This test is not yet approved or cleared by the [place]  FDA and has been authorized for detection and/or diagnosis of SARS-CoV-2 by FDA under an Emergency Use Authorization (EUA). This EUA will remain in effect (meaning this test can be used) for the duration of the COVID-19 declaration under Section 564(b)(1) of the Act, 21 U.S.C. section 360bbb-3(b)(1), unless the authorization is terminated or revoked.  Performed at [facility], [address] [place], [place] [address]     Labs: CBC: Recent Labs  Lab 05/10/24 1948 05/11/24 1050 05/12/24 0506   WBC 10.8* 3.1* 6.3  HGB 15.4* 12.3 12.3  HCT 45.9 36.7 37.1  MCV 89.0 90.6 90.3  PLT 268 174 169   Basic Metabolic Panel: Recent Labs  Lab 05/10/24 1948 05/11/24 1050 05/12/24 0506  NA 139 137 135  K 2.7* 3.7 4.2  CL 101 106 105  CO2 25 22 26   GLUCOSE 156* 311* 186*  BUN 8 9 12   CREATININE 0.71 0.72 0.64  CALCIUM  9.1 7.8* 8.8*  MG 2.3  --   --    Liver Function Tests: Recent Labs  Lab 05/10/24 1948  AST 57*  ALT 32  ALKPHOS 108  BILITOT 0.8  PROT 8.4*  ALBUMIN 3.9   CBG: Recent Labs  Lab 05/13/24 1104 05/13/24 1613 05/13/24 2106 05/14/24 0728 05/14/24 1201  GLUCAP 160* 321* 267* 95 155*    Discharge time spent: greater than 30 minutes.  Signed: Charlie Patterson, MD [facility] 05/14/2024

## 2024-05-15 ENCOUNTER — Telehealth: Payer: Self-pay

## 2024-05-15 NOTE — Transitions of Care (Post Inpatient/ED Visit) (Signed)
 05/15/2024  Name: Kelly Dillon MRN: [id number] DOB: [birth year]  Today's TOC FU Call Status: Today's TOC FU Call Status:: Successful TOC FU Call Completed TOC FU Call Complete Date: 05/15/24 Patient's Name and Date of Birth confirmed.  Transition Care Management Follow-up Telephone Call Date of Discharge: 05/14/24 Discharge Facility: [facility]) Type of Discharge: Inpatient Admission Primary Inpatient Discharge Diagnosis:: COPD exacerbation How have you been since you were released from the hospital?: Better Any questions or concerns?: Yes Patient Questions/Concerns:: She is unable to afford Nicotine  patches. Patient Questions/Concerns Addressed: Other: (Pharmacy referral)  Items Reviewed: Did you receive and understand the discharge instructions provided?: Yes Medications obtained,verified, and reconciled?: Yes (Medications Reviewed) (She does not have Nicotine  patches due to her insurance does not cover) Any new allergies since your discharge?: No Dietary orders reviewed?: Yes Type of Diet Ordered:: Diabetic Do you have support at home?: Yes People in Home [RPT]: alone Name of Support/Comfort Primary Source: Kelly Dillon, daughter  Medications Reviewed Today: Medications Reviewed Today     Reviewed by Moises Reusing, RN (Case Manager) on 05/15/24 at 1129  Med List Status: <None>   Medication Order Taking? Sig Documenting Provider Last Dose Status Informant  albuterol  (PROVENTIL ) (2.5 MG/3ML) 0.083% nebulizer solution [id number] Yes Inhale 3 mLs into the lungs every 6 (six) hours as needed. [place], Hospital doctor, NP  Active Pharmacy Records, Multiple Informants, Other           Med Note GROVER BURNARD RAMAN   Sat May 11, 2024 10:47 AM) PRN  ARIPiprazole  (ABILIFY ) 10 MG tablet [id number] Yes Take 1 tablet (10 mg total) by mouth 2 (two) times daily. Josette Ade, MD  Active   atorvastatin  (LIPITOR) 40 MG tablet [id number] Yes Take 1 tablet (40 mg  total) by mouth daily. Carin Gauze, NP  Active Pharmacy Records, Multiple Informants, Other           Med Note GROVER BURNARD RAMAN   Sat May 11, 2024 10:47 AM) 02/22/2024 40 MG TABS (disp 90, 90d supply)  Blood Glucose Monitoring Suppl (ACCU-CHEK GUIDE ME) w/Device KIT [id number] Yes Use to check blood glucose three times daily before meals and at bedtime Orlean Alan HERO, FNP  Active Pharmacy Records, Multiple Informants, Other  COMFORT EZ PEN NEEDLES 32G X 4 MM MISC [id number] Yes 1 Needle in the morning, at noon, in the evening, and at bedtime. [provider]  Active Pharmacy Records, Multiple Informants, Other  Fluticasone -Umeclidin-Vilant (TRELEGY ELLIPTA ) 100-62.5-25 MCG/ACT AEPB [id number] Yes Inhale 1 Inhalation into the lungs daily. Josette Ade, MD  Active   folic acid  (FOLVITE ) 1 MG tablet [id number] Yes Take 1 tablet (1 mg total) by mouth daily. Josette Ade, MD  Active   glucose blood (ACCU-CHEK GUIDE) test strip [id number] Yes Use to check blood glucose before meals and at bedtime Orlean Alan HERO, FNP  Active Pharmacy Records, Multiple Informants, Other  HYDROcodone -acetaminophen  (NORCO/VICODIN) 5-325 MG tablet [id number] Yes Take 1 tablet by mouth every 6 (six) hours as needed for severe pain (pain score 7-10). Josette Ade, MD  Active   Ipratropium-Albuterol  (COMBIVENT ) 20-100 MCG/ACT AERS respimat [id number] Yes Inhale 1 puff into the lungs every 6 (six) hours as needed. Carin Gauze, NP  Active Pharmacy Records, Multiple Informants, Other           Med Note GROVER BURNARD RAMAN   Sat May 11, 2024 10:52 AM)   02/22/2024 0.5-2.5 (3) MG/3ML SOLN (disp 270, 15d supply)    Lancets (ONETOUCH ULTRASOFT) lancets  [id number] Yes Use to check blood sugars twice daily. Orlean Alan HERO, FNP  Active Pharmacy Records, Multiple Informants, Other  lithium  carbonate (LITHOBID ) 300 MG ER tablet [id number] Yes Take 1 tablet (300 mg total) by mouth every 12 (twelve) hours.  Josette Ade, MD  Active   losartan  (COZAAR ) 50 MG tablet [id number] Yes Take 1 tablet (50 mg total) by mouth daily. Orlean Alan HERO, FNP  Active Pharmacy Records, Multiple Informants, Other           Med Note GROVER BURNARD RAMAN   Sat May 11, 2024 10:53 AM) 04/23/2024 50 MG TABS (disp 90, 90d supply)  methocarbamol  (ROBAXIN ) 500 MG tablet [id number] Yes Take 1 tablet (500 mg total) by mouth 3 (three) times daily. Josette Ade, MD  Active   montelukast  (SINGULAIR ) 10 MG tablet [id number] Yes Take 1 tablet by mouth daily. [provider]  Active Pharmacy Records, Multiple Informants, Other           Med Note GROVER BURNARD RAMAN   Sat May 11, 2024 10:54 AM)   05/10/2024 10 MG TABS (disp 90, 90d supply)    Multiple Vitamins-Minerals (CENTRUM SILVER 50+WOMEN) TABS [id number] Yes Take 1 capsule by mouth See admin instructions. [provider]  Active Pharmacy Records, Multiple Informants, Other  nicotine  (NICODERM CQ  - DOSED IN MG/24 HOURS) 14 mg/24hr patch [id number]  Place 1 patch (14 mg total) onto the skin daily.  Patient not taking: Reported on 05/15/2024   Josette Ade, MD  Active   nitroGLYCERIN  (NITROSTAT ) 0.4 MG SL tablet [id number] Yes Place 1 tablet (0.4 mg total) under the tongue every 5 (five) minutes as needed for chest pain. Carin Gauze, NP  Active Pharmacy Records, Multiple Informants, Other           Med Note GROVER BURNARD RAMAN   Sat May 11, 2024 10:55 AM) PRN  NOVOLOG  FLEXPEN 100 UNIT/ML FlexPen [id number] Yes Inject 7 Units into the skin 3 (three) times daily with meals. [provider]  Active Pharmacy Records, Multiple Informants, Other           Med Note GROVER BURNARD RAMAN   Sat May 11, 2024 10:55 AM) 02/26/2024 100 UNIT/ML SOPN (disp 15, 90d supply  OXYGEN [id number] Yes 3LPM [provider]  Active Pharmacy Records, Multiple Informants, Other  predniSONE  (DELTASONE ) 20 MG tablet [id number] Yes Take 2 tablets (40 mg total) by mouth  daily for 2 days. Josette Ade, MD  Active   pregabalin  (LYRICA ) 200 MG capsule [id number] Yes Take 1 capsule (200 mg total) by mouth in the morning, at noon, and at bedtime. Josette Ade, MD  Active   thiamine  (VITAMIN B1) 100 MG tablet [id number] Yes Take 1 tablet (100 mg total) by mouth daily. Josette Ade, MD  Active   TRESIBA FLEXTOUCH 100 UNIT/ML FlexTouch Pen [id number] Yes Inject 2 Units into the skin at bedtime. Josette Ade, MD  Active             Home Care and Equipment/Supplies: Were Home Health Services Ordered?: No Any new equipment or medical supplies ordered?: No  Functional Questionnaire: Do you need assistance with bathing/showering or dressing?: No Do you need assistance with meal preparation?: No Do you need assistance with eating?: No Do you have difficulty maintaining continence: No Do you need assistance with getting out of bed/getting out of a chair/moving?: No Do you have difficulty managing or taking your medications?: No  Follow up appointments reviewed: PCP Follow-up appointment confirmed?: No (Instructed the patient  to call and schedule a follow up appointment) MD Provider Line [phone] Given: No Specialist Hospital Follow-up appointment confirmed?: Yes Date of Specialist follow-up appointment?: 06/11/24 Follow-Up Specialty Provider:: Dedra Sanders Reason Specialist Follow-Up Not Confirmed: Patient has Specialist Provider Number and will Call for Appointment Do you need transportation to your follow-up appointment?: No (Her daughter will transport) Do you understand care options if your condition(s) worsen?: Yes-patient verbalized understanding  SDOH Interventions Today    Flowsheet Row Most Recent Value  SDOH Interventions   Food Insecurity Interventions Other (Comment)  [Referral for SDOH]  Housing Interventions Other (Comment)  [The patient has been given community resources in the past 6 months]  Transportation  Interventions Intervention Not Indicated  Utilities Interventions Intervention Not Indicated    Medford Balboa, BSN, RN [place]  [facility] - Population Health RN Care Manager [phone]

## 2024-05-15 NOTE — Patient Instructions (Signed)
 Visit Information  Thank you for taking time to visit with me today. Please don't hesitate to contact me if I can be of assistance to you before our next scheduled telephone appointment.  Our next appointment is by telephone on Wednesday July 2 at 10:30am  Following is a copy of your care plan:   Goals Addressed             This Visit's Progress    VBCI Transitions of Care (TOC) Care Plan       Problems:  Recent Hospitalization for treatment of COPD Medication access barrier Insurance does not pay for Nicotine  patches and SDOH barrier: The patient lives in a trailer with no air conditioning  Goal:  Over the next 30 days, the patient will not experience hospital readmission  Interventions:   COPD Interventions: Advised patient to track and manage COPD triggers Assessed social determinant of health barriers Provided education about and advised patient to utilize infection prevention strategies to reduce risk of respiratory infection Provided instruction about proper use of medications used for management of COPD including inhalers Referral made to community resources care guide team for assistance with Utilities due to the patient does not have air conditioning. She is able to stay with her daughter when there is extreme heat and cold Use of home oxygen  Patient Self Care Activities:  Attend all scheduled provider appointments Call pharmacy for medication refills 3-7 days in advance of running out of medications Call provider office for new concerns or questions  Notify RN Care Manager of TOC call rescheduling needs Participate in Transition of Care Program/Attend TOC scheduled calls Perform all self care activities independently  Take medications as prescribed   do breathing exercises every day develop a rescue plan eliminate symptom triggers at home follow rescue plan if symptoms flare-up  Plan:  Telephone follow up appointment with care management team member scheduled  for:  July 2nd at 10:30am        Patient verbalizes understanding of instructions and care plan provided today and agrees to view in MyChart. Active MyChart status and patient understanding of how to access instructions and care plan via MyChart confirmed with patient.     The patient has been provided with contact information for the care management team and has been advised to call with any health related questions or concerns.   Please call the care guide team at [phone] if you need to cancel or reschedule your appointment.   Please call the Suicide and Crisis Lifeline: 988 call the [place]  National Suicide Prevention Lifeline: (661)599-6832 or TTY: (336) 379-3395 TTY 629-538-1612) to talk to a trained counselor if you are experiencing a Mental Health or Behavioral Health Crisis or need someone to talk to.  Medford Balboa, BSN, RN [place]  VBCI - [employer] [facility] RN Care Manager [phone]

## 2024-05-21 ENCOUNTER — Other Ambulatory Visit: Payer: Self-pay

## 2024-05-21 ENCOUNTER — Telehealth: Payer: Self-pay

## 2024-05-21 NOTE — Patient Instructions (Signed)
 Visit Information  Thank you for taking time to visit with me today. Please don't hesitate to contact me if I can be of assistance to you before our next scheduled telephone appointment.  Our next appointment is by telephone on Wednesday July 9th at 2:00pm  Following is a copy of your care plan:   Goals Addressed             This Visit's Progress    VBCI Transitions of Care (TOC) Care Plan       Problems: (reviewed 05/21/24) Recent Hospitalization for treatment of COPD Medication access barrier Insurance does not pay for Nicotine  patches and SDOH barrier: The patient lives in a trailer with no air conditioning  Goal:  (reviewed 05/21/24) Over the next 30 days, the patient will not experience hospital readmission  Interventions:  (reviewed 05/21/24)  COPD Interventions: Advised patient to track and manage COPD triggers Assessed social determinant of health barriers Provided education about and advised patient to utilize infection prevention strategies to reduce risk of respiratory infection Provided instruction about proper use of medications used for management of COPD including inhalers Referral made to community resources care guide team for assistance with Utilities due to the patient does not have air conditioning. She is able to stay with her daughter when there is extreme heat and cold Use of home oxygen Encourage smoking cessation Discussed coping strategies in patient's personal life that did not involve smoking cigarettes or drinking alcohol   Patient Self Care Activities:  (reviewed 05/21/24) Attend all scheduled provider appointments Call pharmacy for medication refills 3-7 days in advance of running out of medications Call provider office for new concerns or questions  Notify RN Care Manager of TOC call rescheduling needs Participate in Transition of Care Program/Attend TOC scheduled calls Perform all self care activities independently  Take medications as prescribed    do breathing exercises every day develop a rescue plan eliminate symptom triggers at home follow rescue plan if symptoms flare-up  Plan:  Telephone follow up appointment with care management team member scheduled for:  Wednesday July 9th at 2:00pm        Patient verbalizes understanding of instructions and care plan provided today and agrees to view in MyChart. Active MyChart status and patient understanding of how to access instructions and care plan via MyChart confirmed with patient.     The patient has been provided with contact information for the care management team and has been advised to call with any health related questions or concerns.   Please call the care guide team at [phone] if you need to cancel or reschedule your appointment.   Please call the Suicide and Crisis Lifeline: 988 call the [place]  National Suicide Prevention Lifeline: (203)870-4874 or TTY: 301-522-3140 TTY 737-046-9370) to talk to a trained counselor if you are experiencing a Mental Health or Behavioral Health Crisis or need someone to talk to.  Medford Balboa, BSN, RN [place]  VBCI - [employer] [facility] RN Care Manager [phone]

## 2024-05-21 NOTE — Transitions of Care (Post Inpatient/ED Visit) (Signed)
 Transition of Care week 2  Visit Note  05/21/2024  Name: Kelly Dillon MRN: [id number]          DOB: [birth year]  Situation: Patient enrolled in [facility] 30-day program. Visit completed with Raoul Jubilee  by telephone.   Background:     Past Medical History:  Diagnosis Date   Acute on chronic respiratory failure with hypoxia (HCC) 08/27/2021   Chronic pain syndrome    extensive - see problem list   Community acquired pneumonia of right lower lobe of lung 08/27/2021   COPD (chronic obstructive pulmonary disease) (HCC)    Diabetes mellitus without complication (HCC)    Fibromyalgia    went to [facility] for monthly lidocaine  infusions   Hypertension    Sepsis (HCC) 08/26/2021    Assessment: Patient Reported Symptoms: Cognitive Cognitive Status: Alert and oriented to person, place, and time      Neurological Neurological Review of Symptoms: Other: Oher Neurological Symptoms/Conditions [RPT]: Neuropathy Neurological Management Strategies: Medication therapy Neurological Self-Management Outcome: 4 (good)  HEENT HEENT Symptoms Reported: No symptoms reported      Cardiovascular Cardiovascular Symptoms Reported: No symptoms reported Weight: 189 lb (85.7 kg) Cardiovascular Self-Management Outcome: 4 (good)  Respiratory Respiratory Symptoms Reported: No symptoms reported Additional Respiratory Details: Oxygen continuous Respiratory Self-Management Outcome: 4 (good)  Endocrine Endocrine Symptoms Reported: Shakiness Is patient diabetic?: Yes Is patient checking blood sugars at home?: No Endocrine Self-Management Outcome: 3 (uncertain) Endocrine Comment: The patient is scheduling an appointment with her PCP. She does not like to stick her finger and that is why she doesn't take her blood sugar. She is going to ask for a CGM  Gastrointestinal Gastrointestinal Symptoms Reported: No symptoms reported Gastrointestinal Comment: Reviewed opioids and the side effect of  constipation Nutrition Risk Screen (CP): No indicators present  Genitourinary Genitourinary Symptoms Reported: Incontinence Genitourinary Management Strategies: Incontinence garment/pad Genitourinary Comment: The patient has an appointment with a Urologist on 06/18/24  Integumentary Integumentary Symptoms Reported: No symptoms reported    Musculoskeletal Musculoskelatal Symptoms Reviewed: Muscle pain, Back pain, Joint pain Additional Musculoskeletal Details: Chronic OA Musculoskeletal Management Strategies: Medication therapy, Routine screening, Adequate rest Musculoskeletal Comment: The patient states that she can generally do most things she wants      Psychosocial Psychosocial Symptoms Reported: Difficulty concentrating, Other Additional Psychological Details: States her daughters irratic behavior gets her stressed out and having to care for her grandchildren Behavioral Health Comment: The patient has not had thoughts of suicide since and has not been drinking Major Change/Loss/Stressor/Fears (CP): Relationship concerns     There were no vitals filed for this visit.  Medications Reviewed Today     Reviewed by Moises Reusing, RN (Case Manager) on 05/21/24 at 1514  Med List Status: <None>   Medication Order Taking? Sig Documenting Provider Last Dose Status Informant  albuterol  (PROVENTIL ) (2.5 MG/3ML) 0.083% nebulizer solution [id number]  Inhale 3 mLs into the lungs every 6 (six) hours as needed. Scoggins, Hospital doctor, NP  Active Pharmacy Records, Multiple Informants, Other           Med Note GROVER BURNARD RAMAN   Sat May 11, 2024 10:47 AM) PRN  ARIPiprazole  (ABILIFY ) 10 MG tablet [id number]  Take 1 tablet (10 mg total) by mouth 2 (two) times daily. Josette Ade, MD  Active   atorvastatin  (LIPITOR) 40 MG tablet [id number]  Take 1 tablet (40 mg total) by mouth daily. Scoggins, Hospital doctor, NP  Active Pharmacy Records, Multiple Informants, Other  Med Note GROVER BURNARD RAMAN   Sat May 11, 2024 10:47 AM) 02/22/2024 40 MG TABS (disp 90, 90d supply)  Blood Glucose Monitoring Suppl (ACCU-CHEK GUIDE ME) w/Device KIT [id number]  Use to check blood glucose three times daily before meals and at bedtime Orlean Alan HERO, FNP  Active Pharmacy Records, Multiple Informants, Other  COMFORT EZ PEN NEEDLES 32G X 4 MM MISC [id number]  1 Needle in the morning, at noon, in the evening, and at bedtime. [provider]  Active Pharmacy Records, Multiple Informants, Other  Fluticasone -Umeclidin-Vilant (TRELEGY ELLIPTA ) 100-62.5-25 MCG/ACT AEPB [id number]  Inhale 1 Inhalation into the lungs daily. Josette Ade, MD  Active   folic acid  (FOLVITE ) 1 MG tablet [id number]  Take 1 tablet (1 mg total) by mouth daily. Josette Ade, MD  Active   glucose blood (ACCU-CHEK GUIDE) test strip [id number]  Use to check blood glucose before meals and at bedtime Orlean Alan HERO, FNP  Active Pharmacy Records, Multiple Informants, Other  HYDROcodone -acetaminophen  (NORCO/VICODIN) 5-325 MG tablet [id number]  Take 1 tablet by mouth every 6 (six) hours as needed for severe pain (pain score 7-10). Josette Ade, MD  Active   Ipratropium-Albuterol  (COMBIVENT ) 20-100 MCG/ACT AERS respimat [id number]  Inhale 1 puff into the lungs every 6 (six) hours as needed. Carin Gauze, NP  Active Pharmacy Records, Multiple Informants, Other           Med Note GROVER BURNARD RAMAN   Sat May 11, 2024 10:52 AM)   02/22/2024 0.5-2.5 (3) MG/3ML SOLN (disp 270, 15d supply)    Lancets (ONETOUCH ULTRASOFT) lancets [id number]  Use to check blood sugars twice daily. Orlean Alan HERO, FNP  Active Pharmacy Records, Multiple Informants, Other  lithium  carbonate (LITHOBID ) 300 MG ER tablet [id number]  Take 1 tablet (300 mg total) by mouth every 12 (twelve) hours. Josette Ade, MD  Active   losartan  (COZAAR ) 50 MG tablet [id number]  Take 1 tablet (50 mg total) by mouth daily. Orlean Alan HERO, FNP  Active Pharmacy Records, Multiple  Informants, Other           Med Note GROVER BURNARD RAMAN   Sat May 11, 2024 10:53 AM) 04/23/2024 50 MG TABS (disp 90, 90d supply)  methocarbamol  (ROBAXIN ) 500 MG tablet [id number]  Take 1 tablet (500 mg total) by mouth 3 (three) times daily. Josette Ade, MD  Active   montelukast  (SINGULAIR ) 10 MG tablet [id number]  Take 1 tablet by mouth daily. [provider]  Active Pharmacy Records, Multiple Informants, Other           Med Note GROVER BURNARD RAMAN   Sat May 11, 2024 10:54 AM)   05/10/2024 10 MG TABS (disp 90, 90d supply)    Multiple Vitamins-Minerals (CENTRUM SILVER 50+WOMEN) TABS [id number]  Take 1 capsule by mouth See admin instructions. [provider]  Active Pharmacy Records, Multiple Informants, Other  nicotine  (NICODERM CQ  - DOSED IN MG/24 HOURS) 14 mg/24hr patch [id number]  Place 1 patch (14 mg total) onto the skin daily.  Patient not taking: Reported on 05/15/2024   Josette Ade, MD  Active   nitroGLYCERIN  (NITROSTAT ) 0.4 MG SL tablet [id number]  Place 1 tablet (0.4 mg total) under the tongue every 5 (five) minutes as needed for chest pain. Carin Gauze, NP  Active Pharmacy Records, Multiple Informants, Other           Med Note GROVER BURNARD RAMAN   Sat May 11, 2024 10:55 AM) PRN  NOVOLOG  FLEXPEN 100 UNIT/ML FlexPen [id number]  Inject 7 Units into the skin 3 (three) times daily with meals. [provider]  Active Pharmacy Records, Multiple Informants, Other           Med Note GROVER BURNARD RAMAN   Sat May 11, 2024 10:55 AM) 02/26/2024 100 UNIT/ML SOPN (disp 15, 90d supply  OXYGEN [id number]  3LPM [provider]  Active Pharmacy Records, Multiple Informants, Other  pregabalin  (LYRICA ) 200 MG capsule [id number]  Take 1 capsule (200 mg total) by mouth in the morning, at noon, and at bedtime. Josette Ade, MD  Active   thiamine  (VITAMIN B1) 100 MG tablet [id number]  Take 1 tablet (100 mg total) by mouth daily. Josette Ade, MD  Active    TRESIBA  FLEXTOUCH 100 UNIT/ML FlexTouch Pen [id number]  Inject 2 Units into the skin at bedtime. Josette Ade, MD  Active             Recommendation:   Continue Current Plan of Care  Follow Up Plan:   Telephone follow-up in 1 week  Medford Balboa, BSN, RN [place]  VBCI - [facility] RN Care Manager [phone]

## 2024-05-22 ENCOUNTER — Telehealth

## 2024-05-27 ENCOUNTER — Other Ambulatory Visit: Payer: Self-pay

## 2024-05-29 ENCOUNTER — Other Ambulatory Visit: Payer: Self-pay

## 2024-05-29 DIAGNOSIS — J441 Chronic obstructive pulmonary disease with (acute) exacerbation: Secondary | ICD-10-CM

## 2024-05-29 DIAGNOSIS — F1090 Alcohol use, unspecified, uncomplicated: Secondary | ICD-10-CM

## 2024-05-29 NOTE — Transitions of Care (Post Inpatient/ED Visit) (Signed)
 Transition of Care week 3  Visit Note  05/29/2024  Name: Kelly Dillon MRN: [id number]          DOB: 06/02/1961  Situation: Patient enrolled in [facility] 30-day program. Visit completed with Raoul Jubilee by telephone.   Background:     Past Medical History:  Diagnosis Date   Acute on chronic respiratory failure with hypoxia (HCC) 08/27/2021   Chronic pain syndrome    extensive - see problem list   Community acquired pneumonia of right lower lobe of lung 08/27/2021   COPD (chronic obstructive pulmonary disease) (HCC)    Diabetes mellitus without complication (HCC)    Fibromyalgia    went to [facility] for monthly lidocaine  infusions   Hypertension    Sepsis (HCC) 08/26/2021    Assessment: Patient Reported Symptoms: Cognitive Cognitive Status: Alert and oriented to person, place, and time      Neurological Neurological Review of Symptoms: Other: Oher Neurological Symptoms/Conditions [RPT]: Neuropathy Neurological Management Strategies: Medication therapy  HEENT HEENT Symptoms Reported: No symptoms reported      Cardiovascular Cardiovascular Symptoms Reported: No symptoms reported    Respiratory Respiratory Symptoms Reported: No symptoms reported Additional Respiratory Details: The patient is not wearing her oxygen. She is currently in [place]  visiting her son and does not have it with her. She also smokes about five cigarettes a day and states she is still trying to quit Respiratory Management Strategies: Oxygen therapy, Routine screening, Medication therapy, Activity Respiratory Self-Management Outcome: 4 (good)  Endocrine Endocrine Symptoms Reported: No symptoms reported Is patient diabetic?: Yes Is patient checking blood sugars at home?: No Endocrine Self-Management Outcome: 3 (uncertain) Endocrine Comment: The patient has not scheduled an appointment with her PCP. She does not take her blood sugar at home  Gastrointestinal Gastrointestinal Symptoms Reported: No  symptoms reported      Genitourinary Genitourinary Symptoms Reported: Incontinence Genitourinary Management Strategies: Incontinence garment/pad Genitourinary Self-Management Outcome: 3 (uncertain) Genitourinary Comment: The patient states she has an appointment with Urologist 06/18/24  Integumentary Integumentary Symptoms Reported: No symptoms reported    Musculoskeletal Musculoskelatal Symptoms Reviewed: Back pain, Joint pain, Muscle pain Additional Musculoskeletal Details: Chronic OA. She does not have anythng prescribed for pain. She would like to have something. Recommended to the patient to schedule a PCP appointment so that she can have something to help. Message sent to the provider Musculoskeletal Management Strategies: Medication therapy, Routine screening, Activity Musculoskeletal Self-Management Outcome: 4 (good) Musculoskeletal Comment: The patient states her pain level is 100 but she flew to [place]  and is visiting her son without problems      Psychosocial Psychosocial Symptoms Reported: No symptoms reported Additional Psychological Details: The patient is feeling better regarding her mood. Her daughter is out of jail and her son is graduating from a program and she is in [place] Behavioral Management Strategies: Abstinence from substances, Coping strategies, Support system Behavioral Health Self-Management Outcome: 4 (good) Behavioral Health Comment: The patient has not been drinking alcohol and reports no concerns with self harm       There were no vitals filed for this visit.  Medications Reviewed Today     Reviewed by Moises Reusing, RN (Case Manager) on 05/29/24 at 1454  Med List Status: <None>   Medication Order Taking? Sig Documenting Provider Last Dose Status Informant  albuterol  (PROVENTIL ) (2.5 MG/3ML) 0.083% nebulizer solution [id number]  Inhale 3 mLs into the lungs every 6 (six) hours as needed. Scoggins, Hospital doctor, NP  Active Pharmacy Records, Multiple  Informants, Other  Med Note GROVER BURNARD RAMAN   Sat May 11, 2024 10:47 AM) PRN  ARIPiprazole  (ABILIFY ) 10 MG tablet [id number]  Take 1 tablet (10 mg total) by mouth 2 (two) times daily. Josette Ade, MD  Active   atorvastatin  (LIPITOR) 40 MG tablet [id number]  Take 1 tablet (40 mg total) by mouth daily. Carin Gauze, NP  Active Pharmacy Records, Multiple Informants, Other           Med Note GROVER BURNARD RAMAN   Sat May 11, 2024 10:47 AM) 02/22/2024 40 MG TABS (disp 90, 90d supply)  Blood Glucose Monitoring Suppl (ACCU-CHEK GUIDE ME) w/Device KIT [id number]  Use to check blood glucose three times daily before meals and at bedtime Orlean Alan HERO, FNP  Active Pharmacy Records, Multiple Informants, Other  COMFORT EZ PEN NEEDLES 32G X 4 MM MISC [id number]  1 Needle in the morning, at noon, in the evening, and at bedtime. [provider]  Active Pharmacy Records, Multiple Informants, Other  Fluticasone -Umeclidin-Vilant (TRELEGY ELLIPTA ) 100-62.5-25 MCG/ACT AEPB [id number]  Inhale 1 Inhalation into the lungs daily. Josette Ade, MD  Active   folic acid  (FOLVITE ) 1 MG tablet [id number]  Take 1 tablet (1 mg total) by mouth daily. Josette Ade, MD  Active   glucose blood (ACCU-CHEK GUIDE) test strip [id number]  Use to check blood glucose before meals and at bedtime Orlean Alan HERO, FNP  Active Pharmacy Records, Multiple Informants, Other  HYDROcodone -acetaminophen  (NORCO/VICODIN) 5-325 MG tablet [id number]  Take 1 tablet by mouth every 6 (six) hours as needed for severe pain (pain score 7-10). Josette Ade, MD  Active   Ipratropium-Albuterol  (COMBIVENT ) 20-100 MCG/ACT AERS respimat [id number]  Inhale 1 puff into the lungs every 6 (six) hours as needed. Carin Gauze, NP  Active Pharmacy Records, Multiple Informants, Other           Med Note GROVER BURNARD RAMAN   Sat May 11, 2024 10:52 AM)   02/22/2024 0.5-2.5 (3) MG/3ML SOLN (disp 270, 15d supply)    Lancets (ONETOUCH  ULTRASOFT) lancets [id number]  Use to check blood sugars twice daily. Orlean Alan HERO, FNP  Active Pharmacy Records, Multiple Informants, Other  lithium  carbonate (LITHOBID ) 300 MG ER tablet [id number]  Take 1 tablet (300 mg total) by mouth every 12 (twelve) hours. Josette Ade, MD  Active   losartan  (COZAAR ) 50 MG tablet [id number]  Take 1 tablet (50 mg total) by mouth daily. Orlean Alan HERO, FNP  Active Pharmacy Records, Multiple Informants, Other           Med Note GROVER BURNARD RAMAN   Sat May 11, 2024 10:53 AM) 04/23/2024 50 MG TABS (disp 90, 90d supply)  methocarbamol  (ROBAXIN ) 500 MG tablet [id number]  Take 1 tablet (500 mg total) by mouth 3 (three) times daily. Josette Ade, MD  Active   montelukast  (SINGULAIR ) 10 MG tablet [id number]  Take 1 tablet by mouth daily. [provider]  Active Pharmacy Records, Multiple Informants, Other           Med Note GROVER BURNARD RAMAN   Sat May 11, 2024 10:54 AM)   05/10/2024 10 MG TABS (disp 90, 90d supply)    Multiple Vitamins-Minerals (CENTRUM SILVER 50+WOMEN) TABS [id number]  Take 1 capsule by mouth See admin instructions. [provider]  Active Pharmacy Records, Multiple Informants, Other  nicotine  (NICODERM CQ  - DOSED IN MG/24 HOURS) 14 mg/24hr patch [id number]  Place 1 patch (14 mg total) onto the skin daily.  Patient not taking: Reported on 05/15/2024  Josette Ade, MD  Active   nitroGLYCERIN  (NITROSTAT ) 0.4 MG SL tablet [id number]  Place 1 tablet (0.4 mg total) under the tongue every 5 (five) minutes as needed for chest pain. Carin Gauze, NP  Active Pharmacy Records, Multiple Informants, Other           Med Note GROVER BURNARD RAMAN   Sat May 11, 2024 10:55 AM) PRN  NOVOLOG  FLEXPEN 100 UNIT/ML FlexPen [id number]  Inject 7 Units into the skin 3 (three) times daily with meals. [provider]  Active Pharmacy Records, Multiple Informants, Other           Med Note GROVER BURNARD RAMAN   Sat May 11, 2024 10:55  AM) 02/26/2024 100 UNIT/ML SOPN (disp 15, 90d supply  OXYGEN [id number]  3LPM [provider]  Active Pharmacy Records, Multiple Informants, Other  pregabalin  (LYRICA ) 200 MG capsule [id number]  Take 1 capsule (200 mg total) by mouth in the morning, at noon, and at bedtime. Josette Ade, MD  Active   thiamine  (VITAMIN B1) 100 MG tablet [id number]  Take 1 tablet (100 mg total) by mouth daily. Josette Ade, MD  Active   TRESIBA  FLEXTOUCH 100 UNIT/ML FlexTouch Pen [id number]  Inject 2 Units into the skin at bedtime. Josette Ade, MD  Active             Recommendation:   PCP Follow-up Continue Current Plan of Care  Follow Up Plan:   Telephone follow-up in 1 week  Medford Balboa, BSN, RN [place]  VBCI - [facility] RN Care Manager [phone]

## 2024-05-29 NOTE — Patient Instructions (Signed)
 Visit Information  Thank you for taking time to visit with me today. Please don't hesitate to contact me if I can be of assistance to you before our next scheduled telephone appointment.  Our next appointment is by telephone on Wednesday July 16th at 2:00pm  Following is a copy of your care plan:   Goals Addressed             This Visit's Progress    VBCI Transitions of Care (TOC) Care Plan       Problems: (reviewed 05/29/24) Recent Hospitalization for treatment of COPD Medication access barrier Insurance does not pay for Nicotine  patches and SDOH barrier: The patient lives in a trailer with no air conditioning The patient has not scheduled a PCP appointment  Goal:  (reviewed 05/29/24) Over the next 30 days, the patient will not experience hospital readmission  Interventions:  (reviewed 05/29/24)  COPD Interventions: Advised patient to track and manage COPD triggers Assessed social determinant of health barriers Provided education about and advised patient to utilize infection prevention strategies to reduce risk of respiratory infection Provided instruction about proper use of medications used for management of COPD including inhalers Referral made to community resources care guide team for assistance with Utilities due to the patient does not have air conditioning. She is able to stay with her daughter when there is extreme heat and cold Use of home oxygen Encourage smoking cessation - 7/9  the patient states she is smoking 5 cigarettes a day Discussed coping strategies in patient's personal life that did not involve smoking cigarettes or drinking alcohol - 7/9  the patient has not had any alcohol since her last hospitalization  Patient Self Care Activities:  (reviewed 05/29/24) Attend all scheduled provider appointments Call pharmacy for medication refills 3-7 days in advance of running out of medications Call provider office for new concerns or questions  Notify RN Care Manager of  [facility] call rescheduling needs Participate in Transition of Care Program/Attend [facility] scheduled calls Perform all self care activities independently  Take medications as prescribed   do breathing exercises every day develop a rescue plan eliminate symptom triggers at home follow rescue plan if symptoms flare-up  Plan:  Telephone follow up appointment with care management team member scheduled for:  Wednesday July 16th at 2:00pm        Patient verbalizes understanding of instructions and care plan provided today and agrees to view in MyChart. Active MyChart status and patient understanding of how to access instructions and care plan via MyChart confirmed with patient.     The patient has been provided with contact information for the care management team and has been advised to call with any health related questions or concerns.   Please call the care guide team at [phone] if you need to cancel or reschedule your appointment.   Please call the Suicide and Crisis Lifeline: 988 call the [place]  National Suicide Prevention Lifeline: 224-237-3703 or TTY: (901) 773-2142 TTY 930-042-7722) to talk to a trained counselor if you are experiencing a Mental Health or Behavioral Health Crisis or need someone to talk to.  Medford Balboa, BSN, RN [place]  VBCI - [employer] [facility] RN Care Manager [phone]

## 2024-05-30 ENCOUNTER — Telehealth: Payer: Self-pay

## 2024-05-30 NOTE — Progress Notes (Signed)
 Complex Care Management Note Care Guide Note  05/30/2024 Name: Kelly Dillon MRN: [id number] DOB: 1961-06-04  Kelly Dillon is a 63 y.o. year old female who is a primary care patient of Orlean Alan HERO, FNP . The community resource team was consulted for assistance with Food Insecurity and home repair, utilities.  SDOH screenings and interventions completed:  Yes  Social Drivers of Health From This Encounter   Food Insecurity: Food Insecurity Present (05/30/2024)   Hunger Vital Sign    Worried About Running Out of Food in the Last Year: Sometimes true    Ran Out of Food in the Last Year: Never true  Housing: Low Risk  (05/30/2024)   Housing Stability Vital Sign    Unable to Pay for Housing in the Last Year: No    Number of Times Moved in the Last Year: 0    Homeless in the Last Year: No  Financial Resource Strain: Medium Risk (05/30/2024)   Overall Financial Resource Strain (CARDIA)    Difficulty of Paying Living Expenses: Somewhat hard  Utilities: Not At Risk (05/30/2024)   Utilities    Threatened with loss of utilities: No    SDOH Interventions Today    Flowsheet Row Most Recent Value  SDOH Interventions   Food Insecurity Interventions Other (Comment)  [employer] [place] [employer] list.]  Housing Interventions Other (Comment)  [Mailed information for [employer] Included name and contact for Spanish speaking Armed forces operational officer. Patient stated she has someone to assist with application since it could not be converted to Spanish.]  Utilities Interventions Other (Comment)  [Mailed information for utility assistance.]     Care guide performed the following interventions: Patient provided with information about care guide support team and interviewed to confirm resource needs.  Follow Up Plan:  No further follow up planned at this time. The patient has been provided with needed resources.  Encounter Outcome:  Patient Visit  Completed  [facility]  [facility] Guide Direct Dial: [phone]  Fax: [phone] Website: [email or web address]

## 2024-06-05 ENCOUNTER — Other Ambulatory Visit: Payer: Self-pay

## 2024-06-05 NOTE — Transitions of Care (Post Inpatient/ED Visit) (Signed)
 Transition of Care week 4  Visit Note  06/05/2024  Name: Kelly Dillon MRN: [id number]          DOB: 1961/08/23  Situation: Patient enrolled in [facility] 30-day program. Visit completed with Raoul Jubilee by telephone.   Background:   Past Medical History:  Diagnosis Date   Acute on chronic respiratory failure with hypoxia (HCC) 08/27/2021   Chronic pain syndrome    extensive - see problem list   Community acquired pneumonia of right lower lobe of lung 08/27/2021   COPD (chronic obstructive pulmonary disease) (HCC)    Diabetes mellitus without complication (HCC)    Fibromyalgia    went to [facility] for monthly lidocaine  infusions   Hypertension    Sepsis (HCC) 08/26/2021    Assessment: Patient Reported Symptoms: Cognitive Cognitive Status: Alert and oriented to person, place, and time      Neurological Neurological Review of Symptoms: Other: [place] Neurological Symptoms/Conditions [RPT]: Neuropathy Neurological Management Strategies: Medication therapy, Adequate rest  HEENT HEENT Symptoms Reported: No symptoms reported      Cardiovascular Cardiovascular Symptoms Reported: No symptoms reported Does patient have uncontrolled Hypertension?: No    Respiratory Respiratory Symptoms Reported: No symptoms reported Additional Respiratory Details: The patient reports she is wearing her oxygen continuous. She is requesting more portable oxygen takes. She will need to see her PCP in order to get a proper prescription. She continues to smoke a few cigarettes a day. Respiratory Management Strategies: Oxygen therapy, Routine screening, Medication therapy, Activity  Endocrine Endocrine Symptoms Reported: No symptoms reported Is patient diabetic?: Yes Is patient checking blood sugars at home?: No Endocrine Comment: The patient doesn't like to stick her finger. She has not followed up with her PCP  Gastrointestinal Gastrointestinal Symptoms Reported: No symptoms reported   Nutrition  Risk Screen (CP): No indicators present  Genitourinary Genitourinary Symptoms Reported: Incontinence Genitourinary Management Strategies: Incontinence garment/pad  Integumentary Integumentary Symptoms Reported: No symptoms reported    Musculoskeletal Musculoskelatal Symptoms Reviewed: Back pain, Joint pain, Muscle pain Additional Musculoskeletal Details: Chronic OA. She has run out of her pain pills and needs to schedule an appointment with the PCP which she has not done Musculoskeletal Management Strategies: Medication therapy, Routine screening, Activity      Psychosocial Psychosocial Symptoms Reported: No symptoms reported Behavioral Management Strategies: Abstinence from substances, Coping strategies, Support system Behavioral Health Comment: The patient reports abstinence from alcohol       There were no vitals filed for this visit.  Medications Reviewed Today     Reviewed by Moises Reusing, RN (Case Manager) on 06/05/24 at 1420  Med List Status: <None>   Medication Order Taking? Sig Documenting Provider Last Dose Status Informant  albuterol  (PROVENTIL ) (2.5 MG/3ML) 0.083% nebulizer solution [id number]  Inhale 3 mLs into the lungs every 6 (six) hours as needed. [place], Hospital doctor, NP  Active Pharmacy Records, Multiple Informants, Other           Med Note GROVER BURNARD RAMAN   Sat May 11, 2024 10:47 AM) PRN  ARIPiprazole  (ABILIFY ) 10 MG tablet [id number]  Take 1 tablet (10 mg total) by mouth 2 (two) times daily. Josette Ade, MD  Active   atorvastatin  (LIPITOR) 40 MG tablet [id number]  Take 1 tablet (40 mg total) by mouth daily. Carin Gauze, NP  Active Pharmacy Records, Multiple Informants, Other           Med Note GROVER BURNARD RAMAN   Sat May 11, 2024 10:47 AM) 02/22/2024 40 MG TABS (  disp 90, 90d supply)  Blood Glucose Monitoring Suppl (ACCU-CHEK GUIDE ME) w/Device KIT [id number]  Use to check blood glucose three times daily before meals and at bedtime Orlean Alan HERO, FNP   Active Pharmacy Records, Multiple Informants, Other  COMFORT EZ PEN NEEDLES 32G X 4 MM MISC [id number]  1 Needle in the morning, at noon, in the evening, and at bedtime. [provider]  Active Pharmacy Records, Multiple Informants, Other  Fluticasone -Umeclidin-Vilant (TRELEGY ELLIPTA ) 100-62.5-25 MCG/ACT AEPB [id number]  Inhale 1 Inhalation into the lungs daily. Josette Ade, MD  Active   folic acid  (FOLVITE ) 1 MG tablet [id number]  Take 1 tablet (1 mg total) by mouth daily. Josette Ade, MD  Active   glucose blood (ACCU-CHEK GUIDE) test strip [id number]  Use to check blood glucose before meals and at bedtime Orlean Alan HERO, FNP  Active Pharmacy Records, Multiple Informants, Other  HYDROcodone -acetaminophen  (NORCO/VICODIN) 5-325 MG tablet [id number]  Take 1 tablet by mouth every 6 (six) hours as needed for severe pain (pain score 7-10). Josette Ade, MD  Active   Ipratropium-Albuterol  (COMBIVENT ) 20-100 MCG/ACT AERS respimat [id number]  Inhale 1 puff into the lungs every 6 (six) hours as needed. Carin Gauze, NP  Active Pharmacy Records, Multiple Informants, Other           Med Note GROVER BURNARD RAMAN   Sat May 11, 2024 10:52 AM)   02/22/2024 0.5-2.5 (3) MG/3ML SOLN (disp 270, 15d supply)    Lancets (ONETOUCH ULTRASOFT) lancets [id number]  Use to check blood sugars twice daily. Orlean Alan HERO, FNP  Active Pharmacy Records, Multiple Informants, Other  lithium  carbonate (LITHOBID ) 300 MG ER tablet [id number]  Take 1 tablet (300 mg total) by mouth every 12 (twelve) hours. Josette Ade, MD  Active   losartan  (COZAAR ) 50 MG tablet [id number]  Take 1 tablet (50 mg total) by mouth daily. Orlean Alan HERO, FNP  Active Pharmacy Records, Multiple Informants, Other           Med Note GROVER BURNARD RAMAN   Sat May 11, 2024 10:53 AM) 04/23/2024 50 MG TABS (disp 90, 90d supply)  methocarbamol  (ROBAXIN ) 500 MG tablet [id number]  Take 1 tablet (500 mg total) by mouth 3 (three) times  daily. Josette Ade, MD  Active   montelukast  (SINGULAIR ) 10 MG tablet [id number]  Take 1 tablet by mouth daily. [provider]  Active Pharmacy Records, Multiple Informants, Other           Med Note GROVER BURNARD RAMAN   Sat May 11, 2024 10:54 AM)   05/10/2024 10 MG TABS (disp 90, 90d supply)    Multiple Vitamins-Minerals (CENTRUM SILVER 50+WOMEN) TABS [id number]  Take 1 capsule by mouth See admin instructions. [provider]  Active Pharmacy Records, Multiple Informants, Other  nicotine  (NICODERM CQ  - DOSED IN MG/24 HOURS) 14 mg/24hr patch [id number]  Place 1 patch (14 mg total) onto the skin daily.  Patient not taking: Reported on 05/15/2024   Josette Ade, MD  Active   nitroGLYCERIN  (NITROSTAT ) 0.4 MG SL tablet [id number]  Place 1 tablet (0.4 mg total) under the tongue every 5 (five) minutes as needed for chest pain. Carin Gauze, NP  Active Pharmacy Records, Multiple Informants, Other           Med Note GROVER BURNARD RAMAN   Sat May 11, 2024 10:55 AM) PRN  NOVOLOG  FLEXPEN 100 UNIT/ML FlexPen [id number]  Inject 7 Units into the skin 3 (three) times daily with meals. [provider]  Active  Pharmacy Records, Multiple Informants, Other           Med Note GROVER BURNARD RAMAN   Sat May 11, 2024 10:55 AM) 02/26/2024 100 UNIT/ML SOPN (disp 15, 90d supply  OXYGEN [id number]  3LPM  Patient taking differently: Inhale 3 L/min into the lungs daily as needed (SOB). The patient is also smoking 5 cigarettes a day   [provider]  Active Pharmacy Records, Multiple Informants, Other  pregabalin  (LYRICA ) 200 MG capsule [id number]  Take 1 capsule (200 mg total) by mouth in the morning, at noon, and at bedtime. Josette Ade, MD  Active   thiamine  (VITAMIN B1) 100 MG tablet [id number]  Take 1 tablet (100 mg total) by mouth daily. Josette Ade, MD  Active   TRESIBA  FLEXTOUCH 100 UNIT/ML FlexTouch Pen [id number]  Inject 2 Units into the skin at bedtime. Josette Ade, MD  Active             Recommendation:   PCP Follow-up Continue Current Plan of Care  Follow Up Plan:   Telephone follow-up in 1 week  Medford Balboa, BSN, RN [place]  VBCI - [facility] RN Care Manager [phone]

## 2024-06-05 NOTE — Patient Instructions (Signed)
 Visit Information  Thank you for taking time to visit with me today. Please don't hesitate to contact me if I can be of assistance to you before our next scheduled telephone appointment.  Our next appointment is by telephone on Wednesday July 23rd at 1:00pm  Following is a copy of your care plan:   Goals Addressed             This Visit's Progress    VBCI Transitions of Care (TOC) Care Plan       Problems: (reviewed 06/05/24) Recent Hospitalization for treatment of COPD Medication access barrier Insurance does not pay for Nicotine  patches and SDOH barrier: The patient lives in a trailer with no air conditioning - referral completed to SW for SDOH needs. Contact was confirmed 05/30/24 The patient has not scheduled a PCP appointment   Goal:  (reviewed 06/05/24) Over the next 30 days, the patient will not experience hospital readmission  Interventions:  (reviewed 06/05/24)  COPD Interventions: Advised patient to track and manage COPD triggers Assessed social determinant of health barriers Provided education about and advised patient to utilize infection prevention strategies to reduce risk of respiratory infection Provided instruction about proper use of medications used for management of COPD including inhalers Referral made to community resources care guide team for assistance with Utilities due to the patient does not have air conditioning. She is able to stay with her daughter when there is extreme heat and cold Use of home oxygen - Continuous at 3L/min Encourage smoking cessation - 7/9  the patient states she is smoking 5 cigarettes a day Discussed coping strategies in patient's personal life that did not involve smoking cigarettes or drinking alcohol - 7/9  the patient has not had any alcohol since her last hospitalization  Patient Self Care Activities:  (reviewed 06/06/24) Attend all scheduled provider appointments Call pharmacy for medication refills 3-7 days in advance of  running out of medications Call provider office for new concerns or questions  Notify RN Care Manager of TOC call rescheduling needs Participate in Transition of Care Program/Attend TOC scheduled calls Perform all self care activities independently  Take medications as prescribed   do breathing exercises every day develop a rescue plan eliminate symptom triggers at home follow rescue plan if symptoms flare-up  Plan:  Telephone follow up appointment with care management team member scheduled for:  Wednesday July 23rd at 1:00pm        Patient verbalizes understanding of instructions and care plan provided today and agrees to view in MyChart. Active MyChart status and patient understanding of how to access instructions and care plan via MyChart confirmed with patient.     The patient has been provided with contact information for the care management team and has been advised to call with any health related questions or concerns.   Please call the care guide team at [phone] if you need to cancel or reschedule your appointment.   Please call the Suicide and Crisis Lifeline: 988 call the [place]  National Suicide Prevention Lifeline: 607-231-5342 or TTY: 567-316-5948 TTY (503) 142-3782) to talk to a trained counselor if you are experiencing a Mental Health or Behavioral Health Crisis or need someone to talk to. Medford Balboa, BSN, RN [place]  VBCI - [employer] [facility] RN Care Manager [phone]

## 2024-06-11 ENCOUNTER — Ambulatory Visit (INDEPENDENT_AMBULATORY_CARE_PROVIDER_SITE_OTHER): Admitting: Pulmonary Disease

## 2024-06-11 ENCOUNTER — Encounter: Payer: Self-pay | Admitting: Pulmonary Disease

## 2024-06-11 VITALS — BP 112/62 | HR 86 | Ht <= 58 in | Wt 189.0 lb

## 2024-06-11 DIAGNOSIS — R0609 Other forms of dyspnea: Secondary | ICD-10-CM | POA: Diagnosis not present

## 2024-06-11 DIAGNOSIS — R062 Wheezing: Secondary | ICD-10-CM

## 2024-06-11 DIAGNOSIS — J449 Chronic obstructive pulmonary disease, unspecified: Secondary | ICD-10-CM | POA: Diagnosis not present

## 2024-06-11 DIAGNOSIS — Z9189 Other specified personal risk factors, not elsewhere classified: Secondary | ICD-10-CM | POA: Diagnosis not present

## 2024-06-11 MED ORDER — IPRATROPIUM-ALBUTEROL 0.5-2.5 (3) MG/3ML IN SOLN
3.0000 mL | Freq: Once | RESPIRATORY_TRACT | Status: AC
  Administered 2024-06-11: 3 mL via RESPIRATORY_TRACT

## 2024-06-11 MED ORDER — BREZTRI AEROSPHERE 160-9-4.8 MCG/ACT IN AERO: 2.0000 | INHALATION_SPRAY | Freq: Two times a day (BID) | RESPIRATORY_TRACT | 0 refills | Status: DC

## 2024-06-11 NOTE — Progress Notes (Unsigned)
 Subjective:    Patient ID: Kelly Dillon, female    DOB: [birth year], 63 y.o.   MRN: [id number]  Patient Care Team: Orlean Alan HERO, FNP as PCP - General (Family Medicine) Moises Reusing, RN as [facility] Care Management  Chief Complaint  Patient presents with   Establish Care    BACKGROUND: Kelly Dillon is a 63 year old current smoker who presents for evaluation of COPD   HPI  DATA 10/26/2018 PFTs: FEV1 1.24 L, FVC 1.56 L, FEV1/FVC 79%, no significant bronchodilator response  Review of Systems A 10 point review of systems was performed and it is as noted above otherwise negative.   Past Medical History:  Diagnosis Date   Acute on chronic respiratory failure with hypoxia (HCC) 08/27/2021   Chronic pain syndrome    extensive - see problem list   Community acquired pneumonia of right lower lobe of lung 08/27/2021   COPD (chronic obstructive pulmonary disease) (HCC)    Diabetes mellitus without complication (HCC)    Fibromyalgia    went to [facility] for monthly lidocaine  infusions   Hypertension    Sepsis (HCC) 08/26/2021    Past Surgical History:  Procedure Laterality Date   APPENDECTOMY     COLONOSCOPY     COLONOSCOPY WITH PROPOFOL  N/A 03/01/2022   Procedure: COLONOSCOPY WITH PROPOFOL ;  Surgeon: Therisa Bi, MD;  Location: [facility] ENDOSCOPY;  Service: Gastroenterology;  Laterality: N/A;   ESOPHAGOGASTRODUODENOSCOPY     ESOPHAGOGASTRODUODENOSCOPY N/A 03/01/2022   Procedure: ESOPHAGOGASTRODUODENOSCOPY (EGD);  Surgeon: Therisa Bi, MD;  Location: [facility] ENDOSCOPY;  Service: Gastroenterology;  Laterality: N/A;   HERNIA REPAIR     TUBAL LIGATION     VENTRAL HERNIA REPAIR N/A 08/15/2018   Procedure: HERNIA REPAIR VENTRAL ADULT;  Surgeon: Tye Millet, DO;  Location: [facility] ORS;  Service: General;  Laterality: N/A;   VENTRAL HERNIA REPAIR N/A 08/18/2018   Procedure: LAPAROSCOPIC VENTRAL HERNIA;  Surgeon: Tye Millet, DO;  Location: [facility] ORS;  Service: General;   Laterality: N/A;    Patient Active Problem List   Diagnosis Date Noted   Bipolar 1 disorder (HCC) 05/12/2024   Obesity, Class III, BMI 40-49.9 (morbid obesity) 05/12/2024   Neuropathy 05/12/2024   COPD exacerbation (HCC) 05/11/2024   Severe episode of recurrent major depressive disorder, without psychotic features (HCC) 05/10/2024   Suicidal ideation 05/10/2024   Homicidal thoughts 05/10/2024   COPD with acute exacerbation (HCC) 11/29/2023   Chronic respiratory failure with hypoxia (HCC) 11/28/2023   Essential hypertension 05/26/2022   Hardening of the aorta (main artery of the heart) (HCC) 05/26/2022   Major depressive disorder, single episode, severe with psychotic features (HCC) 05/26/2022   Hyperlipidemia 05/26/2022   Osteoarthritis of knee 05/26/2022   Overactive bladder 05/26/2022   Sciatica 05/26/2022   Alcohol use disorder 02/08/2022   Diverticulitis 02/08/2022   Frailty 02/08/2022   Incisional hernia without obstruction or gangrene 02/08/2022   Long term (current) use of insulin  (HCC) 02/08/2022   Major depressive disorder, recurrent, moderate (HCC) 02/08/2022   Oxygen dependent 02/08/2022   Smoking trying to quit 02/08/2022   Dysphagia 02/08/2022   Immunodeficiency due to conditions classified elsewhere (HCC) 02/08/2022   Hypokalemia 08/27/2021   Moderate persistent asthma, uncomplicated 08/27/2021   S/P repair of paraesophageal hernia 01/16/2020   Dysfunctional voiding of urine 08/21/2019   Scaly patch rash 08/21/2019   Restrictive lung disease 11/03/2018   Insomnia 10/13/2018   Dyslipidemia 10/13/2018   Other specified anxiety disorders 09/22/2018   Abdominal pain 08/17/2018  Ventral hernia, recurrent 08/15/2018   Chronic generalized abdominal pain (Primary Area of Pain) 02/21/2018   Chronic ankle pain, bilateral (Secondary Area of Pain) (L>R) 02/21/2018   Chronic pain of both knees [facility] Area of Pain) (L>R) 02/21/2018   Chronic bilateral low back pain  with bilateral sciatica (Fourth Area of Pain) (L>R) 02/21/2018   Chronic neck pain(midline) 02/21/2018   Chronic left shoulder pain 02/21/2018   Chronic upper extremity pain (L>R) 02/21/2018   Chronic pain of both lower extremities (L>R) 02/21/2018   Chronic pain syndrome 02/21/2018   Long term current use of opiate analgesic 02/21/2018   Pharmacologic therapy 02/21/2018   Disorder of skeletal system 02/21/2018   Morbid obesity (HCC) 09/28/2017   Shortness of breath 08/28/2017   High risk medication use 06/15/2017   Multiple joint pain 01/24/2017   Hernia of abdominal wall 01/12/2017   Sacroiliitis (HCC) 10/06/2016   Colon polyp 06/11/2016   BMI 39.0-39.9,adult 02/18/2016   Excessive daytime sleepiness 11/11/2015   Avascular necrosis of bones of both hips (HCC) 04/20/2015   Mixed stress and urge urinary incontinence 12/09/2014   Uncontrolled type 2 diabetes mellitus with hyperglycemia, with long-term current use of insulin  (HCC) 05/01/2013   Chronic, continuous use of opioids 03/13/2013   DDD (degenerative disc disease), lumbar 03/13/2013   Suicide attempt (HCC) 03/13/2013   Recurrent ventral hernia 08/28/2012   Moderate COPD (chronic obstructive pulmonary disease) (HCC) 07/11/2012   Tobacco abuse 03/01/2012   Depression, major, recurrent, moderate (HCC) 07/21/2011   Diverticulosis 07/21/2011   Fibromyalgia 07/21/2011   GERD (gastroesophageal reflux disease) 07/21/2011    History reviewed. No pertinent family history.  Social History   Tobacco Use   Smoking status: Every Day    Current packs/day: 1.00    Types: Cigarettes   Smokeless tobacco: Never   Tobacco comments:    SMOKES 2-3 CIGARETTES QD  Substance Use Topics   Alcohol use: Yes    Comment: gallon of liqour per day x 2 weeks    No Known Allergies  Current Meds  Medication Sig   albuterol  (PROVENTIL ) (2.5 MG/3ML) 0.083% nebulizer solution Inhale 3 mLs into the lungs every 6 (six) hours as needed.    amoxicillin-clavulanate (AUGMENTIN) 875-125 MG tablet Take 1 tablet by mouth 2 (two) times daily.   ARIPiprazole  (ABILIFY ) 10 MG tablet Take 1 tablet (10 mg total) by mouth 2 (two) times daily.   atorvastatin  (LIPITOR) 40 MG tablet Take 1 tablet (40 mg total) by mouth daily.   Blood Glucose Monitoring Suppl (ACCU-CHEK GUIDE ME) w/Device KIT Use to check blood glucose three times daily before meals and at bedtime   COMFORT EZ PEN NEEDLES 32G X 4 MM MISC 1 Needle in the morning, at noon, in the evening, and at bedtime.   Fluticasone -Umeclidin-Vilant (TRELEGY ELLIPTA ) 100-62.5-25 MCG/ACT AEPB Inhale 1 Inhalation into the lungs daily.   folic acid  (FOLVITE ) 1 MG tablet Take 1 tablet (1 mg total) by mouth daily.   glucose blood (ACCU-CHEK GUIDE) test strip Use to check blood glucose before meals and at bedtime   HYDROcodone -acetaminophen  (NORCO/VICODIN) 5-325 MG tablet Take 1 tablet by mouth every 6 (six) hours as needed for severe pain (pain score 7-10).   Ipratropium-Albuterol  (COMBIVENT ) 20-100 MCG/ACT AERS respimat Inhale 1 puff into the lungs every 6 (six) hours as needed.   Lancets (ONETOUCH ULTRASOFT) lancets Use to check blood sugars twice daily.   lithium  carbonate (LITHOBID ) 300 MG ER tablet Take 1 tablet (300 mg total) by mouth every  12 (twelve) hours.   losartan  (COZAAR ) 50 MG tablet Take 1 tablet (50 mg total) by mouth daily.   methocarbamol  (ROBAXIN ) 500 MG tablet Take 1 tablet (500 mg total) by mouth 3 (three) times daily.   montelukast  (SINGULAIR ) 10 MG tablet Take 1 tablet by mouth daily.   Multiple Vitamins-Minerals (CENTRUM SILVER 50+WOMEN) TABS Take 1 capsule by mouth See admin instructions.   nicotine  (NICODERM CQ  - DOSED IN MG/24 HOURS) 14 mg/24hr patch Place 1 patch (14 mg total) onto the skin daily.   nitroGLYCERIN  (NITROSTAT ) 0.4 MG SL tablet Place 1 tablet (0.4 mg total) under the tongue every 5 (five) minutes as needed for chest pain.   NOVOLOG  FLEXPEN 100 UNIT/ML FlexPen  Inject 7 Units into the skin 3 (three) times daily with meals.   oxybutynin  (DITROPAN ) 5 MG tablet Take 5 mg by mouth 3 (three) times daily.   OXYGEN 3LPM (Patient taking differently: Inhale 3 L/min into the lungs continuous. The patient is also smoking 5 cigarettes a day)   pregabalin  (LYRICA ) 200 MG capsule Take 1 capsule (200 mg total) by mouth in the morning, at noon, and at bedtime.   thiamine  (VITAMIN B1) 100 MG tablet Take 1 tablet (100 mg total) by mouth daily.   TRESIBA  FLEXTOUCH 100 UNIT/ML FlexTouch Pen Inject 2 Units into the skin at bedtime.   venlafaxine  XR (EFFEXOR -XR) 150 MG 24 hr capsule Take 300 mg by mouth daily.    Immunization History  Administered Date(s) Administered   Influenza Split 09/01/2008, 08/19/2011, 08/15/2014, 09/12/2014   Influenza, Mdck, Trivalent,PF 6+ MOS(egg free) 09/25/2023   Influenza, Seasonal, Injecte, Preservative Fre 11/30/2011, 09/06/2012, 09/12/2014   Influenza,inj,Quad PF,6+ Mos 09/14/2016, 06/26/2017, 08/30/2017, 09/14/2018, 08/02/2019, 08/28/2021   Influenza-Unspecified 08/19/2011, 11/30/2011, 09/06/2012, 08/26/2015, 10/08/2021   PFIZER(Purple Top)SARS-COV-2 Vaccination 10/20/2020   PNEUMOCOCCAL CONJUGATE-20 12/01/2023   Pneumococcal Polysaccharide-23 06/24/2015, 03/31/2017   Tdap 03/10/2010        Objective:     BP 112/62   Pulse 86   Ht 4' 9 (1.448 m)   Wt 189 lb (85.7 kg)   SpO2 92%   BMI 40.90 kg/m   SpO2: 92 %  GENERAL: HEAD: Normocephalic, atraumatic.  EYES: Pupils equal, round, reactive to light.  No scleral icterus.  MOUTH: Edentulous NECK: Supple. No thyromegaly. Trachea midline. No JVD.  No adenopathy. PULMONARY: Good air entry bilaterally.  No adventitious sounds. CARDIOVASCULAR: S1 and S2. Regular rate and rhythm.  ABDOMEN: MUSCULOSKELETAL: No joint deformity, no clubbing, no edema.  NEUROLOGIC:  SKIN: Intact,warm,dry. PSYCH:        Assessment & Plan:     ICD-10-CM   1. Moderate COPD (chronic  obstructive pulmonary disease) (HCC)  J44.9 Pulmonary function test    2. Wheezing  R06.2 ipratropium-albuterol  (DUONEB) 0.5-2.5 (3) MG/3ML nebulizer solution 3 mL    3. At risk for sleep apnea  Z91.89 Split night study    4. Dyspnea on exertion  R06.09 Pulmonary function test    ECHOCARDIOGRAM COMPLETE      Orders Placed This Encounter  Procedures   ECHOCARDIOGRAM COMPLETE    Standing Status:   Future    Expected Date:   07/12/2024    Expiration Date:   06/11/2025    Where should this test be performed:   [facility]    Perflutren DEFINITY (image enhancing agent) should be administered unless hypersensitivity or allergy exist:   Administer Perflutren    Reason for exam-Echo:   Dyspnea  R06.00   Pulmonary function test  Standing Status:   Future    Expiration Date:   06/11/2025    Where should this test be performed?:   [facility]    What type of PFT is being ordered?:   Full PFT   Split night study    Standing Status:   Future    Expected Date:   06/25/2024    Expiration Date:   06/11/2025    Where should this test be performed::   [place]    Meds ordered this encounter  Medications   ipratropium-albuterol  (DUONEB) 0.5-2.5 (3) MG/3ML nebulizer solution 3 mL     Advised if symptoms do not improve or worsen, to please contact office for sooner follow up or seek emergency care.    I spent xxx minutes of dedicated to the care of this patient on the date of this encounter to include pre-visit review of records, face-to-face time with the patient discussing conditions above, post visit ordering of testing, clinical documentation with the electronic health record, making appropriate referrals as documented, and communicating necessary findings to members of the patients care team.   C. Leita Sanders, MD Advanced Bronchoscopy PCCM [facility]    *This note was dictated using voice recognition software/Dragon.  Despite best efforts to proofread,  errors can occur which can change the meaning. Any transcriptional errors that result from this process are unintentional and may not be fully corrected at the time of dictation.

## 2024-06-11 NOTE — Patient Instructions (Addendum)
 VISIT SUMMARY:  Today, you were seen for an evaluation of your COPD and other related health concerns. We discussed your worsening symptoms, including shortness of breath, snoring, and swelling in your feet. We also talked about your smoking habits and the need for a more comprehensive sleep study.  YOUR PLAN:  -CHRONIC OBSTRUCTIVE PULMONARY DISEASE (COPD): COPD is a chronic lung condition that makes it hard to breathe. We are changing your inhaler to a new one that you will use two times a day, with two inhalations each time. You will also use a spacer to help the medication reach your lungs better and rinse your mouth after using the inhaler. We will conduct a pulmonary function test and a walking test with oxygen to better understand your condition.  -NICOTINE  DEPENDENCE: Nicotine  dependence means you are addicted to smoking. We discussed the importance of quitting smoking, especially because it affects your lung health.  -SLEEP APNEA: Sleep apnea is a condition where you stop breathing for short periods while you sleep. We will order a new sleep study without sleep medication to get a better understanding of your sleep apnea.  -CARDIAC EVALUATION: We need to check your heart because of the swelling in your feet and the episode of speech difficulty you experienced. We will order a cardiac evaluation to assess your heart function.  INSTRUCTIONS:  Please follow up with the pulmonary function test, walking test with oxygen, and the cardiac evaluation as soon as possible. Also, make sure to schedule the repeat sleep study without sleep medication.    RESUMEN DE LA VISITA:  Hoy le realizaron una evaluacin de su EPOC y otros problemas de salud relacionados. Hablamos sobre el empeoramiento de sus sntomas, como dificultad para respirar, ronquidos e hinchazn en los pies. Tambin hablamos sobre su hbito de fumar y la necesidad de un estudio del sueo ms completo.  SU PLAN:  - ENFERMEDAD  PULMONAR OBSTRUCTIVA CRNICA (EPOC): La EPOC es una enfermedad pulmonar crnica que dificulta la respiracin. Le cambiaremos el inhalador por uno nuevo que usar dos veces al da, con dos inhalaciones cada vez. Tambin usar una cmara de inhalacin para facilitar la llegada del medicamento a los pulmones y se enjuagar la boca despus de usarlo. Le realizaremos una prueba de funcin pulmonar y ignacia prueba de marcha con oxgeno para comprender mejor su afeccin.  - DEPENDENCIA A LA NICOTINA: La dependencia a la nicotina significa que es adicto al tabaco. Hablamos sobre la importancia de dejar de fumar, especialmente porque afecta la salud pulmonar.  APNEA DEL SUEO: La apnea del sueo es burkina faso afeccin en la que la respiracin se detiene durante breves periodos mientras duerme. Solicitaremos un nuevo estudio del sueo sin medicacin para comprender mejor su apnea del sueo.  EVALUACIN CARDACA: Necesitamos revisarle el corazn debido a la hinchazn en los pies y la dificultad para hablar que experiment. Solicitaremos una evaluacin cardaca para evaluar su funcin cardaca.  INSTRUCCIONES:  Por favor, realice una prueba de funcin pulmonar, una prueba de marcha con oxgeno y ignacia evaluacin cardaca lo antes posible. Adems, asegrese de programar la repeticin del estudio del sueo sin medicacin.

## 2024-06-12 ENCOUNTER — Other Ambulatory Visit: Payer: Self-pay

## 2024-06-12 NOTE — Patient Instructions (Signed)
 Visit Information  Thank you for taking time to visit with me today. Please don't hesitate to contact me if I can be of assistance to you before our next scheduled telephone appointment.  Our next appointment is by telephone on Wednesday July 30th at 2:00pm  Following is a copy of your care plan:   Goals Addressed             This Visit's Progress    [facility]I Transitions of Care (TOC) Care Plan       Problems: (reviewed 06/12/24) Recent Hospitalization for treatment of COPD Medication access barrier Insurance does not pay for Nicotine  patches and SDOH barrier: The patient lives in a trailer with no air conditioning - referral completed to SW for SDOH needs. Contact was confirmed 05/30/24 The patient has not scheduled a PCP appointment - appointment scheduled for 06/17/24  Goal:  (reviewed 06/12/24) Over the next 30 days, the patient will not experience hospital readmission  Interventions:  (reviewed 06/12/24)  COPD Interventions: Advised patient to track and manage COPD triggers Assessed social determinant of health barriers Provided education about and advised patient to utilize infection prevention strategies to reduce risk of respiratory infection Provided instruction about proper use of medications used for management of COPD including inhalers Referral made to community resources care guide team for assistance with Utilities due to the patient does not have air conditioning. She is able to stay with her daughter when there is extreme heat and cold Use of home oxygen - Continuous at 3L/min Encourage smoking cessation - 7/9  the patient states she is smoking 5 cigarettes a day Discussed coping strategies in patient's personal life that did not involve smoking cigarettes or drinking alcohol - 7/9  the patient has not had any alcohol since her last hospitalization Sleep study ordered 06/11/24 by Pulmonology Nebulizer machine ordered 06/11/24 by Pulmonology because the patient indicated hers  is broken.   Patient Self Care Activities:  (reviewed 06/12/24) Attend all scheduled provider appointments Call pharmacy for medication refills 3-7 days in advance of running out of medications Call provider office for new concerns or questions  Notify RN Care Manager of [facility] call rescheduling needs Participate in Transition of Care Program/Attend [facility] scheduled calls Perform all self care activities independently  Take medications as prescribed   do breathing exercises every day develop a rescue plan eliminate symptom triggers at home follow rescue plan if symptoms flare-up  Plan:  Telephone follow up appointment with care management team member scheduled for:  Wednesday July 30th at 2:00pm        Patient verbalizes understanding of instructions and care plan provided today and agrees to view in MyChart. Active MyChart status and patient understanding of how to access instructions and care plan via MyChart confirmed with patient.     The patient has been provided with contact information for the care management team and has been advised to call with any health related questions or concerns.   Please call the care guide team at [phone] if you need to cancel or reschedule your appointment.   Please call the Suicide and Crisis Lifeline: 988 call the [place]  National Suicide Prevention Lifeline: 8040110291 or TTY: 660-742-8859 TTY [phone]) to talk to a trained counselor if you are experiencing a Mental Health or Behavioral Health Crisis or need someone to talk to.  Medford Balboa, BSN, RN [place]  [facility]BCI - [employer] [facility] RN Care Manager [phone]

## 2024-06-12 NOTE — Transitions of Care (Post Inpatient/ED Visit) (Signed)
 Transition of Care Week 5  Visit Note  06/12/2024  Name: Kelly Dillon MRN: [id number]          DOB: 1961/05/18  Situation: Patient enrolled in [facility] 30-day program. Visit completed with Raoul Jubilee by telephone.   Background:   Past Medical History:  Diagnosis Date   Acute on chronic respiratory failure with hypoxia (HCC) 08/27/2021   Chronic pain syndrome    extensive - see problem list   Community acquired pneumonia of right lower lobe of lung 08/27/2021   COPD (chronic obstructive pulmonary disease) (HCC)    Diabetes mellitus without complication (HCC)    Fibromyalgia    went to [facility] for monthly lidocaine  infusions   Hypertension    Sepsis (HCC) 08/26/2021    Assessment: Patient Reported Symptoms: Cognitive Cognitive Status: Alert and oriented to person, place, and time      Neurological Neurological Review of Symptoms: Other: Oher Neurological Symptoms/Conditions [RPT]: Neuropathy Neurological Management Strategies: Medication therapy, Adequate rest  HEENT HEENT Symptoms Reported: No symptoms reported      Cardiovascular Cardiovascular Symptoms Reported: No symptoms reported    Respiratory Respiratory Symptoms Reported: Dry cough, Shortness of breath Additional Respiratory Details: The patient states she does not feel good today and she is coughing while on the phone. The patient does have nebulizer medication but states her nebulizer machine is broken. The patient reports she told her Pulmonolgist yesterday and that she is to get a new one. RNCM to contact the pulmonolgist ofice to confirm and to inform of cough and SOB. The patient has a rescue inhaler on her med list. She states she has to call the pharmacy for a refill Respiratory Management Strategies: Oxygen therapy, Routine screening, Medication therapy, Activity Respiratory Self-Management Outcome: 3 (uncertain)  Endocrine Endocrine Symptoms Reported: No symptoms reported Is patient diabetic?:  Yes Is patient checking blood sugars at home?: No Endocrine Comment: The patient does not test her blood sugar at home. She states she drinks Coke. Encourage the patient to drink only diet sodas or other non sugary drinks. she states she also drinks Gatorade  Gastrointestinal Gastrointestinal Symptoms Reported: No symptoms reported      Genitourinary Genitourinary Symptoms Reported: Incontinence Genitourinary Management Strategies: Incontinence garment/pad Genitourinary Comment: Appointment with Urology 06/18/24  Integumentary Integumentary Symptoms Reported: No symptoms reported    Musculoskeletal Musculoskelatal Symptoms Reviewed: Back pain, Joint pain, Muscle pain Additional Musculoskeletal Details: Chronic OA. She does not have refills on narcotic pain medication Musculoskeletal Management Strategies: Medication therapy, Routine screening, Activity      Psychosocial Psychosocial Symptoms Reported: Depression - if selected complete PHQ 2-9         There were no vitals filed for this visit.  Medications Reviewed Today     Reviewed by Moises Reusing, RN (Case Manager) on 06/12/24 at 1357  Med List Status: <None>   Medication Order Taking? Sig Documenting Provider Last Dose Status Informant  albuterol  (PROVENTIL ) (2.5 MG/3ML) 0.083% nebulizer solution [id number]  Inhale 3 mLs into the lungs every 6 (six) hours as needed. Carin Gauze, NP  Active Pharmacy Records, Multiple Informants, Other           Med Note GROVER BURNARD RAMAN   Sat May 11, 2024 10:47 AM) PRN  amoxicillin-clavulanate (AUGMENTIN) 875-125 MG tablet [id number]  Take 1 tablet by mouth 2 (two) times daily. [provider]  Active   ARIPiprazole  (ABILIFY ) 10 MG tablet [id number]  Take 1 tablet (10 mg total) by mouth 2 (two) times  daily. Josette Ade, MD  Active   atorvastatin  (LIPITOR) 40 MG tablet [id number]  Take 1 tablet (40 mg total) by mouth daily. Carin Gauze, NP  Active Pharmacy Records, Multiple  Informants, Other           Med Note GROVER BURNARD RAMAN   Sat May 11, 2024 10:47 AM) 02/22/2024 40 MG TABS (disp 90, 90d supply)  Blood Glucose Monitoring Suppl (ACCU-CHEK GUIDE ME) w/Device KIT [id number]  Use to check blood glucose three times daily before meals and at bedtime Orlean Alan HERO, FNP  Active Pharmacy Records, Multiple Informants, Other  budesonide -glycopyrrolate-formoterol  (BREZTRI  AEROSPHERE) 160-9-4.8 MCG/ACT AERO inhaler [id number]  Inhale 2 puffs into the lungs in the morning and at bedtime. Tamea Dedra CROME, MD  Active   COMFORT EZ PEN NEEDLES 32G X 4 MM MISC [id number]  1 Needle in the morning, at noon, in the evening, and at bedtime. [provider]  Active Pharmacy Records, Multiple Informants, Other  folic acid  (FOLVITE ) 1 MG tablet [id number]  Take 1 tablet (1 mg total) by mouth daily. Josette Ade, MD  Active   glucose blood (ACCU-CHEK GUIDE) test strip [id number]  Use to check blood glucose before meals and at bedtime Orlean Alan HERO, FNP  Active Pharmacy Records, Multiple Informants, Other  HYDROcodone -acetaminophen  (NORCO/VICODIN) 5-325 MG tablet [id number]  Take 1 tablet by mouth every 6 (six) hours as needed for severe pain (pain score 7-10). Josette Ade, MD  Active   Ipratropium-Albuterol  (COMBIVENT ) 20-100 MCG/ACT AERS respimat [id number]  Inhale 1 puff into the lungs every 6 (six) hours as needed. Carin Gauze, NP  Active Pharmacy Records, Multiple Informants, Other           Med Note GROVER BURNARD RAMAN   Sat May 11, 2024 10:52 AM)   02/22/2024 0.5-2.5 (3) MG/3ML SOLN (disp 270, 15d supply)    Lancets (ONETOUCH ULTRASOFT) lancets [id number]  Use to check blood sugars twice daily. Orlean Alan HERO, FNP  Active Pharmacy Records, Multiple Informants, Other  lithium  carbonate (LITHOBID ) 300 MG ER tablet [id number]  Take 1 tablet (300 mg total) by mouth every 12 (twelve) hours. Josette Ade, MD  Active   losartan  (COZAAR ) 50 MG tablet [id number]   Take 1 tablet (50 mg total) by mouth daily. Orlean Alan HERO, FNP  Active Pharmacy Records, Multiple Informants, Other           Med Note GROVER BURNARD RAMAN   Sat May 11, 2024 10:53 AM) 04/23/2024 50 MG TABS (disp 90, 90d supply)  methocarbamol  (ROBAXIN ) 500 MG tablet [id number]  Take 1 tablet (500 mg total) by mouth 3 (three) times daily. Josette Ade, MD  Active   montelukast  (SINGULAIR ) 10 MG tablet [id number]  Take 1 tablet by mouth daily. [provider]  Active Pharmacy Records, Multiple Informants, Other           Med Note GROVER BURNARD RAMAN   Sat May 11, 2024 10:54 AM)   05/10/2024 10 MG TABS (disp 90, 90d supply)    Multiple Vitamins-Minerals (CENTRUM SILVER 50+WOMEN) TABS [id number]  Take 1 capsule by mouth See admin instructions. [provider]  Active Pharmacy Records, Multiple Informants, Other  nicotine  (NICODERM CQ  - DOSED IN MG/24 HOURS) 14 mg/24hr patch [id number]  Place 1 patch (14 mg total) onto the skin daily.  Patient not taking: Reported on 06/12/2024   Josette Ade, MD  Active   nitroGLYCERIN  (NITROSTAT ) 0.4 MG SL tablet [id number]  Place 1 tablet (0.4 mg total) under the tongue  every 5 (five) minutes as needed for chest pain. Carin Gauze, NP  Active Pharmacy Records, Multiple Informants, Other           Med Note GROVER BURNARD RAMAN   Sat May 11, 2024 10:55 AM) PRN  NOVOLOG  FLEXPEN 100 UNIT/ML FlexPen [id number]  Inject 7 Units into the skin 3 (three) times daily with meals. [provider]  Active Pharmacy Records, Multiple Informants, Other           Med Note GROVER BURNARD RAMAN   Sat May 11, 2024 10:55 AM) 02/26/2024 100 UNIT/ML SOPN (disp 15, 90d supply  oxybutynin  (DITROPAN ) 5 MG tablet [id number]  Take 5 mg by mouth 3 (three) times daily. [provider]  Active   OXYGEN [id number]  3LPM  Patient taking differently: Inhale 3 L/min into the lungs continuous. The patient is also smoking 5 cigarettes a day   [provider]  Active Pharmacy Records, Multiple Informants, Other  pregabalin  (LYRICA ) 200 MG capsule [id number]  Take 1 capsule (200 mg total) by mouth in the morning, at noon, and at bedtime. Josette Ade, MD  Active   thiamine  (VITAMIN B1) 100 MG tablet [id number]  Take 1 tablet (100 mg total) by mouth daily. Josette Ade, MD  Active   TRESIBA  FLEXTOUCH 100 UNIT/ML FlexTouch Pen [id number]  Inject 2 Units into the skin at bedtime. Josette Ade, MD  Active   venlafaxine  XR (EFFEXOR -XR) 150 MG 24 hr capsule [id number]  Take 300 mg by mouth daily. [provider]  Active             Recommendation:   PCP Follow-up Specialty provider follow-up Tuesday July 29th Continue Current Plan of Care  Follow Up Plan:   Telephone follow-up in 1 week  Medford Balboa, BSN, RN [place]  VBCI - [facility] RN Care Manager [phone]

## 2024-06-17 ENCOUNTER — Encounter: Payer: Self-pay | Admitting: Family

## 2024-06-17 ENCOUNTER — Ambulatory Visit: Admitting: Obstetrics and Gynecology

## 2024-06-17 ENCOUNTER — Ambulatory Visit: Admitting: Family

## 2024-06-17 VITALS — BP 108/70 | HR 85 | Ht <= 58 in | Wt 188.4 lb

## 2024-06-17 DIAGNOSIS — I10 Essential (primary) hypertension: Secondary | ICD-10-CM

## 2024-06-17 DIAGNOSIS — E538 Deficiency of other specified B group vitamins: Secondary | ICD-10-CM

## 2024-06-17 DIAGNOSIS — E1165 Type 2 diabetes mellitus with hyperglycemia: Secondary | ICD-10-CM

## 2024-06-17 DIAGNOSIS — R5383 Other fatigue: Secondary | ICD-10-CM

## 2024-06-17 DIAGNOSIS — E782 Mixed hyperlipidemia: Secondary | ICD-10-CM

## 2024-06-17 DIAGNOSIS — E559 Vitamin D deficiency, unspecified: Secondary | ICD-10-CM

## 2024-06-17 MED ORDER — OXYCODONE-ACETAMINOPHEN 7.5-325 MG PO TABS: 1.0000 | ORAL_TABLET | Freq: Four times a day (QID) | ORAL | 0 refills | Status: DC | PRN

## 2024-06-18 ENCOUNTER — Ambulatory Visit: Admitting: Obstetrics and Gynecology

## 2024-06-19 ENCOUNTER — Other Ambulatory Visit: Payer: Self-pay

## 2024-06-19 LAB — CBC WITH DIFFERENTIAL/PLATELET
Basophils Absolute: 0 x10E3/uL (ref 0.0–0.2)
Basos: 0 %
EOS (ABSOLUTE): 0.1 x10E3/uL (ref 0.0–0.4)
Eos: 2 %
Hematocrit: 38.6 % (ref 34.0–46.6)
Hemoglobin: 12.6 g/dL (ref 11.1–15.9)
Immature Grans (Abs): 0 x10E3/uL (ref 0.0–0.1)
Immature Granulocytes: 0 %
Lymphocytes Absolute: 2.2 x10E3/uL (ref 0.7–3.1)
Lymphs: 32 %
MCH: 30.7 pg (ref 26.6–33.0)
MCHC: 32.6 g/dL (ref 31.5–35.7)
MCV: 94 fL (ref 79–97)
Monocytes Absolute: 0.4 x10E3/uL (ref 0.1–0.9)
Monocytes: 6 %
Neutrophils Absolute: 4.2 x10E3/uL (ref 1.4–7.0)
Neutrophils: 60 %
Platelets: 217 x10E3/uL (ref 150–450)
RBC: 4.1 x10E6/uL (ref 3.77–5.28)
RDW: 13.3 % (ref 11.7–15.4)
WBC: 6.9 x10E3/uL (ref 3.4–10.8)

## 2024-06-19 LAB — CMP14+EGFR
ALT: 15 IU/L (ref 0–32)
AST: 30 IU/L (ref 0–40)
Albumin: 4.2 g/dL (ref 3.9–4.9)
Alkaline Phosphatase: 106 IU/L (ref 44–121)
BUN/Creatinine Ratio: 8 — ABNORMAL LOW (ref 12–28)
BUN: 7 mg/dL — ABNORMAL LOW (ref 8–27)
Bilirubin Total: 0.6 mg/dL (ref 0.0–1.2)
CO2: 20 mmol/L (ref 20–29)
Calcium: 9.5 mg/dL (ref 8.7–10.3)
Chloride: 106 mmol/L (ref 96–106)
Creatinine, Ser: 0.86 mg/dL (ref 0.57–1.00)
Globulin, Total: 2.6 g/dL (ref 1.5–4.5)
Glucose: 112 mg/dL — ABNORMAL HIGH (ref 70–99)
Potassium: 3.7 mmol/L (ref 3.5–5.2)
Sodium: 144 mmol/L (ref 134–144)
Total Protein: 6.8 g/dL (ref 6.0–8.5)
eGFR: 77 mL/min/1.73 (ref >59)

## 2024-06-19 LAB — LIPID PANEL
Chol/HDL Ratio: 2.8 ratio (ref 0.0–4.4)
Cholesterol, Total: 139 mg/dL (ref 100–199)
HDL: 50 mg/dL (ref >39)
LDL Chol Calc (NIH): 73 mg/dL (ref 0–99)
Triglycerides: 84 mg/dL (ref 0–149)
VLDL Cholesterol Cal: 16 mg/dL (ref 5–40)

## 2024-06-19 LAB — TSH: TSH: 1.44 u[IU]/mL (ref 0.450–4.500)

## 2024-06-19 LAB — VITAMIN D 25 HYDROXY (VIT D DEFICIENCY, FRACTURES): Vit D, 25-Hydroxy: 33.9 ng/mL (ref 30.0–100.0)

## 2024-06-19 LAB — VITAMIN B12: Vitamin B-12: 788 pg/mL (ref 232–1245)

## 2024-06-19 LAB — HEMOGLOBIN A1C
Est. average glucose Bld gHb Est-mCnc: 151 mg/dL
Hgb A1c MFr Bld: 6.9 % — ABNORMAL HIGH (ref 4.8–5.6)

## 2024-06-19 NOTE — Patient Instructions (Signed)
 Visit Information  Thank you for taking time to visit with me today. Please don't hesitate to contact me if I can be of assistance to you before our next scheduled telephone appointment.   Following is a copy of your care plan:   Goals Addressed             This Visit's Progress    COMPLETED: VBCI Transitions of Care (TOC) Care Plan       Problems: (reviewed 06/19/24) Recent Hospitalization for treatment of COPD Medication access barrier Insurance does not pay for Nicotine  patches and SDOH barrier: The patient lives in a trailer with no air conditioning - referral completed to SW for SDOH needs. Contact was confirmed 05/30/24 The patient has not scheduled a PCP appointment - appointment scheduled for 06/17/24 - completed  Goal:  (reviewed 06/19/24) Over the next 30 days, the patient will not experience hospital readmission  Interventions:  (reviewed 06/19/24)  COPD Interventions: Advised patient to track and manage COPD triggers Assessed social determinant of health barriers Provided education about and advised patient to utilize infection prevention strategies to reduce risk of respiratory infection Provided instruction about proper use of medications used for management of COPD including inhalers Referral made to community resources care guide team for assistance with Utilities due to the patient does not have air conditioning. She is able to stay with her daughter when there is extreme heat and cold Use of home oxygen - Continuous at 3L/min Encourage smoking cessation - 7/9  the patient states she is smoking 5 cigarettes a day Discussed coping strategies in patient's personal life that did not involve smoking cigarettes or drinking alcohol - 7/9  the patient has not had any alcohol since her last hospitalization Sleep study ordered 06/11/24 by Pulmonology Nebulizer machine ordered 06/11/24 by Pulmonology because the patient indicated hers is broken. - Referral sent to [facility]. The patient  was also referred to [facility] where nebulizers can be purchased for $20.00  Patient Self Care Activities:  (reviewed 06/19/24) Attend all scheduled provider appointments Call pharmacy for medication refills 3-7 days in advance of running out of medications Call provider office for new concerns or questions  Notify RN Care Manager of TOC call rescheduling needs Participate in Transition of Care Program/Attend TOC scheduled calls Perform all self care activities independently  Take medications as prescribed   do breathing exercises every day develop a rescue plan eliminate symptom triggers at home follow rescue plan if symptoms flare-up  Plan:  Telephone follow up appointment with care management team member scheduled for:  No further TOC Outreach. The patient has met there goal. No Hospital Readmission        Patient verbalizes understanding of instructions and care plan provided today and agrees to view in MyChart. Active MyChart status and patient understanding of how to access instructions and care plan via MyChart confirmed with patient.     The patient has been provided with contact information for the care management team and has been advised to call with any health related questions or concerns.   Please call the care guide team at [phone] if you need to cancel or reschedule your appointment.   Please call the Suicide and Crisis Lifeline: 988 call the [place]  National Suicide Prevention Lifeline: 531-785-2217 or TTY: 956-164-5536 TTY 307-095-7367) to talk to a trained counselor if you are experiencing a Mental Health or Behavioral Health Crisis or need someone to talk to.  Medford Balboa, BSN, RN [employer]  DESIREE JASMINE Population Health RN Care Manager [phone]

## 2024-06-19 NOTE — Transitions of Care (Post Inpatient/ED Visit) (Signed)
 Transition of Care Week 6  Visit Note  06/19/2024  Name: Kelly Dillon MRN: [id number]          DOB: 11/27/1960  Situation: Patient enrolled in [facility] 30-day program. Visit completed with Raoul Jubilee by telephone.   Background:   Past Medical History:  Diagnosis Date   Acute on chronic respiratory failure with hypoxia (HCC) 08/27/2021   Chronic pain syndrome    extensive - see problem list   Community acquired pneumonia of right lower lobe of lung 08/27/2021   COPD (chronic obstructive pulmonary disease) (HCC)    Diabetes mellitus without complication (HCC)    Fibromyalgia    went to [facility] for monthly lidocaine  infusions   Hypertension    Sepsis (HCC) 08/26/2021    Assessment: Patient Reported Symptoms: Cognitive Cognitive Status: Alert and oriented to person, place, and time      Neurological Neurological Review of Symptoms: Other: Oher Neurological Symptoms/Conditions [RPT]: Neuropathy Neurological Management Strategies: Medication therapy, Adequate rest  HEENT HEENT Symptoms Reported: No symptoms reported      Cardiovascular Cardiovascular Symptoms Reported: No symptoms reported Does patient have uncontrolled Hypertension?: No    Respiratory Respiratory Symptoms Reported: Dry cough Additional Respiratory Details: The patient's nebulizer is coming from [facility] which has not delivered it. The provider states [facility] pharmacy can usually provide them for $20.00. The patient states she will refill her nebulizer medicine. She was able to get a window air conditioner which she will place in her bedroom. She has two fans for the living room. She is feeling better this week. Respiratory Management Strategies: Oxygen therapy, Routine screening, Medication therapy, Activity  Endocrine Endocrine Symptoms Reported: No symptoms reported Is patient diabetic?: Yes Endocrine Comment: The patient's most recent A1c was 6.7 which is up from 6.0 three months ago   Gastrointestinal Gastrointestinal Symptoms Reported: No symptoms reported      Genitourinary Genitourinary Symptoms Reported: Incontinence Genitourinary Comment: The patient missed her appointment with Urology and it is now scheduled for 8/1  Integumentary Integumentary Symptoms Reported: No symptoms reported    Musculoskeletal Musculoskelatal Symptoms Reviewed: Joint pain, Back pain, Muscle pain Additional Musculoskeletal Details: Chronic OA. She was able to get her medication refilled Musculoskeletal Management Strategies: Medication therapy, Routine screening, Activity Musculoskeletal Comment: The patient reports some relief with back pain because she has her medication      Psychosocial Psychosocial Symptoms Reported: No symptoms reported         There were no vitals filed for this visit.  Medications Reviewed Today     Reviewed by Moises Reusing, RN (Case Manager) on 06/19/24 at 1437  Med List Status: <None>   Medication Order Taking? Sig Documenting Provider Last Dose Status Informant  albuterol  (PROVENTIL ) (2.5 MG/3ML) 0.083% nebulizer solution [id number]  Inhale 3 mLs into the lungs every 6 (six) hours as needed. Carin Gauze, NP  Active Pharmacy Records, Multiple Informants, Other           Med Note GROVER BURNARD RAMAN   Sat May 11, 2024 10:47 AM) PRN  amoxicillin-clavulanate (AUGMENTIN) 875-125 MG tablet [id number]  Take 1 tablet by mouth 2 (two) times daily. [provider]  Active   ARIPiprazole  (ABILIFY ) 10 MG tablet [id number]  Take 1 tablet (10 mg total) by mouth 2 (two) times daily. Josette Ade, MD  Active   atorvastatin  (LIPITOR) 40 MG tablet [id number]  Take 1 tablet (40 mg total) by mouth daily. [place], Hospital doctor, NP  Active Pharmacy Records, Multiple Informants, Other  Med Note GROVER BURNARD RAMAN   Sat May 11, 2024 10:47 AM) 02/22/2024 40 MG TABS (disp 90, 90d supply)  Blood Glucose Monitoring Suppl (ACCU-CHEK GUIDE ME) w/Device KIT  [id number]  Use to check blood glucose three times daily before meals and at bedtime Orlean Alan HERO, FNP  Active Pharmacy Records, Multiple Informants, Other  budesonide -glycopyrrolate-formoterol  (BREZTRI  AEROSPHERE) 160-9-4.8 MCG/ACT AERO inhaler [id number]  Inhale 2 puffs into the lungs in the morning and at bedtime. Tamea Dedra CROME, MD  Active   COMFORT EZ PEN NEEDLES 32G X 4 MM MISC [id number]  1 Needle in the morning, at noon, in the evening, and at bedtime. [provider]  Active Pharmacy Records, Multiple Informants, Other  folic acid  (FOLVITE ) 1 MG tablet [id number]  Take 1 tablet (1 mg total) by mouth daily. Josette Ade, MD  Active   glucose blood (ACCU-CHEK GUIDE) test strip [id number]  Use to check blood glucose before meals and at bedtime Orlean Alan HERO, FNP  Active Pharmacy Records, Multiple Informants, Other  Ipratropium-Albuterol  (COMBIVENT ) 20-100 MCG/ACT AERS respimat [id number]  Inhale 1 puff into the lungs every 6 (six) hours as needed. Carin Gauze, NP  Active Pharmacy Records, Multiple Informants, Other           Med Note GROVER BURNARD RAMAN   Sat May 11, 2024 10:52 AM)   02/22/2024 0.5-2.5 (3) MG/3ML SOLN (disp 270, 15d supply)    Lancets (ONETOUCH ULTRASOFT) lancets [id number]  Use to check blood sugars twice daily. Orlean Alan HERO, FNP  Active Pharmacy Records, Multiple Informants, Other  lithium  carbonate (LITHOBID ) 300 MG ER tablet [id number]  Take 1 tablet (300 mg total) by mouth every 12 (twelve) hours. Josette Ade, MD  Active   losartan  (COZAAR ) 50 MG tablet [id number]  Take 1 tablet (50 mg total) by mouth daily. Orlean Alan HERO, FNP  Active Pharmacy Records, Multiple Informants, Other           Med Note GROVER BURNARD RAMAN   Sat May 11, 2024 10:53 AM) 04/23/2024 50 MG TABS (disp 90, 90d supply)  methocarbamol  (ROBAXIN ) 500 MG tablet [id number]  Take 1 tablet (500 mg total) by mouth 3 (three) times daily. Josette Ade, MD  Active    montelukast  (SINGULAIR ) 10 MG tablet [id number]  Take 1 tablet by mouth daily. [provider]  Active Pharmacy Records, Multiple Informants, Other           Med Note GROVER BURNARD RAMAN   Sat May 11, 2024 10:54 AM)   05/10/2024 10 MG TABS (disp 90, 90d supply)    Multiple Vitamins-Minerals (CENTRUM SILVER 50+WOMEN) TABS [id number]  Take 1 capsule by mouth See admin instructions. [provider]  Active Pharmacy Records, Multiple Informants, Other  nicotine  (NICODERM CQ  - DOSED IN MG/24 HOURS) 14 mg/24hr patch [id number]  Place 1 patch (14 mg total) onto the skin daily. Josette Ade, MD  Active   nitroGLYCERIN  (NITROSTAT ) 0.4 MG SL tablet [id number]  Place 1 tablet (0.4 mg total) under the tongue every 5 (five) minutes as needed for chest pain. Carin Gauze, NP  Active Pharmacy Records, Multiple Informants, Other           Med Note GROVER BURNARD RAMAN   Sat May 11, 2024 10:55 AM) PRN  NOVOLOG  FLEXPEN 100 UNIT/ML FlexPen [id number]  Inject 7 Units into the skin 3 (three) times daily with meals. [provider]  Active Pharmacy Records, Multiple Informants, Other           Med  Note GROVER BURNARD RAMAN   Sat May 11, 2024 10:55 AM) 02/26/2024 100 UNIT/ML SOPN (disp 15, 90d supply  oxybutynin  (DITROPAN ) 5 MG tablet [id number]  Take 5 mg by mouth 3 (three) times daily. [provider]  Active   oxyCODONE -acetaminophen  (PERCOCET) 7.5-325 MG tablet [id number]  Take 1 tablet by mouth every 6 (six) hours as needed for severe pain (pain score 7-10). Orlean Alan HERO, FNP  Active   oxyCODONE -acetaminophen  (PERCOCET) 7.5-325 MG tablet [id number]  Take 1 tablet by mouth every 6 (six) hours as needed for severe pain (pain score 7-10). Orlean Alan HERO, FNP  Active   OXYGEN [id number]  3LPM  Patient taking differently: Inhale 3 L/min into the lungs continuous. The patient is also smoking 5 cigarettes a day   [provider]  Active Pharmacy Records, Multiple  Informants, Other  pregabalin  (LYRICA ) 200 MG capsule [id number]  Take 1 capsule (200 mg total) by mouth in the morning, at noon, and at bedtime. Josette Ade, MD  Active   thiamine  (VITAMIN B1) 100 MG tablet [id number]  Take 1 tablet (100 mg total) by mouth daily. Josette Ade, MD  Active   TRESIBA  FLEXTOUCH 100 UNIT/ML FlexTouch Pen [id number]  Inject 2 Units into the skin at bedtime. Josette Ade, MD  Active   venlafaxine  XR (EFFEXOR -XR) 150 MG 24 hr capsule [id number]  Take 300 mg by mouth daily. [provider]  Active             Recommendation:   Discharge from [facility]  Follow Up Plan:   Closing From:  Transitions of Care Program  [facility], BSN, RN [place]  VBCI - [facility] RN Care Manager [phone]

## 2024-06-21 ENCOUNTER — Ambulatory Visit: Admitting: Obstetrics

## 2024-06-21 ENCOUNTER — Encounter: Payer: Self-pay | Admitting: Obstetrics

## 2024-06-21 VITALS — BP 115/76 | HR 75 | Ht <= 58 in | Wt 186.2 lb

## 2024-06-21 DIAGNOSIS — R351 Nocturia: Secondary | ICD-10-CM | POA: Diagnosis not present

## 2024-06-21 DIAGNOSIS — R159 Full incontinence of feces: Secondary | ICD-10-CM | POA: Diagnosis not present

## 2024-06-21 DIAGNOSIS — Z720 Tobacco use: Secondary | ICD-10-CM

## 2024-06-21 DIAGNOSIS — E66813 Obesity, class 3: Secondary | ICD-10-CM

## 2024-06-21 DIAGNOSIS — N3946 Mixed incontinence: Secondary | ICD-10-CM

## 2024-06-21 DIAGNOSIS — N941 Unspecified dyspareunia: Secondary | ICD-10-CM | POA: Diagnosis not present

## 2024-06-21 DIAGNOSIS — Z6841 Body Mass Index (BMI) 40.0 and over, adult: Secondary | ICD-10-CM

## 2024-06-21 LAB — POCT URINALYSIS DIP (CLINITEK)
Bilirubin, UA: NEGATIVE
Blood, UA: NEGATIVE
Glucose, UA: NEGATIVE mg/dL
Ketones, POC UA: NEGATIVE mg/dL
Leukocytes, UA: NEGATIVE
Nitrite, UA: NEGATIVE
POC PROTEIN,UA: NEGATIVE
Spec Grav, UA: 1.015 (ref 1.010–1.025)
Urobilinogen, UA: 1 U/dL
pH, UA: 6.5 (ref 5.0–8.0)

## 2024-06-21 MED ORDER — GEMTESA 75 MG PO TABS: 75.0000 mg | ORAL_TABLET | Freq: Every day | ORAL | 2 refills | Status: DC

## 2024-06-21 MED ORDER — GEMTESA 75 MG PO TABS: 75.0000 mg | ORAL_TABLET | Freq: Every day | ORAL | Status: DC

## 2024-06-21 MED ORDER — TROSPIUM CHLORIDE ER 60 MG PO CP24: 1.0000 | ORAL_CAPSULE | Freq: Two times a day (BID) | ORAL | 2 refills | Status: DC

## 2024-06-21 NOTE — Assessment & Plan Note (Signed)
-   POCT UA negative, PVR 84mL - some relief with oxybutynin  alone, trospium  cost prohibitive - We discussed the symptoms of overactive bladder (OAB), which include urinary urgency, urinary frequency, nocturia, with or without urge incontinence.  While we do not know the exact etiology of OAB, several treatment options exist. We discussed management including behavioral therapy (decreasing bladder irritants, urge suppression strategies, timed voids, bladder retraining), physical therapy, medication; for refractory cases posterior tibial nerve stimulation, sacral neuromodulation, and intravesical botulinum toxin injection.  For anticholinergic medications, we discussed the potential side effects of anticholinergics including dry eyes, dry mouth, constipation, cognitive impairment and urinary retention. For Beta-3 agonist medication, we discussed the potential side effect of elevated blood pressure which is more likely to occur in individuals with uncontrolled hypertension. - discussed association with BMI and glycemic control - tried pelvic floor PT in [place]  10 years ago, desires to resume with referral placed - Rx trospium  and Gemtesa sent to [facility]. Samples of Gemtesa provided. Discussed [facility] pharmacy - For treatment of stress urinary incontinence,  non-surgical options include expectant management, weight loss, physical therapy, as well as a pessary.  Surgical options include a midurethral sling, Burch urethropexy, and transurethral injection of a bulking agent. - encouraged tobacco cessation due to coughing, COPD requiring O2

## 2024-06-21 NOTE — Assessment & Plan Note (Addendum)
-   T2DM with HbA1C 6.9 in 06/17/24 - discussed association with pelvic floor dysfunction and musculoskeletal pain - encouraged diet modification and active life style
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
## 2024-06-21 NOTE — Patient Instructions (Addendum)
 We discussed the symptoms of overactive bladder (OAB), which include urinary urgency, urinary frequency, night-time urination, with or without urge incontinence.  We discussed management including behavioral therapy (decreasing bladder irritants by following a bladder diet, urge suppression strategies, timed voids, bladder retraining), physical therapy, medication; and for refractory cases posterior tibial nerve stimulation, sacral neuromodulation, and intravesical botulinum toxin injection.   For anticholinergic medications, we discussed the potential side effects of anticholinergics including dry eyes, dry mouth, constipation, rare risks of cognitive impairment and urinary retention. You were given prescription for Trospium  to replace oxybutynin .  It can take a month to start working so give it time, but if you have bothersome side effects call sooner and we can try a different medication.  Call us  if you have trouble filling the prescription or if it's not covered by your insurance.  For Beta-3 agonist medication, we discussed the potential side effect of elevated blood pressure which is more likely to occur in individuals with uncontrolled hypertension. You were given samples for Gemtesa 75 mg.  It can take a month to start working so give it time, but if you have bothersome side effects call sooner and we can try a different medication.  Call us  if you have trouble filling the prescription or if it's not covered by your insurance.  Please visit the website below to sign up for an account. We will need an email address to send along with your prescription to verify your prescription once you have signed up.  [email or web address]  Trospium  Chloride Tablet  20mg   60 count  $14.03  For night time frequency: - avoid fluid intake 3 hours before bedtime - elevated your feet during the day or use compression socks to reduce lower extremity swelling - due to snoring, proceed  with sleep study to rule out sleep apnea  For treatment of stress urinary incontinence, which is leakage with physical activity/movement/strainging/coughing, we discussed expectant management versus nonsurgical options versus surgery. Nonsurgical options include weight loss, physical therapy, as well as a pessary.  Surgical options include a midurethral sling, which is a synthetic mesh sling that acts like a hammock under the urethra to prevent leakage of urine, a Burch urethropexy, and transurethral injection of a bulking agent.    We discussed tobacco cessation  Please call [phone] to schedule the earliest appointment for pelvic floor PT.  Women should try to eat at least 21 to 25 grams of fiber a day, while men should aim for 30 to 38 grams a day. You can add fiber to your diet with food or a fiber supplement such as psyllium (metamucil), benefiber, or fibercon.   Here's a look at how much dietary fiber is found in some common foods. When buying packaged foods, check the Nutrition Facts label for fiber content. It can vary among brands.  Fruits Serving size Total fiber (grams)*  Raspberries 1 cup 8.0  Pear 1 medium 5.5  Apple, with skin 1 medium 4.5  Banana 1 medium 3.0  Orange 1 medium 3.0  Strawberries 1 cup 3.0   Vegetables Serving size Total fiber (grams)*  Green peas, boiled 1 cup 9.0  Broccoli, boiled 1 cup chopped 5.0  Turnip greens, boiled 1 cup 5.0  Brussels sprouts, boiled 1 cup 4.0  Potato, with skin, baked 1 medium 4.0  Sweet corn, boiled 1 cup 3.5  Cauliflower, raw 1 cup chopped 2.0  Carrot, raw 1 medium 1.5   Grains Serving size Total fiber (grams)*  Spaghetti, whole-wheat, cooked  1 cup 6.0  Barley, pearled, cooked 1 cup 6.0  Bran flakes 3/4 cup 5.5  Quinoa, cooked 1 cup 5.0  Oat bran muffin 1 medium 5.0  Oatmeal, instant, cooked 1 cup 5.0  Popcorn, air-popped 3 cups 3.5  Brown rice, cooked 1 cup 3.5  Bread, whole-wheat 1 slice 2.0  Bread, rye 1 slice  2.0   Legumes, nuts and seeds Serving size Total fiber (grams)*  Split peas, boiled 1 cup 16.0  Lentils, boiled 1 cup 15.5  Black beans, boiled 1 cup 15.0  Baked beans, canned 1 cup 10.0  Chia seeds 1 ounce 10.0  Almonds 1 ounce (23 nuts) 3.5  Pistachios 1 ounce (49 nuts) 3.0  Sunflower kernels 1 ounce 3.0  *Rounded to nearest 0.5 gram. Source: [employer] for [employer]	[place]

## 2024-06-21 NOTE — Assessment & Plan Note (Signed)
-   avoid fluid intake 3 hours before bedtime - elevated your feet during the day or use compression socks to reduce lower extremity swelling - discussed association with cardiac and pulm disorders - due to snoring, pending sleep study to r/o sleep apnea

## 2024-06-21 NOTE — Assessment & Plan Note (Addendum)
-   Type I and IV stool 2-3x/day with history of diverticulosis - History of sigmoidectomy for diverticulitis  - Treatment options include anti-diarrhea medication (loperamide/ Imodium OTC or prescription lomotil), fiber supplements, physical therapy, and possible sacral neuromodulation or surgery.   - encouraged fiber supplementation and squatting position to avoid straining - referral to pelvic floor PT - consider sacral neuromodulation for refractory urinary and bowel symptoms

## 2024-06-21 NOTE — Assessment & Plan Note (Signed)
-   pending Chantix   - encouraged cessation due to COPD with chronic hypoxic respiratory failure on O2 and exacerbation of cough which worsens leakage

## 2024-06-21 NOTE — Assessment & Plan Note (Signed)
>>  ASSESSMENT AND PLAN FOR OVERACTIVE BLADDER WRITTEN ON 06/21/2024  7:22 PM BY Thedford Bunton T, MD  - POCT UA negative, PVR 84mL - some relief with oxybutynin  alone, trospium  cost prohibitive - We discussed the symptoms of overactive bladder (OAB), which include urinary urgency, urinary frequency, nocturia, with or without urge incontinence.  While we do not know the exact etiology of OAB, several treatment options exist. We discussed management including behavioral therapy (decreasing bladder irritants, urge suppression strategies, timed voids, bladder retraining), physical therapy, medication; for refractory cases posterior tibial nerve stimulation, sacral neuromodulation, and intravesical botulinum toxin injection.  For anticholinergic medications, we discussed the potential side effects of anticholinergics including dry eyes, dry mouth, constipation, cognitive impairment and urinary retention. For Beta-3 agonist medication, we discussed the potential side effect of elevated blood pressure which is more likely to occur in individuals with uncontrolled hypertension. - discussed association with BMI and glycemic control - tried pelvic floor PT in [place]  10 years ago, desires to resume with referral placed - Rx trospium  and Gemtesa sent to [facility]. Samples of Gemtesa provided. Discussed [facility] - For treatment of stress urinary incontinence,  non-surgical options include expectant management, weight loss, physical therapy, as well as a pessary.  Surgical options include a midurethral sling, Burch urethropexy, and transurethral injection of a bulking agent. - encouraged tobacco cessation due to coughing, COPD requiring O2

## 2024-06-21 NOTE — Assessment & Plan Note (Addendum)
-   fibromyalgia on opioids and chronic low back pain with sciatica - The origin of pelvic floor muscle spasm can be multifactorial, including primary, reactive to a different pain source, trauma, or even part of a centralized pain syndrome.Treatment options include pelvic floor physical therapy, local (vaginal) or oral  muscle relaxants, pelvic muscle trigger point injections or centrally acting pain medications.   - referral to pelvic floor PT

## 2024-07-05 ENCOUNTER — Other Ambulatory Visit: Payer: Self-pay | Admitting: Family

## 2024-07-08 ENCOUNTER — Other Ambulatory Visit: Payer: Self-pay | Admitting: Obstetrics

## 2024-07-08 DIAGNOSIS — N3946 Mixed incontinence: Secondary | ICD-10-CM

## 2024-07-10 ENCOUNTER — Ambulatory Visit
Admission: RE | Admit: 2024-07-10 | Discharge: 2024-07-10 | Disposition: A | Discharge status: Home / Self Care | Attending: Gastroenterology | Admitting: Gastroenterology

## 2024-07-10 ENCOUNTER — Encounter
Admission: RE | Disposition: A | Discharge status: Home / Self Care | Payer: Self-pay | Source: Home / Self Care | Attending: Gastroenterology

## 2024-07-10 ENCOUNTER — Telehealth: Payer: Self-pay

## 2024-07-10 ENCOUNTER — Ambulatory Visit: Admitting: Anesthesiology

## 2024-07-10 ENCOUNTER — Encounter: Payer: Self-pay | Admitting: Gastroenterology

## 2024-07-10 ENCOUNTER — Other Ambulatory Visit: Payer: Self-pay

## 2024-07-10 DIAGNOSIS — J449 Chronic obstructive pulmonary disease, unspecified: Secondary | ICD-10-CM | POA: Diagnosis not present

## 2024-07-10 DIAGNOSIS — R194 Change in bowel habit: Secondary | ICD-10-CM | POA: Diagnosis present

## 2024-07-10 DIAGNOSIS — E669 Obesity, unspecified: Secondary | ICD-10-CM | POA: Diagnosis not present

## 2024-07-10 DIAGNOSIS — M797 Fibromyalgia: Secondary | ICD-10-CM | POA: Insufficient documentation

## 2024-07-10 DIAGNOSIS — K5730 Diverticulosis of large intestine without perforation or abscess without bleeding: Secondary | ICD-10-CM | POA: Insufficient documentation

## 2024-07-10 DIAGNOSIS — F17210 Nicotine dependence, cigarettes, uncomplicated: Secondary | ICD-10-CM | POA: Diagnosis not present

## 2024-07-10 DIAGNOSIS — J9621 Acute and chronic respiratory failure with hypoxia: Secondary | ICD-10-CM | POA: Insufficient documentation

## 2024-07-10 DIAGNOSIS — K219 Gastro-esophageal reflux disease without esophagitis: Secondary | ICD-10-CM | POA: Insufficient documentation

## 2024-07-10 DIAGNOSIS — Z794 Long term (current) use of insulin: Secondary | ICD-10-CM | POA: Insufficient documentation

## 2024-07-10 DIAGNOSIS — E119 Type 2 diabetes mellitus without complications: Secondary | ICD-10-CM | POA: Diagnosis not present

## 2024-07-10 DIAGNOSIS — I10 Essential (primary) hypertension: Secondary | ICD-10-CM | POA: Insufficient documentation

## 2024-07-10 DIAGNOSIS — G894 Chronic pain syndrome: Secondary | ICD-10-CM | POA: Insufficient documentation

## 2024-07-10 DIAGNOSIS — K621 Rectal polyp: Secondary | ICD-10-CM | POA: Insufficient documentation

## 2024-07-10 HISTORY — PX: HEMOSTASIS CLIP PLACEMENT: SHX6857

## 2024-07-10 HISTORY — PX: COLONOSCOPY: SHX5424

## 2024-07-10 HISTORY — PX: POLYPECTOMY: SHX149

## 2024-07-10 SURGERY — COLONOSCOPY
Anesthesia: General

## 2024-07-10 MED ORDER — PROPOFOL 10 MG/ML IV BOLUS
INTRAVENOUS | Status: DC | PRN
  Administered 2024-07-10: 20 mg via INTRAVENOUS
  Administered 2024-07-10: 80 mg via INTRAVENOUS

## 2024-07-10 MED ORDER — PHENYLEPHRINE 80 MCG/ML (10ML) SYRINGE FOR IV PUSH (FOR BLOOD PRESSURE SUPPORT)
PREFILLED_SYRINGE | INTRAVENOUS | Status: DC | PRN
  Administered 2024-07-10: 80 ug via INTRAVENOUS

## 2024-07-10 MED ORDER — SODIUM CHLORIDE 0.9 % IV SOLN: INTRAVENOUS | Status: DC

## 2024-07-10 MED ORDER — PROPOFOL 500 MG/50ML IV EMUL
INTRAVENOUS | Status: DC | PRN
Start: 2024-07-10 — End: 2024-07-10
  Administered 2024-07-10: 150 ug/kg/min via INTRAVENOUS

## 2024-07-10 MED ORDER — LIDOCAINE HCL (PF) 2 % IJ SOLN
INTRAMUSCULAR | Status: AC
Start: 2024-07-10 — End: 2024-07-10
  Filled 2024-07-10: qty 5

## 2024-07-10 MED ORDER — PHENYLEPHRINE 80 MCG/ML (10ML) SYRINGE FOR IV PUSH (FOR BLOOD PRESSURE SUPPORT)
PREFILLED_SYRINGE | INTRAVENOUS | Status: AC
Start: 2024-07-10 — End: 2024-07-10
  Filled 2024-07-10: qty 10

## 2024-07-10 MED ORDER — LIDOCAINE HCL (CARDIAC) PF 100 MG/5ML IV SOSY
PREFILLED_SYRINGE | INTRAVENOUS | Status: DC | PRN
  Administered 2024-07-10: 60 mg via INTRAVENOUS

## 2024-07-10 NOTE — Transfer of Care (Signed)
 Immediate Anesthesia Transfer of Care Note  Patient: Kelly Dillon  Procedure(s) Performed: COLONOSCOPY POLYPECTOMY, INTESTINE CONTROL OF HEMORRHAGE, GI TRACT, ENDOSCOPIC, BY CLIPPING OR OVERSEWING  Patient Location: PACU and Endoscopy Unit  Anesthesia Type:MAC  Level of Consciousness: awake  Airway & Oxygen Therapy: Patient Spontanous Breathing  Post-op Assessment: Report given to RN and Post -op Vital signs reviewed and stable  Post vital signs: Reviewed and stable  Last Vitals:  Vitals Value Taken Time  BP    Temp    Pulse    Resp    SpO2      Last Pain:  Vitals:   07/10/24 0749  TempSrc: Temporal  PainSc: 0-No pain         Complications: No notable events documented.

## 2024-07-10 NOTE — Anesthesia Preprocedure Evaluation (Addendum)
 Anesthesia Evaluation  Patient identified by MRN, date of birth, ID band Patient awake    Reviewed: Allergy & Precautions, NPO status , Patient's Chart, lab work & pertinent test results  History of Anesthesia Complications Negative for: history of anesthetic complications  Airway Mallampati: II   Neck ROM: Full    Dental  (+) Edentulous Upper, Edentulous Lower   Pulmonary COPD (on 3L home O2), Current Smoker (6-7 cigarettes per day) and Patient abstained from smoking.   Pulmonary exam normal breath sounds clear to auscultation       Cardiovascular hypertension, Normal cardiovascular exam Rhythm:Regular Rate:Normal  ECG 05/13/24: NSR; ST changes, unchanged from prior   Neuro/Psych  PSYCHIATRIC DISORDERS Anxiety Depression    Chronic pain    GI/Hepatic ,GERD  ,,  Endo/Other  diabetes, Type 2  Obesity   Renal/GU negative Renal ROS     Musculoskeletal  (+) Arthritis ,  Fibromyalgia -  Abdominal   Peds  Hematology negative hematology ROS (+)   Anesthesia Other Findings   Reproductive/Obstetrics                              Anesthesia Physical Anesthesia Plan  ASA: 3  Anesthesia Plan: General   Post-op Pain Management:    Induction: Intravenous  PONV Risk Score and Plan: 2 and Propofol  infusion, TIVA and Treatment may vary due to age or medical condition  Airway Management Planned: Natural Airway  Additional Equipment:   Intra-op Plan:   Post-operative Plan:   Informed Consent: I have reviewed the patients History and Physical, chart, labs and discussed the procedure including the risks, benefits and alternatives for the proposed anesthesia with the patient or authorized representative who has indicated his/her understanding and acceptance.       Plan Discussed with: CRNA  Anesthesia Plan Comments: (History and consent obtained in Spanish. LMA/GETA backup discussed.   Patient consented for risks of anesthesia including but not limited to:  - adverse reactions to medications - damage to eyes, teeth, lips or other oral mucosa - nerve damage due to positioning  - sore throat or hoarseness - damage to heart, brain, nerves, lungs, other parts of body or loss of life  Informed patient about role of CRNA in peri- and intra-operative care.  Patient voiced understanding.)         Anesthesia Quick Evaluation

## 2024-07-10 NOTE — Telephone Encounter (Signed)
 Pharmacy wanted to verify that patient is supposed to take the combining medication of Trospium  and Gemtesa  together before they filled the prescriptions. I confirmed yes.

## 2024-07-10 NOTE — Op Note (Signed)
 [facility] Gastroenterology Patient Name: Kelly Dillon Procedure Date: 07/10/2024 8:14 AM MRN: [id number] Account #: [id number] Date of Birth: Nov 07, 1961 Admit Type: Outpatient Age: 63 Room: [facility] ENDO ROOM 3 Gender: Female Note Status: Finalized Instrument Name: Colon Scope [id number] Procedure:             Colonoscopy Indications:           Change in bowel habits Providers:             Ruel Kung MD, MD Referring MD:          Sula Sigrid Kays (Referring MD) Medicines:             Monitored Anesthesia Care Complications:         No immediate complications. Procedure:             Pre-Anesthesia Assessment:                        - Prior to the procedure, a History and Physical was                         performed, and patient medications, allergies and                         sensitivities were reviewed. The patient's tolerance                         of previous anesthesia was reviewed.                        - The risks and benefits of the procedure and the                         sedation options and risks were discussed with the                         patient. All questions were answered and informed                         consent was obtained.                        - ASA Grade Assessment: II - A patient with mild                         systemic disease.                        After obtaining informed consent, the colonoscope was                         passed under direct vision. Throughout the procedure,                         the patient's blood pressure, pulse, and oxygen                         saturations were monitored continuously. The                         Colonoscope was introduced  through the anus and                         advanced to the the cecum, identified by the                         appendiceal orifice. The colonoscopy was performed                         with ease. The patient tolerated the procedure well.                          The quality of the bowel preparation was excellent. Findings:      The perianal and digital rectal examinations were normal.      A 12 mm polyp was found in the rectum. The polyp was sessile. The polyp       was removed with a cold snare. Resection and retrieval were complete. To       prevent bleeding after the polypectomy, two hemostatic clips were       successfully placed. Clip manufacturer: [employer]. There was no       bleeding at the end of the procedure.      A single medium-mouthed diverticulum was found in the rectum.      The exam was otherwise without abnormality on direct and retroflexion       views. Impression:            - One 12 mm polyp in the rectum, removed with a cold                         snare. Resected and retrieved. Clips were placed. Clip                         manufacturer: [employer].                        - Diverticulosis in the rectum.                        - The examination was otherwise normal on direct and                         retroflexion views. Recommendation:        - Discharge patient to home (with escort).                        - Resume previous diet.                        - Continue present medications.                        - Await pathology results.                        - Repeat colonoscopy for surveillance based on                         pathology results. Procedure Code(s):     --- Professional ---  54614, Colonoscopy, flexible; with removal of                         tumor(s), polyp(s), or other lesion(s) by snare                         technique Diagnosis Code(s):     --- Professional ---                        D12.8, Benign neoplasm of rectum                        R19.4, Change in bowel habit                        K57.30, Diverticulosis of large intestine without                         perforation or abscess without bleeding CPT copyright 2022 American Medical Association. All rights  reserved. The codes documented in this report are preliminary and upon coder review may  be revised to meet current compliance requirements. Ruel Kung, MD Ruel Kung MD, MD 07/10/2024 8:49:19 AM This report has been signed electronically. Number of Addenda: 0 Note Initiated On: 07/10/2024 8:14 AM Scope Withdrawal Time: 0 hours 16 minutes 28 seconds  Total Procedure Duration: 0 hours 18 minutes 43 seconds  Estimated Blood Loss:  Estimated blood loss: none.      [facility]

## 2024-07-10 NOTE — Anesthesia Postprocedure Evaluation (Signed)
 Anesthesia Post Note  Patient: Kelly Dillon  Procedure(s) Performed: COLONOSCOPY POLYPECTOMY, INTESTINE CONTROL OF HEMORRHAGE, GI TRACT, ENDOSCOPIC, BY CLIPPING OR OVERSEWING  Patient location during evaluation: PACU Anesthesia Type: General Level of consciousness: awake and alert, oriented and patient cooperative Pain management: pain level controlled Vital Signs Assessment: post-procedure vital signs reviewed and stable Respiratory status: spontaneous breathing, nonlabored ventilation and respiratory function stable Cardiovascular status: blood pressure returned to baseline and stable Postop Assessment: adequate PO intake Anesthetic complications: no   No notable events documented.   Last Vitals:  Vitals:   07/10/24 0858 07/10/24 0909  BP: (!) 101/54 (!) 97/58  Pulse: 76 81  Resp: 16 16  Temp:    SpO2:  100%    Last Pain:  Vitals:   07/10/24 0909  TempSrc:   PainSc: 0-No pain                 Alfonso Ruths

## 2024-07-10 NOTE — H&P (Signed)
 Ruel Kung , MD [address], [place], [place], [address] Phone: [phone] Fax: [phone]  Primary Care Physician:  Sigrid Kays, Sula, MD   Pre-Procedure History & Physical: HPI:  Kelly Dillon is a 63 y.o. female is here for an colonoscopy.   Past Medical History:  Diagnosis Date   Acute on chronic respiratory failure with hypoxia (HCC) 08/27/2021   Chronic pain syndrome    extensive - see problem list   Community acquired pneumonia of right lower lobe of lung 08/27/2021   COPD (chronic obstructive pulmonary disease) (HCC)    Diabetes mellitus without complication (HCC)    Fibromyalgia    went to [facility] for monthly lidocaine  infusions   Hypertension    Sepsis (HCC) 08/26/2021    Past Surgical History:  Procedure Laterality Date   APPENDECTOMY     COLONOSCOPY     COLONOSCOPY WITH PROPOFOL  N/A 03/01/2022   Procedure: COLONOSCOPY WITH PROPOFOL ;  Surgeon: Kung Ruel, MD;  Location: [facility] ENDOSCOPY;  Service: Gastroenterology;  Laterality: N/A;   EPIDURAL BLOOD PATCH  03/20/2013   ESOPHAGEAL DILATION  09/20/2019   ESOPHAGOGASTRODUODENOSCOPY     ESOPHAGOGASTRODUODENOSCOPY N/A 03/01/2022   Procedure: ESOPHAGOGASTRODUODENOSCOPY (EGD);  Surgeon: Kung Ruel, MD;  Location: [facility] ENDOSCOPY;  Service: Gastroenterology;  Laterality: N/A;   HERNIA REPAIR     OOPHORECTOMY     TUBAL LIGATION     VENTRAL HERNIA REPAIR N/A 08/15/2018   Procedure: HERNIA REPAIR VENTRAL ADULT;  Surgeon: Tye Millet, DO;  Location: [facility] ORS;  Service: General;  Laterality: N/A;   VENTRAL HERNIA REPAIR N/A 08/18/2018   Procedure: LAPAROSCOPIC VENTRAL HERNIA;  Surgeon: Tye Millet, DO;  Location: [facility] ORS;  Service: General;  Laterality: N/A;    Prior to Admission medications   Medication Sig Start Date End Date Taking? Authorizing Provider   albuterol  (PROVENTIL ) (2.5 MG/3ML) 0.083% nebulizer solution Inhale 3 mLs into the lungs every 6 (six) hours as needed. 08/14/23   Scoggins, Amber, NP  ARIPiprazole  (ABILIFY ) 10 MG tablet Take 1 tablet (10 mg total) by mouth 2 (two) times daily. 05/14/24   Josette Ade, MD  atorvastatin  (LIPITOR) 40 MG tablet Take 1 tablet (40 mg total) by mouth daily. 08/14/23   Scoggins, Hospital doctor, NP  Blood Glucose Monitoring Suppl (ACCU-CHEK GUIDE ME) w/Device KIT Use to check blood glucose three times daily before meals and at bedtime 09/25/23   Orlean Alan HERO, FNP  budesonide -glycopyrrolate-formoterol  (BREZTRI  AEROSPHERE) 160-9-4.8 MCG/ACT AERO inhaler Inhale 2 puffs into the lungs in the morning and at bedtime. 06/11/24   Tamea Dedra CROME, MD  COMFORT EZ PEN NEEDLES 32G X 4 MM MISC 1 Needle in the morning, at noon, in the evening, and at bedtime. 05/13/22   [provider]  folic acid  (FOLVITE ) 1 MG tablet Take 1 tablet (1 mg total) by mouth daily. 05/14/24   Josette Ade, MD  glucose blood (ACCU-CHEK GUIDE) test strip Use to check blood glucose before meals and at bedtime 09/25/23   Orlean Alan HERO, FNP  Ipratropium-Albuterol  (COMBIVENT ) 20-100 MCG/ACT AERS respimat Inhale 1 puff into the lungs every 6 (six) hours as needed. 08/14/23   Scoggins, Amber, NP  Lancets (ONETOUCH ULTRASOFT) lancets Use to check blood sugars twice daily. 11/10/23   Orlean Alan HERO, FNP  lithium  carbonate (LITHOBID ) 300 MG ER tablet Take 1 tablet (300 mg total) by mouth every 12 (twelve) hours. 05/14/24   Josette Ade, MD  losartan  (COZAAR ) 50 MG tablet Take 1 tablet (  50 mg total) by mouth daily. 01/12/24   Orlean Alan HERO, FNP  methocarbamol  (ROBAXIN ) 500 MG tablet Take 1 tablet (500 mg total) by mouth 3 (three) times daily. 05/14/24   Josette Ade, MD  montelukast  (SINGULAIR ) 10 MG tablet Take 1 tablet by mouth daily.    [provider]  MOUNJARO 2.5 MG/0.5ML Pen SMARTSIG:1 auto-injector SUB-Q Once a  Week 06/17/24   [provider]  Multiple Vitamins-Minerals (CENTRUM SILVER 50+WOMEN) TABS Take 1 capsule by mouth See admin instructions.    [provider]  nitroGLYCERIN  (NITROSTAT ) 0.4 MG SL tablet Place 1 tablet (0.4 mg total) under the tongue every 5 (five) minutes as needed for chest pain. 08/14/23 08/13/24  Scoggins, Amber, NP  NOVOLOG  FLEXPEN 100 UNIT/ML FlexPen Inject 7 Units into the skin 3 (three) times daily with meals. 02/26/24   [provider]  oxyCODONE -acetaminophen  (PERCOCET) 7.5-325 MG tablet Take 1 tablet by mouth every 6 (six) hours as needed for severe pain (pain score 7-10). Patient not taking: Reported on 06/21/2024 07/17/24   Orlean Alan HERO, FNP  oxyCODONE -acetaminophen  (PERCOCET) 7.5-325 MG tablet Take 1 tablet by mouth every 6 (six) hours as needed for severe pain (pain score 7-10). 06/17/24   Orlean Alan HERO, FNP  OXYGEN 3LPM    [provider]  pregabalin  (LYRICA ) 200 MG capsule TAKE 1 CAPSULE BY MOUTH IN THE MORNING, AT NOON, AND AT BEDTIME. 07/09/24   Fernand Fredy RAMAN, MD  traZODone  (DESYREL ) 100 MG tablet Take 200 mg by mouth at bedtime.    [provider]  TRESIBA  FLEXTOUCH 100 UNIT/ML FlexTouch Pen Inject 2 Units into the skin at bedtime. 05/14/24   Josette Ade, MD  Trospium  Chloride 60 MG CP24 Take 1 capsule (60 mg total) by mouth in the morning and at bedtime. 06/21/24   Guadlupe Lianne DASEN, MD  venlafaxine  XR (EFFEXOR -XR) 150 MG 24 hr capsule Take 300 mg by mouth daily. 06/07/24   [provider]  Vibegron  (GEMTESA ) 75 MG TABS Take 1 tablet (75 mg total) by mouth daily. 06/21/24   Guadlupe Lianne T, MD  Vibegron  (GEMTESA ) 75 MG TABS TOME 1 TABLETA DIARIAMENTE. 07/09/24   Guadlupe Lianne T, MD    Allergies as of 07/03/2024   (No Known Allergies)    No family history on file.  Social History   Socioeconomic History   Marital status: Divorced    Spouse name: Not on file   Number of children: Not on file   Years of education: Not  on file   Highest education level: Not on file  Occupational History   Not on file  Tobacco Use   Smoking status: Every Day    Current packs/day: 1.00    Types: Cigarettes   Smokeless tobacco: Never   Tobacco comments:    SMOKES 2-3 CIGARETTES QD  Vaping Use   Vaping status: Never Used  Substance and Sexual Activity   Alcohol use: Yes    Comment: gallon of liqour per day x 2 weeks   Drug use: Yes    Comment: CHRONIC PAIN ,ON PERCOCET   Sexual activity: Not Currently  Other Topics Concern   Not on file  Social History Narrative   Not on file   Social Drivers of Health   Financial Resource Strain: High Risk (06/24/2024)   Received from [facility]   Overall Financial Resource Strain (CARDIA)    Difficulty of Paying Living Expenses: Hard  Food Insecurity: Food Insecurity Present (06/24/2024)  Received from [facility]   Hunger Vital Sign    Within the past 12 months, you worried that your food would run out before you got the money to buy more.: Sometimes true    Within the past 12 months, the food you bought just didn't last and you didn't have money to get more.: Never true  Transportation Needs: No Transportation Needs (06/24/2024)   Received from [facility] - Transportation    In the past 12 months, has lack of transportation kept you from medical appointments or from getting medications?: No    Lack of Transportation (Non-Medical): No  Physical Activity: Insufficiently Active (08/21/2019)   Received from [facility]   Exercise Vital Sign    On average, how many days per week do you engage in moderate to strenuous exercise (like a brisk walk)?: 4 days    On average, how many minutes do you engage in exercise at this level?: 30 min  Stress: Stress Concern Present (08/21/2019)   Received from [facility] - Occupational Stress  Questionnaire    Feeling of Stress : Very much  Social Connections: Unknown (08/21/2019)   Received from [facility]   Social Connection and Isolation Panel    In a typical week, how many times do you talk on the phone with family, friends, or neighbors?: Once a week    How often do you get together with friends or relatives?: Once a week    How often do you attend church or religious services?: More than 4 times per year    Active Member of Clubs or Organizations: Not on file    Attends Banker Meetings: Not on file    Marital Status: Not on file  Intimate Partner Violence: Not At Risk (05/15/2024)   Humiliation, Afraid, Rape, and Kick questionnaire    Fear of Current or Ex-Partner: No    Emotionally Abused: No    Physically Abused: No    Sexually Abused: No    Review of Systems: See HPI, otherwise negative ROS  Physical Exam: There were no vitals taken for this visit. General:   Alert,  pleasant and cooperative in NAD Head:  Normocephalic and atraumatic. Neck:  Supple; no masses or thyromegaly. Lungs:  Clear throughout to auscultation, normal respiratory effort.    Heart:  +S1, +S2, Regular rate and rhythm, No edema. Abdomen:  Soft, nontender and nondistended. Normal bowel sounds, without guarding, and without rebound.   Neurologic:  Alert and  oriented x4;  grossly normal neurologically.  Impression/Plan: Kelly Dillon is here for an colonoscopy to be performed for change in bowel habits.   Risks, benefits, limitations, and alternatives regarding  colonoscopy have been reviewed with the patient.  Questions have been answered.  All parties agreeable.   Ruel Kung, MD  07/10/2024, 7:43 AM

## 2024-07-11 LAB — SURGICAL PATHOLOGY

## 2024-07-17 ENCOUNTER — Ambulatory Visit: Attending: Sleep Medicine

## 2024-07-17 DIAGNOSIS — Z9189 Other specified personal risk factors, not elsewhere classified: Secondary | ICD-10-CM | POA: Diagnosis present

## 2024-07-17 DIAGNOSIS — G4733 Obstructive sleep apnea (adult) (pediatric): Secondary | ICD-10-CM | POA: Insufficient documentation

## 2024-07-17 DIAGNOSIS — R0683 Snoring: Secondary | ICD-10-CM | POA: Insufficient documentation

## 2024-07-23 ENCOUNTER — Ambulatory Visit: Payer: Self-pay

## 2024-07-24 ENCOUNTER — Ambulatory Visit (INDEPENDENT_AMBULATORY_CARE_PROVIDER_SITE_OTHER): Payer: Self-pay | Admitting: Pulmonary Disease

## 2024-07-24 DIAGNOSIS — J449 Chronic obstructive pulmonary disease, unspecified: Secondary | ICD-10-CM

## 2024-07-24 DIAGNOSIS — R0683 Snoring: Secondary | ICD-10-CM

## 2024-07-24 DIAGNOSIS — R0609 Other forms of dyspnea: Secondary | ICD-10-CM

## 2024-07-24 NOTE — Telephone Encounter (Signed)
-----   Message from Dedra Sanders sent at 07/24/2024 10:13 AM EDT ----- Sleep study does not show sleep apnea.  She does have some low oxygen levels and will need to do an overnight oximetry to do a formal qualification for oxygen.  She does have loud snoring without  apnea and she can be referred to a dentist for an oral appliance if she is interested. ----- Message ----- From: [name] Sent: 07/24/2024   8:18 AM EDT To: [name], MD

## 2024-07-24 NOTE — Addendum Note (Signed)
 Addended by: ROGELIA LARAY RAMAN on: 07/24/2024 04:08 PM   Modules accepted: Orders

## 2024-07-24 NOTE — Telephone Encounter (Signed)
 Patient advised. Patient agreed to overnight oximetry. Will place DME order. NFN.

## 2024-07-25 NOTE — Addendum Note (Signed)
 Addended by: ROGELIA LARAY RAMAN on: 07/25/2024 08:43 AM   Modules accepted: Orders

## 2024-08-02 ENCOUNTER — Ambulatory Visit: Admitting: Cardiology

## 2024-08-02 ENCOUNTER — Ambulatory Visit: Admitting: Cardiovascular Disease

## 2024-08-02 ENCOUNTER — Encounter: Payer: Self-pay | Admitting: Cardiovascular Disease

## 2024-08-02 VITALS — BP 100/62 | HR 105 | Ht <= 58 in | Wt 170.4 lb

## 2024-08-02 DIAGNOSIS — R9431 Abnormal electrocardiogram [ECG] [EKG]: Secondary | ICD-10-CM

## 2024-08-02 DIAGNOSIS — E782 Mixed hyperlipidemia: Secondary | ICD-10-CM

## 2024-08-02 DIAGNOSIS — E1165 Type 2 diabetes mellitus with hyperglycemia: Secondary | ICD-10-CM

## 2024-08-02 DIAGNOSIS — R0789 Other chest pain: Secondary | ICD-10-CM

## 2024-08-02 DIAGNOSIS — F17210 Nicotine dependence, cigarettes, uncomplicated: Secondary | ICD-10-CM

## 2024-08-02 DIAGNOSIS — I483 Typical atrial flutter: Secondary | ICD-10-CM | POA: Diagnosis not present

## 2024-08-02 DIAGNOSIS — R55 Syncope and collapse: Secondary | ICD-10-CM

## 2024-08-02 DIAGNOSIS — I10 Essential (primary) hypertension: Secondary | ICD-10-CM | POA: Diagnosis not present

## 2024-08-02 DIAGNOSIS — Z794 Long term (current) use of insulin: Secondary | ICD-10-CM

## 2024-08-02 DIAGNOSIS — Z720 Tobacco use: Secondary | ICD-10-CM

## 2024-08-02 NOTE — Progress Notes (Signed)
 Cardiology Office Note   Date:  08/02/2024   ID:  Kelly Dillon, Kelly Dillon 27-May-1961, MRN [id number]  PCP:  Sigrid Deidra Fox, MD  Cardiologist:  Denyse Bathe, MD      History of Present Illness: Kelly Dillon is a 63 y.o. female who presents for  Chief Complaint  Patient presents with   Consult    Cardiac consult    63 YOHF, gets palpitation and chest pain.  Patient was seen on 05/11/2024 in the emergency room for alcohol abuse and suicidal ideation.  At that time she was in SVT probably atrial flutter at 142 bpm.  Patient had a follow-up EKG the next day which showed ST elevation in the inferior leads and lateral leads about 1 mm considered to be acute injury but had no troponin checked at that time.  She has been having intermittent chest pain since discharge and palpitation associated with shortness of breath.      Past Medical History:  Diagnosis Date   Acute on chronic respiratory failure with hypoxia (HCC) 08/27/2021   Chronic pain syndrome    extensive - see problem list   Community acquired pneumonia of right lower lobe of lung 08/27/2021   COPD (chronic obstructive pulmonary disease) (HCC)    Diabetes mellitus without complication (HCC)    Fibromyalgia    went to [facility] for monthly lidocaine  infusions   Hypertension    Sepsis (HCC) 08/26/2021     Past Surgical History:  Procedure Laterality Date   APPENDECTOMY     COLONOSCOPY     COLONOSCOPY N/A 07/10/2024   Procedure: COLONOSCOPY;  Surgeon: Therisa Bi, MD;  Location: [facility] ENDOSCOPY;  Service: Gastroenterology;  Laterality: N/A;  IDDM   Spanish Interpreter needed   COLONOSCOPY WITH PROPOFOL  N/A 03/01/2022   Procedure: COLONOSCOPY WITH PROPOFOL ;  Surgeon: Therisa Bi, MD;  Location: [facility] ENDOSCOPY;  Service: Gastroenterology;  Laterality: N/A;   EPIDURAL BLOOD PATCH  03/20/2013   ESOPHAGEAL DILATION  09/20/2019   ESOPHAGOGASTRODUODENOSCOPY     ESOPHAGOGASTRODUODENOSCOPY N/A 03/01/2022    Procedure: ESOPHAGOGASTRODUODENOSCOPY (EGD);  Surgeon: Therisa Bi, MD;  Location: [facility] ENDOSCOPY;  Service: Gastroenterology;  Laterality: N/A;   HEMOSTASIS CLIP PLACEMENT  07/10/2024   Procedure: CONTROL OF HEMORRHAGE, GI TRACT, ENDOSCOPIC, BY CLIPPING OR OVERSEWING;  Surgeon: Therisa Bi, MD;  Location: [facility] ENDOSCOPY;  Service: Gastroenterology;;   HERNIA REPAIR     OOPHORECTOMY     POLYPECTOMY  07/10/2024   Procedure: POLYPECTOMY, INTESTINE;  Surgeon: Therisa Bi, MD;  Location: [facility] ENDOSCOPY;  Service: Gastroenterology;;   TUBAL LIGATION     VENTRAL HERNIA REPAIR N/A 08/15/2018   Procedure: HERNIA REPAIR VENTRAL ADULT;  Surgeon: Tye Millet, DO;  Location: [facility] ORS;  Service: General;  Laterality: N/A;   VENTRAL HERNIA REPAIR N/A 08/18/2018   Procedure: LAPAROSCOPIC VENTRAL HERNIA;  Surgeon: Tye Millet, DO;  Location: [facility] ORS;  Service: General;  Laterality: N/A;     Current Outpatient Medications  Medication Sig Dispense Refill   albuterol  (PROVENTIL ) (2.5 MG/3ML) 0.083% nebulizer solution Inhale 3 mLs into the lungs every 6 (six) hours as needed. 75 mL 12   atorvastatin  (LIPITOR) 40 MG tablet Take 1 tablet (40 mg total) by mouth daily. 90 tablet 1   Blood Glucose Monitoring Suppl (ACCU-CHEK GUIDE ME) w/Device KIT Use to check blood glucose three times daily before meals and at bedtime 1 kit 0   budesonide -glycopyrrolate-formoterol  (BREZTRI  AEROSPHERE) 160-9-4.8 MCG/ACT AERO inhaler Inhale 2 puffs into the lungs in  the morning and at bedtime. 2 each 0   COMFORT EZ PEN NEEDLES 32G X 4 MM MISC 1 Needle in the morning, at noon, in the evening, and at bedtime.     folic acid  (FOLVITE ) 1 MG tablet Take 1 tablet (1 mg total) by mouth daily. 30 tablet 0   glucose blood (ACCU-CHEK GUIDE) test strip Use to check blood glucose before meals and at bedtime 100 each 12   Ipratropium-Albuterol  (COMBIVENT ) 20-100 MCG/ACT AERS respimat Inhale 1 puff into the lungs every 6 (six) hours as needed.  4 g 4   Lancets (ONETOUCH ULTRASOFT) lancets Use to check blood sugars twice daily. 200 each 2   lithium  carbonate (LITHOBID ) 300 MG ER tablet Take 1 tablet (300 mg total) by mouth every 12 (twelve) hours. 60 tablet 0   losartan  (COZAAR ) 50 MG tablet Take 1 tablet (50 mg total) by mouth daily. 90 tablet 1   methocarbamol  (ROBAXIN ) 500 MG tablet Take 1 tablet (500 mg total) by mouth 3 (three) times daily. 90 tablet 0   montelukast  (SINGULAIR ) 10 MG tablet Take 1 tablet by mouth daily.     MOUNJARO 2.5 MG/0.5ML Pen SMARTSIG:1 auto-injector SUB-Q Once a Week     Multiple Vitamins-Minerals (CENTRUM SILVER 50+WOMEN) TABS Take 1 capsule by mouth See admin instructions.     nitroGLYCERIN  (NITROSTAT ) 0.4 MG SL tablet Place 1 tablet (0.4 mg total) under the tongue every 5 (five) minutes as needed for chest pain. 100 tablet 3   NOVOLOG  FLEXPEN 100 UNIT/ML FlexPen Inject 7 Units into the skin 3 (three) times daily with meals.     oxyCODONE -acetaminophen  (PERCOCET) 7.5-325 MG tablet Take 1 tablet by mouth every 6 (six) hours as needed for severe pain (pain score 7-10). 30 tablet 0   OXYGEN 3LPM     pregabalin  (LYRICA ) 200 MG capsule TAKE 1 CAPSULE BY MOUTH IN THE MORNING, AT NOON, AND AT BEDTIME. 90 capsule 0   traZODone  (DESYREL ) 100 MG tablet Take 200 mg by mouth at bedtime.     TRESIBA  FLEXTOUCH 100 UNIT/ML FlexTouch Pen Inject 2 Units into the skin at bedtime.     Trospium  Chloride 60 MG CP24 Take 1 capsule (60 mg total) by mouth in the morning and at bedtime. 180 capsule 2   venlafaxine  XR (EFFEXOR -XR) 150 MG 24 hr capsule Take 300 mg by mouth daily.     Vibegron  (GEMTESA ) 75 MG TABS Take 1 tablet (75 mg total) by mouth daily. 14 tablet    ARIPiprazole  (ABILIFY ) 10 MG tablet Take 1 tablet (10 mg total) by mouth 2 (two) times daily. (Patient not taking: Reported on 08/02/2024) 60 tablet 0   oxyCODONE -acetaminophen  (PERCOCET) 7.5-325 MG tablet Take 1 tablet by mouth every 6 (six) hours as needed for severe  pain (pain score 7-10). (Patient not taking: Reported on 08/02/2024) 30 tablet 0   Vibegron  (GEMTESA ) 75 MG TABS TOME 1 TABLETA DIARIAMENTE. 30 tablet 2   No current facility-administered medications for this visit.    Allergies:   Patient has no known allergies.    Social History:   reports that she has been smoking cigarettes. She has never used smokeless tobacco. She reports that she does not currently use alcohol. She reports current drug use.   Family History:  family history is not on file.    ROS:     Review of Systems  Constitutional: Negative.   HENT: Negative.    Eyes: Negative.   Respiratory: Negative.    Gastrointestinal:  Negative.   Genitourinary: Negative.   Musculoskeletal: Negative.   Skin: Negative.   Neurological: Negative.   Endo/Heme/Allergies: Negative.   Psychiatric/Behavioral: Negative.    All other systems reviewed and are negative.     All other systems are reviewed and negative.    PHYSICAL EXAM: VS:  BP 100/62   Pulse (!) 105   Ht 4' 9 (1.448 m)   Wt 170 lb 6.4 oz (77.3 kg)   SpO2 97%   BMI 36.87 kg/m  , BMI Body mass index is 36.87 kg/m. Last weight:  Wt Readings from Last 3 Encounters:  08/02/24 170 lb 6.4 oz (77.3 kg)  07/10/24 173 lb 3.2 oz (78.6 kg)  06/21/24 186 lb 3.2 oz (84.5 kg)     Physical Exam Constitutional:      Appearance: Normal appearance.  Cardiovascular:     Rate and Rhythm: Normal rate and regular rhythm.     Heart sounds: Normal heart sounds.  Pulmonary:     Effort: Pulmonary effort is normal.     Breath sounds: Normal breath sounds.  Musculoskeletal:     Right lower leg: No edema.     Left lower leg: No edema.  Neurological:     Mental Status: She is alert.       EKG:   Recent Labs: 05/10/2024: Magnesium  2.3 06/17/2024: ALT 15; BUN 7; Creatinine, Ser 0.86; Hemoglobin 12.6; Platelets 217; Potassium 3.7; Sodium 144; TSH 1.440    Lipid Panel    Component Value Date/Time   CHOL 139 06/17/2024  1529   TRIG 84 06/17/2024 1529   HDL 50 06/17/2024 1529   CHOLHDL 2.8 06/17/2024 1529   LDLCALC 73 06/17/2024 1529      Other studies Reviewed: Additional studies/ records that were reviewed today include:  Review of the above records demonstrates:       No data to display            ASSESSMENT AND PLAN:    ICD-10-CM   1. Essential hypertension  I10 PCV ECHOCARDIOGRAM COMPLETE    MYOCARDIAL PERFUSION IMAGING    2. Tobacco abuse  Z72.0 PCV ECHOCARDIOGRAM COMPLETE    MYOCARDIAL PERFUSION IMAGING    3. Mixed hyperlipidemia  E78.2 PCV ECHOCARDIOGRAM COMPLETE    MYOCARDIAL PERFUSION IMAGING    4. Uncontrolled type 2 diabetes mellitus with hyperglycemia, with long-term current use of insulin  (HCC)  E11.65 PCV ECHOCARDIOGRAM COMPLETE   Z79.4 MYOCARDIAL PERFUSION IMAGING    5. Other chest pain  R07.89 PCV ECHOCARDIOGRAM COMPLETE    MYOCARDIAL PERFUSION IMAGING    6. Syncope, unspecified syncope type  R55 PCV ECHOCARDIOGRAM COMPLETE    MYOCARDIAL PERFUSION IMAGING    7. Typical atrial flutter (HCC)  I48.3 PCV ECHOCARDIOGRAM COMPLETE    MYOCARDIAL PERFUSION IMAGING   Supraventricular tachycardia most likely atrial flutter at 142 bpm noted on 05/10/2024.  She had a follow-up EKG with ST elevation in the inferior leads.    8. Abnormal electrocardiogram (ECG) (EKG)  R94.31    Had SVT and ST elevation on EKGs in the inferior and lateral leads in June this year in the emergency room.  As no workup was done, Advise echo and stress test.       Problem List Items Addressed This Visit       Cardiovascular and Mediastinum   Essential hypertension - Primary   Relevant Orders   PCV ECHOCARDIOGRAM COMPLETE   MYOCARDIAL PERFUSION IMAGING     Endocrine   Uncontrolled type 2 diabetes  mellitus with hyperglycemia, with long-term current use of insulin  (HCC)   Relevant Orders   PCV ECHOCARDIOGRAM COMPLETE   MYOCARDIAL PERFUSION IMAGING     Other   Tobacco abuse   Relevant Orders    PCV ECHOCARDIOGRAM COMPLETE   MYOCARDIAL PERFUSION IMAGING   Hyperlipidemia   Relevant Orders   PCV ECHOCARDIOGRAM COMPLETE   MYOCARDIAL PERFUSION IMAGING   Other Visit Diagnoses       Other chest pain       Relevant Orders   PCV ECHOCARDIOGRAM COMPLETE   MYOCARDIAL PERFUSION IMAGING     Syncope, unspecified syncope type       Relevant Orders   PCV ECHOCARDIOGRAM COMPLETE   MYOCARDIAL PERFUSION IMAGING     Typical atrial flutter (HCC)       Supraventricular tachycardia most likely atrial flutter at 142 bpm noted on 05/10/2024.  She had a follow-up EKG with ST elevation in the inferior leads.   Relevant Orders   PCV ECHOCARDIOGRAM COMPLETE   MYOCARDIAL PERFUSION IMAGING     Abnormal electrocardiogram (ECG) (EKG)       Had SVT and ST elevation on EKGs in the inferior and lateral leads in June this year in the emergency room.  As no workup was done, Advise echo and stress test.          Disposition:   Return in about 3 weeks (around 08/23/2024) for echo, stress test next week if possible.    Total time spent: 50 minutes  Signed,  Denyse Bathe, MD  08/02/2024 12:20 PM    [facility]

## 2024-08-09 ENCOUNTER — Ambulatory Visit: Admission: RE | Admit: 2024-08-09 | Source: Ambulatory Visit

## 2024-08-16 ENCOUNTER — Ambulatory Visit: Admitting: Pulmonary Disease

## 2024-08-16 ENCOUNTER — Encounter: Payer: Self-pay | Admitting: Pulmonary Disease

## 2024-08-16 VITALS — BP 118/80 | HR 81 | Temp 97.6°F | Ht <= 58 in | Wt 166.0 lb

## 2024-08-16 DIAGNOSIS — R0902 Hypoxemia: Secondary | ICD-10-CM

## 2024-08-16 DIAGNOSIS — R0609 Other forms of dyspnea: Secondary | ICD-10-CM

## 2024-08-16 DIAGNOSIS — E669 Obesity, unspecified: Secondary | ICD-10-CM | POA: Diagnosis not present

## 2024-08-16 DIAGNOSIS — J449 Chronic obstructive pulmonary disease, unspecified: Secondary | ICD-10-CM

## 2024-08-16 DIAGNOSIS — F17210 Nicotine dependence, cigarettes, uncomplicated: Secondary | ICD-10-CM

## 2024-08-16 DIAGNOSIS — J4489 Other specified chronic obstructive pulmonary disease: Secondary | ICD-10-CM

## 2024-08-16 DIAGNOSIS — G4736 Sleep related hypoventilation in conditions classified elsewhere: Secondary | ICD-10-CM

## 2024-08-16 DIAGNOSIS — Z23 Encounter for immunization: Secondary | ICD-10-CM

## 2024-08-16 DIAGNOSIS — Z6835 Body mass index (BMI) 35.0-35.9, adult: Secondary | ICD-10-CM

## 2024-08-16 LAB — PULMONARY FUNCTION TEST
DL/VA % pred: 99 %
DL/VA: 4.41 ml/min/mmHg/L
DLCO unc % pred: 74 %
DLCO unc: 11.52 ml/min/mmHg
FEF 25-75 Post: 1.41 L/s
FEF 25-75 Pre: 1.26 L/s
FEF2575-%Change-Post: 11 %
FEF2575-%Pred-Post: 73 %
FEF2575-%Pred-Pre: 65 %
FEV1-%Change-Post: 2 %
FEV1-%Pred-Post: 61 %
FEV1-%Pred-Pre: 59 %
FEV1-Post: 1.16 L
FEV1-Pre: 1.14 L
FEV1FVC-%Change-Post: 1 %
FEV1FVC-%Pred-Pre: 106 %
FEV6-%Change-Post: 1 %
FEV6-%Pred-Post: 58 %
FEV6-%Pred-Pre: 57 %
FEV6-Post: 1.39 L
FEV6-Pre: 1.37 L
FEV6FVC-%Pred-Post: 104 %
FEV6FVC-%Pred-Pre: 104 %
FVC-%Change-Post: 1 %
FVC-%Pred-Post: 56 %
FVC-%Pred-Pre: 55 %
FVC-Post: 1.39 L
FVC-Pre: 1.37 L
Post FEV1/FVC ratio: 84 %
Post FEV6/FVC ratio: 100 %
Pre FEV1/FVC ratio: 83 %
Pre FEV6/FVC Ratio: 100 %
RV % pred: 83 %
RV: 1.38 L
TLC % pred: 71 %
TLC: 2.88 L

## 2024-08-16 MED ORDER — ALBUTEROL SULFATE HFA 108 (90 BASE) MCG/ACT IN AERS: 2.0000 | INHALATION_SPRAY | Freq: Four times a day (QID) | RESPIRATORY_TRACT | 2 refills | Status: AC | PRN

## 2024-08-16 MED ORDER — BREZTRI AEROSPHERE 160-9-4.8 MCG/ACT IN AERO: 2.0000 | INHALATION_SPRAY | Freq: Two times a day (BID) | RESPIRATORY_TRACT | 11 refills | Status: AC

## 2024-08-16 MED ORDER — BREZTRI AEROSPHERE 160-9-4.8 MCG/ACT IN AERO: 2.0000 | INHALATION_SPRAY | Freq: Two times a day (BID) | RESPIRATORY_TRACT | Status: AC

## 2024-08-16 NOTE — Progress Notes (Signed)
 Full PFT completed today ? ?

## 2024-08-16 NOTE — Patient Instructions (Signed)
 Full PFT completed today ? ?

## 2024-08-16 NOTE — Patient Instructions (Addendum)
 VISIT SUMMARY:  Today, we discussed your chronic obstructive pulmonary disease (COPD), your efforts to quit smoking, and your recent weight loss. We reviewed your current medications and made some adjustments to better manage your symptoms.  YOUR PLAN:  -CHRONIC OBSTRUCTIVE PULMONARY DISEASE (COPD) WITH CHRONIC BRONCHITIS AND NOCTURNAL HYPOXEMIA: COPD is a chronic lung condition that makes it hard to breathe. Chronic bronchitis is a type of COPD that involves long-term inflammation of the airways. Nocturnal hypoxemia means your oxygen levels drop at night. We will continue your Breztri  inhaler with two inhalations twice daily and provide samples. We are also prescribing an inhaler for emergency use (albuterol ) and arranging for nocturnal oxygen therapy at 2 L/min to help with your nighttime oxygen levels.  -TOBACCO USE: Smoking can worsen your COPD and overall lung health. You are currently smoking one pack every three days. We strongly advise you to continue your efforts to quit smoking to improve your respiratory health.  -OBESITY: Excess weight can make it harder to breathe deeply. You have lost 22 pounds recently, which has helped your breathing. We encourage you to continue your weight loss efforts to further benefit your respiratory function.  -NEED FOR FLU VACCINE: You receive your flu vaccine today.  INSTRUCTIONS:  Please continue using your Breztri  inhaler as prescribed and use the albuterol  in case of emergencies.  Continue nocturnal oxygen therapy at 2 L/min. Keep up your efforts to quit smoking and continue with your weight loss plan. Follow up with us  if you experience any worsening of symptoms or have any concerns.  We will see you in follow-up in 2 months otherwise.   RESUMEN DE LA VISITA:  Hoy hablamos sobre su enfermedad pulmonar obstructiva crnica (EPOC), sus esfuerzos por dejar de fumar y su reciente prdida de peso. Revisamos su medicacin actual e hicimos algunos ajustes  para controlar mejor sus sntomas.  SU PLAN:  - ENFERMEDAD PULMONAR OBSTRUCTIVA CRNICA (EPOC) CON BRONQUITIS CRNICA E HIPOXEMIA NOCTURNA: La EPOC es una afeccin pulmonar crnica que dificulta la respiracin. La bronquitis crnica es un tipo de EPOC que implica una inflamacin prolongada de las vas respiratorias. La hipoxemia nocturna significa que sus niveles de oxgeno disminuyen durante la noche. Continuaremos con su inhalador Breztri  con [employer] veces al da y le proporcionaremos [place]am. Tambin le recetaremos un inhalador de Associate Professor (albuterol ) y le programaremos oxigenoterapia nocturna a 2 l/min para ayudarle a Pharmacologist sus niveles de oxgeno nocturnos.  - CONSUMO DE TABACO: Fumar puede empeorar su EPOC y su salud pulmonar en general. Actualmente fuma un paquete cada tres das. Le recomendamos encarecidamente que contine con sus esfuerzos para dejar de fumar y as Adult nurse.  -OBESIDAD: El sobrepeso puede dificultar la respiracin profunda. Recientemente ha perdido 10 kilos, lo que le ha ayudado a Industrial/product designer. Le animamos a que contine con sus esfuerzos para bajar de peso y as Scientist, clinical (histocompatibility and immunogenetics) an ms su funcin respiratoria.  -NECESITA LA VACUNA CONTRA LA GRIPE: Reciba su vacuna contra la gripe hoy.  INSTRUCCIONES:  Contine usando su inhalador Breztri  segn lo prescrito y use albuterol  en caso de emergencia. Contine con la oxigenoterapia nocturna a 2 l/min. Contine con su plan de prdida de peso y deje de fumar. Consulte con nosotros si experimenta un empeoramiento de los sntomas o tiene alguna inquietud. De lo contrario, lo atenderemos en una cita de seguimiento dentro de 2 meses.

## 2024-08-16 NOTE — Progress Notes (Signed)
 Subjective:    Patient ID: Kelly Dillon, female    DOB: 1961/03/31, 63 y.o.   MRN: [id number]  Patient Care Team: Orlean Alan HERO, FNP as PCP - [facility])  Chief Complaint  Patient presents with   COPD    Cough, shortness of breath and wheezing.     BACKGROUND/INTERVAL:Kelly Dillon is a 63 year old current smoker who presents for follow-up of COPD and to establish care.   HPI Discussed the use of AI scribe software for clinical note transcription with the patient, who gave verbal consent to proceed.  History of Present Illness   Kelly Dillon is a 63 year old female with COPD who presents with shortness of breath.  She experiences shortness of breath, which she describes as variable, sometimes feeling 'low'. She notes that her weight affects her breathing, but she has lost 22 pounds recently, which she believes has been beneficial.  She is attempting to quit smoking and currently smokes one pack every three days. She uses oxygen at night, set at a level of three. Her oxygen equipment is supplied by [employer], previously known as [facility].  Her medication regimen includes Breztri  inhaler, with two inhalations twice a day, which she reports is effective. She also has an emergency inhaler, albuterol , but mentions that she was given prednisone  and antibiotics for emergencies instead.  She notes that her oxygen levels drop at night due to shallow breathing.  She cannot further elaborate on this.  She uses supplemental oxygen nocturnally and is compliant with the therapy.   Her overnight oximetry report from 01 August 2024 shows that she still qualifies for oxygen supplementation with saturations as low as 79%.  Patient is to continue oxygen supplementation.     DATA 10/26/2018 PFTs: FEV1 1.24 L, FVC 1.56 L, FEV1/FVC 79%, no significant bronchodilator response these were done at [facility] 11/28/2023 CT angio chest [facility]): Paraseptal emphysema, groundglass  interstitial scarring changes in the anterior upper lobes with underlying interstitial reticulation and mild traction bronchiectasis/bronchiolectasis.  Superimposed airspace consolidation in the anterior perihilar left upper lobe likely due to pneumonia.  Diffuse bronchial thickening.  Hiatal hernia. 12/04/2023 CT angio chest [facility]): Centrilobular emphysema, bilateral upper lobe predominant volume loss with traction bronchiectasis, persistent groundglass opacities.  This is reflective of fibrosis or sequela from prior infection.  4 mm calcified nodule along the inferior segment of the right upper lobe consistent with a granuloma.  No mediastinal adenopathy.  No PE. 05/11/2024 chest x-ray portable: Low lung volumes and mild basilar atelectasis, changes of COPD. 08/16/2024 PFTs: FEV1 1.14 L or 59% predicted, FVC of 1.37 L or 55% predicted, FEV1/FVC 83%, lung volumes mildly to moderately reduced.  This may be due to obesity (low ERV) fusion capacity mildly reduced but corrects to normal by alveolar volume.  Consistent with combined obstructive/restrictive physiology, obesity may be impacting and restriction.  Review of Systems A 10 point review of systems was performed and it is as noted above otherwise negative.   Patient Active Problem List   Diagnosis Date Noted   Nocturia 06/21/2024   Incontinence of feces 06/21/2024   Dyspareunia, female 06/21/2024   Bipolar 1 disorder (HCC) 05/12/2024   Neuropathy 05/12/2024   COPD exacerbation (HCC) 05/11/2024   Severe episode of recurrent major depressive disorder, without psychotic features (HCC) 05/10/2024   Suicidal ideation 05/10/2024   Homicidal thoughts 05/10/2024   COPD with acute exacerbation (HCC) 11/29/2023   Chronic respiratory failure with hypoxia (HCC) 11/28/2023   Essential  hypertension 05/26/2022   Hardening of the aorta (main artery of the heart) 05/26/2022   Major depressive disorder, single episode, severe with psychotic features (HCC)  05/26/2022   Hyperlipidemia 05/26/2022   Osteoarthritis of knee 05/26/2022   Sciatica 05/26/2022   Alcohol use disorder 02/08/2022   Diverticulitis 02/08/2022   Frailty 02/08/2022   Incisional hernia without obstruction or gangrene 02/08/2022   Long term (current) use of insulin  (HCC) 02/08/2022   Major depressive disorder, recurrent, moderate (HCC) 02/08/2022   Oxygen dependent 02/08/2022   Smoking trying to quit 02/08/2022   Dysphagia 02/08/2022   Immunodeficiency due to conditions classified elsewhere 02/08/2022   Hypokalemia 08/27/2021   Moderate persistent asthma, uncomplicated 08/27/2021   S/P repair of paraesophageal hernia 01/16/2020   Dysfunctional voiding of urine 08/21/2019   Scaly patch rash 08/21/2019   Restrictive lung disease 11/03/2018   Insomnia 10/13/2018   Dyslipidemia 10/13/2018   Other specified anxiety disorders 09/22/2018   Abdominal pain 08/17/2018   Ventral hernia, recurrent 08/15/2018   Chronic generalized abdominal pain (Primary Area of Pain) 02/21/2018   Chronic ankle pain, bilateral (Secondary Area of Pain) (L>R) 02/21/2018   Chronic pain of both knees [facility] Area of Pain) (L>R) 02/21/2018   Chronic bilateral low back pain with bilateral sciatica (Fourth Area of Pain) (L>R) 02/21/2018   Chronic neck pain(midline) 02/21/2018   Chronic left shoulder pain 02/21/2018   Chronic upper extremity pain (L>R) 02/21/2018   Chronic pain of both lower extremities (L>R) 02/21/2018   Chronic pain syndrome 02/21/2018   Long term current use of opiate analgesic 02/21/2018   Pharmacologic therapy 02/21/2018   Disorder of skeletal system 02/21/2018   Morbid obesity (HCC) 09/28/2017   Shortness of breath 08/28/2017   High risk medication use 06/15/2017   Multiple joint pain 01/24/2017   Hernia of abdominal wall 01/12/2017   Sacroiliitis 10/06/2016   Colon polyp 06/11/2016   Obesity, Class III, BMI 40-49.9 (morbid obesity) (HCC) 02/18/2016   Excessive daytime  sleepiness 11/11/2015   Avascular necrosis of bones of both hips (HCC) 04/20/2015   Mixed stress and urge urinary incontinence 12/09/2014   Uncontrolled type 2 diabetes mellitus with hyperglycemia, with long-term current use of insulin  (HCC) 05/01/2013   Chronic, continuous use of opioids 03/13/2013   DDD (degenerative disc disease), lumbar 03/13/2013   Suicide attempt (HCC) 03/13/2013   Recurrent ventral hernia 08/28/2012   Moderate COPD (chronic obstructive pulmonary disease) (HCC) 07/11/2012   Tobacco abuse 03/01/2012   Depression, major, recurrent, moderate (HCC) 07/21/2011   Diverticulosis 07/21/2011   Fibromyalgia 07/21/2011   GERD (gastroesophageal reflux disease) 07/21/2011    Social History   Tobacco Use   Smoking status: Every Day    Current packs/day: 0.50    Average packs/day: 2.0 packs/day for 50.8 years (99.9 ttl pk-yrs)    Types: Cigarettes    Start date: 1975   Smokeless tobacco: Never  Substance Use Topics   Alcohol use: Not Currently    Comment: gallon of liqour per day x 2 weeks    No Known Allergies  Current Meds  Medication Sig   albuterol  (PROVENTIL ) (2.5 MG/3ML) 0.083% nebulizer solution Inhale 3 mLs into the lungs every 6 (six) hours as needed.   albuterol  (VENTOLIN  HFA) 108 (90 Base) MCG/ACT inhaler Inhale 2 puffs into the lungs every 6 (six) hours as needed.   atorvastatin  (LIPITOR) 40 MG tablet Take 1 tablet (40 mg total) by mouth daily.   Blood Glucose Monitoring Suppl (ACCU-CHEK GUIDE ME) w/Device KIT  Use to check blood glucose three times daily before meals and at bedtime   budesonide -glycopyrrolate-formoterol  (BREZTRI  AEROSPHERE) 160-9-4.8 MCG/ACT AERO inhaler Inhale 2 puffs into the lungs in the morning and at bedtime.   COMFORT EZ PEN NEEDLES 32G X 4 MM MISC 1 Needle in the morning, at noon, in the evening, and at bedtime.   folic acid  (FOLVITE ) 1 MG tablet Take 1 tablet (1 mg total) by mouth daily.   glucose blood (ACCU-CHEK GUIDE) test strip  Use to check blood glucose before meals and at bedtime   Lancets (ONETOUCH ULTRASOFT) lancets Use to check blood sugars twice daily.   lithium  carbonate (LITHOBID ) 300 MG ER tablet Take 1 tablet (300 mg total) by mouth every 12 (twelve) hours.   losartan  (COZAAR ) 50 MG tablet Take 1 tablet (50 mg total) by mouth daily.   methocarbamol  (ROBAXIN ) 500 MG tablet Take 1 tablet (500 mg total) by mouth 3 (three) times daily.   montelukast  (SINGULAIR ) 10 MG tablet Take 1 tablet by mouth daily.   Multiple Vitamins-Minerals (CENTRUM SILVER 50+WOMEN) TABS Take 1 capsule by mouth See admin instructions.   nitroGLYCERIN  (NITROSTAT ) 0.4 MG SL tablet Place 1 tablet (0.4 mg total) under the tongue every 5 (five) minutes as needed for chest pain.   NOVOLOG  FLEXPEN 100 UNIT/ML FlexPen Inject 7 Units into the skin 3 (three) times daily with meals.   oxyCODONE -acetaminophen  (PERCOCET) 7.5-325 MG tablet Take 1 tablet by mouth every 6 (six) hours as needed for severe pain (pain score 7-10).   oxyCODONE -acetaminophen  (PERCOCET) 7.5-325 MG tablet Take 1 tablet by mouth every 6 (six) hours as needed for severe pain (pain score 7-10).   OXYGEN 3LPM   pregabalin  (LYRICA ) 200 MG capsule TAKE 1 CAPSULE BY MOUTH IN THE MORNING, AT NOON, AND AT BEDTIME.   traZODone  (DESYREL ) 100 MG tablet Take 200 mg by mouth at bedtime.   TRESIBA  FLEXTOUCH 100 UNIT/ML FlexTouch Pen Inject 2 Units into the skin at bedtime.   Trospium  Chloride 60 MG CP24 Take 1 capsule (60 mg total) by mouth in the morning and at bedtime.   venlafaxine  XR (EFFEXOR -XR) 150 MG 24 hr capsule Take 300 mg by mouth daily.   Vibegron  (GEMTESA ) 75 MG TABS Take 1 tablet (75 mg total) by mouth daily.   Vibegron  (GEMTESA ) 75 MG TABS TOME 1 TABLETA DIARIAMENTE.   [DISCONTINUED] budesonide -glycopyrrolate-formoterol  (BREZTRI  AEROSPHERE) 160-9-4.8 MCG/ACT AERO inhaler Inhale 2 puffs into the lungs in the morning and at bedtime.   [DISCONTINUED] Ipratropium-Albuterol   (COMBIVENT ) 20-100 MCG/ACT AERS respimat Inhale 1 puff into the lungs every 6 (six) hours as needed.   [DISCONTINUED] MOUNJARO 2.5 MG/0.5ML Pen SMARTSIG:1 auto-injector SUB-Q Once a Week    Immunization History  Administered Date(s) Administered   Influenza Split 09/01/2008, 08/19/2011, 08/15/2014, 09/12/2014   Influenza, Mdck, Trivalent,PF 6+ MOS(egg free) 09/25/2023   Influenza, Seasonal, Injecte, Preservative Fre 11/30/2011, 09/06/2012, 09/12/2014, 08/16/2024   Influenza,inj,Quad PF,6+ Mos 09/14/2016, 06/26/2017, 08/30/2017, 09/14/2018, 08/02/2019, 08/28/2021   Influenza-Unspecified 08/19/2011, 11/30/2011, 09/06/2012, 08/26/2015, 10/08/2021   PFIZER(Purple Top)SARS-COV-2 Vaccination 10/20/2020   PNEUMOCOCCAL CONJUGATE-20 12/01/2023   Pneumococcal Polysaccharide-23 06/24/2015, 03/31/2017   Tdap 03/10/2010        Objective:     BP 118/80   Pulse 81   Temp 97.6 F (36.4 C) (Temporal)   Ht 4' 9 (1.448 m)   Wt 166 lb (75.3 kg)   SpO2 97%   BMI 35.92 kg/m   SpO2: 97 %  GENERAL: Obese woman, no acute distress, ambulatory with  assistance of a walker.  No conversational dyspnea. HEAD: Normocephalic, atraumatic.  EYES: Pupils equal, round, reactive to light.  No scleral icterus.  MOUTH: Edentulous, oral mucosa moist.  No thrush. NECK: Supple. No thyromegaly. Trachea midline. No JVD.  No adenopathy. PULMONARY: Good air entry bilaterally.  Coarse, otherwise no adventitious. CARDIOVASCULAR: S1 and S2. Regular rate and rhythm.  No rubs, murmurs or gallops heard. ABDOMEN: Obese, otherwise benign. MUSCULOSKELETAL: No joint deformity, no clubbing, no edema.  NEUROLOGIC: No overt focal deficit, gait assisted by walker due to unsteadiness, speech is fluent. SKIN: Intact,warm,dry. PSYCH: Mood and behavior normal.        Assessment & Plan:     ICD-10-CM   1. Moderate COPD (chronic obstructive pulmonary disease) (HCC)  J44.9     2. 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 Dyspnea on exertion  R06.09     4. Immunization due  Z23 Flu vaccine trivalent PF, 6mos and older(Flulaval,Afluria,Fluarix,Fluzone)      Orders Placed This Encounter  Procedures   Flu vaccine trivalent PF, 6mos and older(Flulaval,Afluria,Fluarix,Fluzone)    Meds ordered this encounter  Medications   budesonide -glycopyrrolate-formoterol  (BREZTRI  AEROSPHERE) 160-9-4.8 MCG/ACT AERO inhaler    Sig: Inhale 2 puffs into the lungs in the morning and at bedtime.    Dispense:  10.7 g    Refill:  11   albuterol  (VENTOLIN  HFA) 108 (90 Base) MCG/ACT inhaler    Sig: Inhale 2 puffs into the lungs every 6 (six) hours as needed.    Dispense:  8 g    Refill:  2   budesonide -glycopyrrolate-formoterol  (BREZTRI  AEROSPHERE) 160-9-4.8 MCG/ACT AERO inhaler    Sig: Inhale 2 puffs into the lungs in the morning and at bedtime.    Dispense:  2 each    Lot Number?:   [id number]    Expiration Date?:   09/21/2026    Manufacturer?:   [employer] [[id number]]    NDC:   9689-5383-71 [[id number]]    Quantity:   2   Discussion:    Chronic obstructive pulmonary disease (COPD) with chronic bronchitis and nocturnal hypoxemia COPD with chronic bronchitis and nocturnal hypoxemia confirmed by pulmonary studies. Reports intermittent dyspnea. Oxygen desaturation during sleep due to shallow breathing, without sleep apnea. Currently using Breztri  inhaler effectively. Mild wheezing on examination. - Prescribe Breztri  inhaler, two inhalations twice daily - Provide samples of Breztri  - Prescribe nebulizer for emergency use - Continue nocturnal oxygen therapy at 2 L/min  Tobacco use Currently smoking one pack every three days. - Advise smoking cessation to improve respiratory health  Obesity Obesity contributing to difficulty in deep breathing. Has lost 22 pounds, benefiting respiratory function. - Encourage continued weight loss efforts     Smoking/Tobacco Cessation Counseling Kelly  Adelaida Dillon is a current user of tobacco or nicotine  products. She is considering quitting at this time. Counseling provided today addressed the risks of continued use and the benefits of cessation. Discussed tobacco/nicotine  use history, readiness to quit, and evidence-based treatment options including behavioral strategies, support resources, and pharmacologic therapies. Provided encouragement and educational materials on steps and resources to quit smoking. Patient questions were addressed, and follow-up recommended for continued support. Total time spent on counseling: 5 minutes.  .  Advised if symptoms do not improve or worsen, to please contact office for sooner follow up or seek emergency care.    I spent 31 minutes of dedicated to the care of this patient on the  date of this encounter to include pre-visit review of records, face-to-face time with the patient discussing conditions above, post visit ordering of testing, clinical documentation with the electronic health record, making appropriate referrals as documented, and communicating necessary findings to members of the patients care team.     C. Leita Sanders, MD [facility]  [facility]    *This note was generated using voice recognition software/Dragon and/or AI transcription program.  Despite best efforts to proofread, errors can occur which can change the meaning. Any transcriptional errors that result from this process are unintentional and may not be fully corrected at the time of dictation.

## 2024-08-19 ENCOUNTER — Ambulatory Visit: Admitting: Family

## 2024-08-20 ENCOUNTER — Ambulatory Visit: Admitting: Dietician

## 2024-08-21 ENCOUNTER — Ambulatory Visit: Admitting: Physical Therapy

## 2024-08-22 ENCOUNTER — Encounter

## 2024-08-26 ENCOUNTER — Ambulatory Visit: Admitting: Cardiovascular Disease

## 2024-08-27 ENCOUNTER — Ambulatory Visit: Admitting: Family

## 2024-08-27 VITALS — BP 122/74 | HR 96 | Ht <= 58 in | Wt 174.0 lb

## 2024-08-27 DIAGNOSIS — G894 Chronic pain syndrome: Secondary | ICD-10-CM

## 2024-08-27 DIAGNOSIS — F319 Bipolar disorder, unspecified: Secondary | ICD-10-CM | POA: Diagnosis not present

## 2024-08-27 DIAGNOSIS — E1165 Type 2 diabetes mellitus with hyperglycemia: Secondary | ICD-10-CM

## 2024-08-27 DIAGNOSIS — Z794 Long term (current) use of insulin: Secondary | ICD-10-CM

## 2024-08-27 DIAGNOSIS — Z0130 Encounter for examination of blood pressure without abnormal findings: Secondary | ICD-10-CM

## 2024-08-27 LAB — POCT CBG (FASTING - GLUCOSE)-MANUAL ENTRY: Glucose Fasting, POC: 129 mg/dL — AB (ref 70–99)

## 2024-08-27 NOTE — Progress Notes (Signed)
 Established Patient Office Visit  Subjective:  Patient ID: Kelly Dillon, female    DOB: 1961/02/24  Age: 63 y.o. MRN: [id number]  Chief Complaint  Patient presents with   Follow-up    2 month follow up    Patient is here today for her 3 months follow up.  She has been feeling fairly well since last appointment.   She does have additional concerns to discuss today.  Needs a referral to Psychiatry  Labs are not due today.  She needs refills.   I have reviewed her active problem list, medication list, allergies, notes from last encounter, lab results for her appointment today.      No other concerns at this time.   Past Medical History:  Diagnosis Date   Acute on chronic respiratory failure with hypoxia (HCC) 08/27/2021   Chronic pain syndrome    extensive - see problem list   Community acquired pneumonia of right lower lobe of lung 08/27/2021   COPD (chronic obstructive pulmonary disease) (HCC)    Diabetes mellitus without complication (HCC)    Fibromyalgia    went to [facility] for monthly lidocaine  infusions   Hypertension    Sepsis (HCC) 08/26/2021    Past Surgical History:  Procedure Laterality Date   APPENDECTOMY     COLONOSCOPY     COLONOSCOPY N/A 07/10/2024   Procedure: COLONOSCOPY;  Surgeon: Therisa Bi, MD;  Location: [facility] ENDOSCOPY;  Service: Gastroenterology;  Laterality: N/A;  IDDM   Spanish Interpreter needed   COLONOSCOPY WITH PROPOFOL  N/A 03/01/2022   Procedure: COLONOSCOPY WITH PROPOFOL ;  Surgeon: Therisa Bi, MD;  Location: [facility] ENDOSCOPY;  Service: Gastroenterology;  Laterality: N/A;   EPIDURAL BLOOD PATCH  03/20/2013   ESOPHAGEAL DILATION  09/20/2019   ESOPHAGOGASTRODUODENOSCOPY     ESOPHAGOGASTRODUODENOSCOPY N/A 03/01/2022   Procedure: ESOPHAGOGASTRODUODENOSCOPY (EGD);  Surgeon: Therisa Bi, MD;  Location: [facility] ENDOSCOPY;  Service: Gastroenterology;  Laterality: N/A;   HEMOSTASIS CLIP PLACEMENT  07/10/2024   Procedure: CONTROL OF  HEMORRHAGE, GI TRACT, ENDOSCOPIC, BY CLIPPING OR OVERSEWING;  Surgeon: Therisa Bi, MD;  Location: [facility] ENDOSCOPY;  Service: Gastroenterology;;   HERNIA REPAIR     OOPHORECTOMY     POLYPECTOMY  07/10/2024   Procedure: POLYPECTOMY, INTESTINE;  Surgeon: Therisa Bi, MD;  Location: [facility] ENDOSCOPY;  Service: Gastroenterology;;   TUBAL LIGATION     VENTRAL HERNIA REPAIR N/A 08/15/2018   Procedure: HERNIA REPAIR VENTRAL ADULT;  Surgeon: Tye Millet, DO;  Location: [facility] ORS;  Service: General;  Laterality: N/A;   VENTRAL HERNIA REPAIR N/A 08/18/2018   Procedure: LAPAROSCOPIC VENTRAL HERNIA;  Surgeon: Tye Millet, DO;  Location: [facility] ORS;  Service: General;  Laterality: N/A;    Social History   Socioeconomic History   Marital status: Divorced    Spouse name: Not on file   Number of children: Not on file   Years of education: Not on file   Highest education level: Not on file  Occupational History   Not on file  Tobacco Use   Smoking status: Every Day    Current packs/day: 0.50    Average packs/day: 2.0 packs/day for 50.8 years (99.9 ttl pk-yrs)    Types: Cigarettes    Start date: 1975   Smokeless tobacco: Never  Vaping Use   Vaping status: Never Used  Substance and Sexual Activity   Alcohol use: Not Currently    Comment: gallon of liqour per day x 2 weeks   Drug use: Yes    Comment: CHRONIC  PAIN ,ON PERCOCET   Sexual activity: Not Currently  Other Topics Concern   Not on file  Social History Narrative   Not on file   Social Drivers of Health   Financial Resource Strain: High Risk (06/24/2024)   Received from [facility]   Overall Financial Resource Strain (CARDIA)    Difficulty of Paying Living Expenses: Hard  Food Insecurity: Food Insecurity Present (06/24/2024)   Received from [facility]   Hunger Vital Sign    Within the past 12 months, you worried that your food would run out before you got the money to buy more.: Sometimes true     Within the past 12 months, the food you bought just didn't last and you didn't have money to get more.: Never true  Transportation Needs: No Transportation Needs (06/24/2024)   Received from [facility] - Transportation    In the past 12 months, has lack of transportation kept you from medical appointments or from getting medications?: No    Lack of Transportation (Non-Medical): No  Physical Activity: Insufficiently Active (08/21/2019)   Received from [facility]   Exercise Vital Sign    On average, how many days per week do you engage in moderate to strenuous exercise (like a brisk walk)?: 4 days    On average, how many minutes do you engage in exercise at this level?: 30 min  Stress: Stress Concern Present (08/21/2019)   Received from [facility] - Occupational Stress Questionnaire    Feeling of Stress : Very much  Social Connections: Unknown (08/21/2019)   Received from [facility]   Social Connection and Isolation Panel    In a typical week, how many times do you talk on the phone with family, friends, or neighbors?: Once a week    How often do you get together with friends or relatives?: Once a week    How often do you attend church or religious services?: More than 4 times per year    Active Member of Clubs or Organizations: Not on file    Attends Banker Meetings: Not on file    Marital Status: Not on file  Intimate Partner Violence: Not At Risk (05/15/2024)   Humiliation, Afraid, Rape, and Kick questionnaire    Fear of Current or Ex-Partner: No    Emotionally Abused: No    Physically Abused: No    Sexually Abused: No    No family history on file.  No Known Allergies  Review of Systems  All other systems reviewed and are negative.      Objective:   BP 122/74   Pulse 96   Ht 4' 9 (1.448 m)   Wt 174 lb (78.9 kg)   SpO2 94%   BMI  37.65 kg/m   Vitals:   08/27/24 1330  BP: 122/74  Pulse: 96  Height: 4' 9 (1.448 m)  Weight: 174 lb (78.9 kg)  SpO2: 94%  BMI (Calculated): 37.64    Physical Exam Vitals and nursing note reviewed.  Constitutional:      Appearance: Normal appearance. She is normal weight.  HENT:     Head: Normocephalic.  Eyes:     Extraocular Movements: Extraocular movements intact.     Conjunctiva/sclera: Conjunctivae normal.     Pupils: Pupils are equal, round, and reactive to light.  Cardiovascular:     Rate and Rhythm:  Normal rate.  Pulmonary:     Effort: Pulmonary effort is normal.  Neurological:     General: No focal deficit present.     Mental Status: She is alert and oriented to person, place, and time. Mental status is at baseline.  Psychiatric:        Mood and Affect: Mood normal.        Behavior: Behavior normal.        Thought Content: Thought content normal.      Results for orders placed or performed in visit on 08/27/24  POCT CBG (Fasting - Glucose)  Result Value Ref Range   Glucose Fasting, POC 129 (A) 70 - 99 mg/dL    Recent Results (from the past 2160 hours)  Lipid panel     Status: None   Collection Time: 06/17/24  3:29 PM  Result Value Ref Range   Cholesterol, Total 139 100 - 199 mg/dL   Triglycerides 84 0 - 149 mg/dL   HDL 50 >60 mg/dL   VLDL Cholesterol Cal 16 5 - 40 mg/dL   LDL Chol Calc (NIH) 73 0 - 99 mg/dL   Chol/HDL Ratio 2.8 0.0 - 4.4 ratio    Comment:                                   T. Chol/HDL Ratio                                             Men  Women                               1/2 Avg.Risk  3.4    3.3                                   Avg.Risk  5.0    4.4                                2X Avg.Risk  9.6    7.1                                3X Avg.Risk 23.4   11.0   VITAMIN D  25 Hydroxy (Vit-D Deficiency, Fractures)     Status: None   Collection Time: 06/17/24  3:29 PM  Result Value Ref Range   Vit D, 25-Hydroxy 33.9 30.0 - 100.0  ng/mL    Comment: Vitamin D  deficiency has been defined by the Institute of Medicine and an Endocrine Society practice guideline as a level of serum 25-OH vitamin D  less than 20 ng/mL (1,2). The Endocrine Society went on to further define vitamin D  insufficiency as a level between 21 and 29 ng/mL (2). 1. IOM (Institute of Medicine). 2010. Dietary reference    intakes for calcium  and D. [place]: The    [employer]. 2. Holick MF, Binkley [place], Bischoff-Ferrari HA, et al.    Evaluation, treatment, and prevention of vitamin D     deficiency: an Endocrine Society clinical practice    guideline. JCEM. 2011 Jul; 96(7):1911-30.  CMP14+EGFR     Status: Abnormal   Collection Time: 06/17/24  3:29 PM  Result Value Ref Range   Glucose 112 (H) 70 - 99 mg/dL   BUN 7 (L) 8 - 27 mg/dL   Creatinine, Ser 9.13 0.57 - 1.00 mg/dL   eGFR 77 >40 fO/fpw/8.26   BUN/Creatinine Ratio 8 (L) 12 - 28   Sodium 144 134 - 144 mmol/L   Potassium 3.7 3.5 - 5.2 mmol/L   Chloride 106 96 - 106 mmol/L   CO2 20 20 - 29 mmol/L   Calcium  9.5 8.7 - 10.3 mg/dL   Total Protein 6.8 6.0 - 8.5 g/dL   Albumin 4.2 3.9 - 4.9 g/dL   Globulin, Total 2.6 1.5 - 4.5 g/dL   Bilirubin Total 0.6 0.0 - 1.2 mg/dL   Alkaline Phosphatase 106 44 - 121 IU/L   AST 30 0 - 40 IU/L   ALT 15 0 - 32 IU/L  TSH     Status: None   Collection Time: 06/17/24  3:29 PM  Result Value Ref Range   TSH 1.440 0.450 - 4.500 uIU/mL  Hemoglobin A1c     Status: Abnormal   Collection Time: 06/17/24  3:29 PM  Result Value Ref Range   Hgb A1c MFr Bld 6.9 (H) 4.8 - 5.6 %    Comment:          Prediabetes: 5.7 - 6.4          Diabetes: >6.4          Glycemic control for adults with diabetes: <7.0    Est. average glucose Bld gHb Est-mCnc 151 mg/dL  Vitamin B12     Status: None   Collection Time: 06/17/24  3:29 PM  Result Value Ref Range   Vitamin B-12 788 232 - 1,245 pg/mL  CBC with Diff     Status: None   Collection Time: 06/17/24  3:29 PM   Result Value Ref Range   WBC 6.9 3.4 - 10.8 x10E3/uL   RBC 4.10 3.77 - 5.28 x10E6/uL   Hemoglobin 12.6 11.1 - 15.9 g/dL   Hematocrit 61.3 65.9 - 46.6 %   MCV 94 79 - 97 fL   MCH 30.7 26.6 - 33.0 pg   MCHC 32.6 31.5 - 35.7 g/dL   RDW 86.6 88.2 - 84.5 %   Platelets 217 150 - 450 x10E3/uL   Neutrophils 60 Not Estab. %   Lymphs 32 Not Estab. %   Monocytes 6 Not Estab. %   Eos 2 Not Estab. %   Basos 0 Not Estab. %   Neutrophils Absolute 4.2 1.4 - 7.0 x10E3/uL   Lymphocytes Absolute 2.2 0.7 - 3.1 x10E3/uL   Monocytes Absolute 0.4 0.1 - 0.9 x10E3/uL   EOS (ABSOLUTE) 0.1 0.0 - 0.4 x10E3/uL   Basophils Absolute 0.0 0.0 - 0.2 x10E3/uL   Immature Granulocytes 0 Not Estab. %   Immature Grans (Abs) 0.0 0.0 - 0.1 x10E3/uL  POCT URINALYSIS DIP (CLINITEK)     Status: None   Collection Time: 06/21/24  1:31 PM  Result Value Ref Range   Color, UA yellow yellow   Clarity, UA clear clear   Glucose, UA negative negative mg/dL   Bilirubin, UA negative negative   Ketones, POC UA negative negative mg/dL   Spec Grav, UA 8.984 8.989 - 1.025   Blood, UA negative negative   pH, UA 6.5 5.0 - 8.0   POC PROTEIN,UA negative negative, trace   Urobilinogen, UA 1.0 0.2 or 1.0  E.U./dL   Nitrite, UA Negative Negative   Leukocytes, UA Negative Negative  Surgical pathology     Status: None   Collection Time: 07/10/24 12:00 AM  Result Value Ref Range   SURGICAL PATHOLOGY      SURGICAL PATHOLOGY [facility] [address] [place], [place] [address] Telephone [phone] or [phone] Fax [phone]  REPORT OF SURGICAL PATHOLOGY   Accession #: [id number] Patient Name: MARCELENE, WEIDEMANN Visit # : [id number]  MRN: [id number] Physician: Therisa Bi DOB/Age [birth year] (Age: 31) Gender: F Collected Date: 07/10/2024 Received Date: 07/10/2024  FINAL DIAGNOSIS       1. Rectum, polyp(s), cold snare :       - HYPERPLASTIC POLYP(S)       DATE SIGNED OUT:  07/11/2024 ELECTRONIC SIGNATURE : Rebbecca Md, Nilesh, Pathologist, Electronic Signature  MICROSCOPIC DESCRIPTION  CASE COMMENTS STAINS USED IN DIAGNOSIS: H&E    CLINICAL HISTORY  SPECIMEN(S) OBTAINED 1. Rectum, polyp(s), Cold Snare  SPECIMEN COMMENTS: SPECIMEN CLINICAL INFORMATION: 1. Change in bowel habits, rectal polyp    Gross Description 1. Received in formalin are tan, soft tissue fragments that are submitted in toto.  Number:  three,   Size:  0.8 cm, 1 block. mb 07-10-24        Report signed out from the following location(s) [place].  [facility] [address] [place], [place] [address] CLIA #: [id number]  [facility] [address] [place], [place] [address] CLIA #: [id number]   Pulmonary function test     Status: None   Collection Time: 08/16/24  9:49 AM  Result Value Ref Range   FVC-Pre 1.37 L   FVC-%Pred-Pre 55 %   FVC-Post 1.39 L   FVC-%Pred-Post 56 %   FVC-%Change-Post 1 %   FEV1-Pre 1.14 L   FEV1-%Pred-Pre 59 %   FEV1-Post 1.16 L   FEV1-%Pred-Post 61 %   FEV1-%Change-Post 2 %   FEV6-Pre 1.37 L   FEV6-%Pred-Pre 57 %   FEV6-Post 1.39 L   FEV6-%Pred-Post 58 %   FEV6-%Change-Post 1 %   Pre FEV1/FVC ratio 83 %   FEV1FVC-%Pred-Pre 106 %   Post FEV1/FVC ratio 84 %   FEV1FVC-%Change-Post 1 %   Pre FEV6/FVC Ratio 100 %   FEV6FVC-%Pred-Pre 104 %   Post FEV6/FVC ratio 100 %   FEV6FVC-%Pred-Post 104 %   FEF 25-75 Pre 1.26 L/sec   FEF2575-%Pred-Pre 65 %   FEF 25-75 Post 1.41 L/sec   FEF2575-%Pred-Post 73 %   FEF2575-%Change-Post 11 %   RV 1.38 L   RV % pred 83 %   TLC 2.88 L   TLC % pred 71 %   DLCO unc 11.52 ml/min/mmHg   DLCO unc % pred 74 %   DL/VA 5.58 ml/min/mmHg/L   DL/VA % pred 99 %  POCT CBG (Fasting - Glucose)     Status: Abnormal   Collection Time: 08/27/24  1:42 PM  Result Value Ref Range   Glucose Fasting, POC 129 (A) 70 - 99 mg/dL       Assessment & Plan Uncontrolled type 2 diabetes mellitus  with hyperglycemia, with long-term current use of insulin  (HCC) Checking labs today. Will call pt. With results  Continue current diabetes POC, as patient has been well controlled on current regimen.  Will adjust meds if needed based on labs.   -CBC w/Diff -CMP w/eGFR -Hemoglobin A1C  Bipolar 1 disorder (HCC) Setting patient up for referral to psychiatry.  Will defer  to them for further treatment changes.  Reassess at follow up.  Chronic pain syndrome Setting patient up for referral to Pain management.  Will defer to them for further treatment changes.  Reassess at follow up.     Return in about 2 months (around 10/27/2024).   Total time spent: 20 minutes  ALAN CHRISTELLA ARRANT, FNP  08/27/2024   This document may have been prepared by [facility] Voice Recognition software and as such may include unintentional dictation errors.

## 2024-09-01 ENCOUNTER — Encounter: Payer: Self-pay | Admitting: Family

## 2024-09-01 NOTE — Assessment & Plan Note (Signed)
 Setting patient up for referral to psychiatry. .  Will defer to them for further treatment changes.  Reassess at follow up.

## 2024-09-01 NOTE — Assessment & Plan Note (Signed)
 Setting patient up for referral to Pain management.  Will defer to them for further treatment changes.  Reassess at follow up.

## 2024-09-01 NOTE — Assessment & Plan Note (Signed)
 Checking labs today. Will call pt. With results  Continue current diabetes POC, as patient has been well controlled on current regimen.  Will adjust meds if needed based on labs.   -CBC w/Diff -CMP w/eGFR -Hemoglobin A1C

## 2024-09-03 ENCOUNTER — Ambulatory Visit: Admitting: Family

## 2024-09-05 ENCOUNTER — Encounter: Payer: Self-pay | Admitting: Pulmonary Disease

## 2024-09-12 ENCOUNTER — Other Ambulatory Visit: Payer: Self-pay | Admitting: Family

## 2024-09-19 ENCOUNTER — Ambulatory Visit
Admission: RE | Admit: 2024-09-19 | Discharge: 2024-09-19 | Disposition: A | Discharge status: Home / Self Care | Source: Ambulatory Visit | Attending: Pulmonary Disease | Admitting: Pulmonary Disease

## 2024-09-19 ENCOUNTER — Ambulatory Visit (INDEPENDENT_AMBULATORY_CARE_PROVIDER_SITE_OTHER)

## 2024-09-19 DIAGNOSIS — R0600 Dyspnea, unspecified: Secondary | ICD-10-CM | POA: Insufficient documentation

## 2024-09-19 DIAGNOSIS — R55 Syncope and collapse: Secondary | ICD-10-CM

## 2024-09-19 DIAGNOSIS — I10 Essential (primary) hypertension: Secondary | ICD-10-CM

## 2024-09-19 DIAGNOSIS — R0789 Other chest pain: Secondary | ICD-10-CM

## 2024-09-19 DIAGNOSIS — E782 Mixed hyperlipidemia: Secondary | ICD-10-CM

## 2024-09-19 DIAGNOSIS — I483 Typical atrial flutter: Secondary | ICD-10-CM

## 2024-09-19 DIAGNOSIS — E1165 Type 2 diabetes mellitus with hyperglycemia: Secondary | ICD-10-CM

## 2024-09-19 DIAGNOSIS — Z720 Tobacco use: Secondary | ICD-10-CM

## 2024-09-19 DIAGNOSIS — R0609 Other forms of dyspnea: Secondary | ICD-10-CM

## 2024-09-19 MED ORDER — TECHNETIUM TC 99M SESTAMIBI GENERIC - CARDIOLITE
11.0000 | Freq: Once | INTRAVENOUS | Status: AC | PRN
Start: 2024-09-19 — End: 2024-09-19
  Administered 2024-09-19: 11 via INTRAVENOUS

## 2024-09-19 MED ORDER — TECHNETIUM TC 99M SESTAMIBI GENERIC - CARDIOLITE
30.4000 | Freq: Once | INTRAVENOUS | Status: AC | PRN
  Administered 2024-09-19: 30.4 via INTRAVENOUS

## 2024-09-20 ENCOUNTER — Encounter: Attending: Obstetrics | Admitting: Dietician

## 2024-09-20 ENCOUNTER — Encounter: Payer: Self-pay | Admitting: Dietician

## 2024-09-20 VITALS — Wt 167.0 lb

## 2024-09-20 DIAGNOSIS — E119 Type 2 diabetes mellitus without complications: Secondary | ICD-10-CM | POA: Diagnosis present

## 2024-09-20 LAB — ECHOCARDIOGRAM COMPLETE
AR max vel: 1.85 cm2
AV Area VTI: 1.93 cm2
AV Area mean vel: 1.73 cm2
AV Mean grad: 3 mmHg
AV Peak grad: 5.8 mmHg
Ao pk vel: 1.2 m/s
Area-P 1/2: 3.85 cm2
Calc EF: 58.1 %
MV VTI: 1.98 cm2
S' Lateral: 2.1 cm
Single Plane A2C EF: 55.8 %
Single Plane A4C EF: 62 %

## 2024-09-20 NOTE — Progress Notes (Signed)
 Diabetes Self-Management Education  Visit Type: First/Initial  Appt. Start Time: 0800 Appt. End Time: 1910  09/20/2024  [name], identified by name and date of birth, is a 63 y.o. female with a diagnosis of Diabetes: Type 2.   ASSESSMENT  History includes: type 2 diabetes, COPD, HTN, HLD Labs noted: 06/17/24: A1c 6.9% Medications include: mounjaro Supplements: MVI   [place] [facility] assisted in communication for this visit.   Pt reports she had A1c tested yesterday and it was 5.2%. Pt states she used to get low blood glucose but hasn't since last year. Pt reports she typically only checks blood glucose if symptoms arise. Pt reports she started mounjaro about one month ago.   Pt states she has made many changes over the years. Pt reports she was 250 lb and is now 167 lb. Pt reports she used to drink 12 cans of coke per day, and smoke 2 packs of cigarettes per day. Pt reports she now drinks 1 can of coke daily and smokes about 3 cigarettes. Pt reports she used to eat more candy but doesn't anymore.   Pt states she typically wakes around 6am. Pt reports her 4 grandkids are living with her (ages 80, 3, 30 and 68). Pt reports she may cook them breakfast but does not typically eat with them. Pt reports she does not eat until 2-4pm and typically eats 1 meal per day. Pt reports she enjoys cooking. Pt states she does not have good teeth so she can not eat raw vegetables.   Pt reports concerns with affordability of food and not receiving SNAP benefits in November. Discussed options and supplied a list of resources for food panties and community kitchens in [place].   Weight 167 lb (75.8 kg). Body mass index is 36.14 kg/m.   Diabetes Self-Management Education - 09/19/24 1825       Visit Information   Visit Type First/Initial      Initial Visit   Diabetes Type Type 2      Psychosocial Assessment   Patient Belief/Attitude about Diabetes Motivated to manage  diabetes    What is the hardest part about your diabetes right now, causing you the most concern, or is the most worrisome to you about your diabetes?   Making healty food and beverage choices    Self-care barriers None    Self-management support Doctor's office    Other persons present Patient    Patient Concerns Nutrition/Meal planning    Special Needs None    Preferred Learning Style No preference indicated    Learning Readiness Ready      Pre-Education Assessment   Patient understands the diabetes disease and treatment process. Needs Instruction    Patient understands incorporating nutritional management into lifestyle. Needs Instruction    Patient undertands incorporating physical activity into lifestyle. Needs Instruction    Patient understands using medications safely. Needs Instruction    Patient understands monitoring blood glucose, interpreting and using results Needs Instruction    Patient understands prevention, detection, and treatment of acute complications. Needs Instruction    Patient understands prevention, detection, and treatment of chronic complications. Needs Instruction    Patient understands how to develop strategies to address psychosocial issues. Needs Instruction    Patient understands how to develop strategies to promote health/change behavior. Needs Instruction      Complications   Last HgB A1C per patient/outside source 6.9 %    How often do you check your blood sugar? 3-4 times /  week      Dietary Intake   Breakfast none    Snack (morning) none    Lunch 2pm: rice with meat or chicken    Snack (afternoon) none    Dinner none    Snack (evening) none    Beverage(s) 1 can coke, 48 oz water      Activity / Exercise   Activity / Exercise Type ADL's      Patient Education   Disease Pathophysiology Explored patient's options for treatment of their diabetes;Factors that contribute to the development of diabetes    Healthy Eating Role of diet in the  treatment of diabetes and the relationship between the three main macronutrients and blood glucose level;Plate Method;Meal timing in regards to the patients' current diabetes medication.;Information on hints to eating out and maintain blood glucose control.;Meal options for control of blood glucose level and chronic complications.    Being Active Role of exercise on diabetes management, blood pressure control and cardiac health.;Helped patient identify appropriate exercises in relation to his/her diabetes, diabetes complications and other health issue.    Medications Reviewed patients medication for diabetes, action, purpose, timing of dose and side effects.    Monitoring Identified appropriate SMBG and/or A1C goals.    Acute complications Taught prevention, symptoms, and  treatment of hypoglycemia - the 15 rule.    Chronic complications Relationship between chronic complications and blood glucose control;Identified and discussed with patient  current chronic complications    Diabetes Stress and Support Identified and addressed patients feelings and concerns about diabetes;Worked with patient to identify barriers to care and solutions    Lifestyle and Health Coping Lifestyle issues that need to be addressed for better diabetes care      Individualized Goals (developed by patient)   Nutrition General guidelines for healthy choices and portions discussed    Physical Activity Exercise 3-5 times per week;30 minutes per day    Medications take my medication as prescribed    Monitoring  Test my blood glucose as discussed    Problem Solving Eating Pattern    Reducing Risk examine blood glucose patterns;do foot checks daily;treat hypoglycemia with 15 grams of carbs if blood glucose less than 70mg /dL    Health Coping Ask for help with psychological, social, or emotional issues      Post-Education Assessment   Patient understands the diabetes disease and treatment process. Comprehends key points    Patient  understands incorporating nutritional management into lifestyle. Comprehends key points    Patient undertands incorporating physical activity into lifestyle. Comprehends key points    Patient understands using medications safely. Comphrehends key points    Patient understands monitoring blood glucose, interpreting and using results Comprehends key points    Patient understands prevention, detection, and treatment of acute complications. Comprehends key points    Patient understands prevention, detection, and treatment of chronic complications. Comprehends key points    Patient understands how to develop strategies to address psychosocial issues. Comprehends key points    Patient understands how to develop strategies to promote health/change behavior. Comprehends key points      Outcomes   Expected Outcomes Demonstrated interest in learning. Expect positive outcomes    Future DMSE 4-6 wks    Program Status Not Completed          Individualized Plan for Diabetes Self-Management Training:   Learning Objective:  Patient will have a greater understanding of diabetes self-management. Patient education plan is to attend individual and/or group sessions per assessed needs and  concerns.   Plan:   Patient Instructions  Goals Established by Patient:   Goal 1: Eat at least 3 times a day, even if just a snack.   Goal 2: switch to a mini can of coke each day.   Objetivos establecidos por la paciente:  Objetivo 1: [place] al menos 3 veces al da, aunque sea solo un refrigerio.  Objetivo 2: Cambiar a una lata mini de Coca[employer]Cola al da.  Expected Outcomes:  Demonstrated interest in learning. Expect positive outcomes  Education material provided: My Plate and Snack sheet, Food Assistance Resources  If problems or questions, patient to contact team via:  Phone  Future DSME appointment: 4-6 wks

## 2024-09-20 NOTE — Patient Instructions (Addendum)
 Goals Established by Patient:   Goal 1: Eat at least 3 times a day, even if just a snack.   Goal 2: switch to a mini can of coke each day.   Objetivos establecidos por la paciente:  Objetivo 1: [place] al menos 3 veces al da, aunque sea solo un refrigerio.  Objetivo 2: Cambiar a una lata mini de Coca[employer]Cola al da.

## 2024-09-23 ENCOUNTER — Encounter: Payer: Self-pay | Admitting: Obstetrics

## 2024-09-23 ENCOUNTER — Ambulatory Visit (INDEPENDENT_AMBULATORY_CARE_PROVIDER_SITE_OTHER): Admitting: Obstetrics

## 2024-09-23 VITALS — BP 104/73 | HR 81

## 2024-09-23 DIAGNOSIS — N941 Unspecified dyspareunia: Secondary | ICD-10-CM

## 2024-09-23 DIAGNOSIS — R351 Nocturia: Secondary | ICD-10-CM | POA: Diagnosis not present

## 2024-09-23 DIAGNOSIS — Z720 Tobacco use: Secondary | ICD-10-CM | POA: Diagnosis not present

## 2024-09-23 DIAGNOSIS — N3946 Mixed incontinence: Secondary | ICD-10-CM

## 2024-09-23 DIAGNOSIS — R159 Full incontinence of feces: Secondary | ICD-10-CM

## 2024-09-23 MED ORDER — TROSPIUM CHLORIDE ER 60 MG PO CP24: 1.0000 | ORAL_CAPSULE | Freq: Two times a day (BID) | ORAL | 3 refills | Status: AC

## 2024-09-23 MED ORDER — GEMTESA 75 MG PO TABS: 1.0000 | ORAL_TABLET | Freq: Every day | ORAL | 3 refills | Status: AC

## 2024-09-23 NOTE — Patient Instructions (Addendum)
  Bien por tu nivel de the first american!  Contina tomando Trospium  diariamente.  - Se habl sobre el cuidado vulvar adecuado, la importancia de la compresa tibia, evitar el uso de compresas, usar ropa interior de algodn y, si es necesario, aplicar una pomada protectora.  - Se recomend el uso de supositorios de vitamina E y crema hidratante con Replens/Revaree.  Por favor, llama al [phone] para programar la primera cita disponible para fisioterapia del suelo plvico.

## 2024-09-23 NOTE — Assessment & Plan Note (Signed)
-   avoid fluid intake 3 hours before bedtime and continue fluid management  - trial of Trospium  and Gemtesa  at bedtime - elevated feet during the day or use compression socks to reduce lower extremity swelling - discussed association with cardiac and pulm disorders - due to snoring, pending sleep study to r/o sleep apnea

## 2024-09-23 NOTE — Assessment & Plan Note (Signed)
>>  ASSESSMENT AND PLAN FOR OVERACTIVE BLADDER WRITTEN ON 06/21/2024  7:22 PM BY GUADLUPE DULL T, MD  - 06/21/24 POCT UA negative, PVR 84mL - some relief with oxybutynin  alone - daytime symptoms resolved with Trospium  and Gemtesa , Rx to continue - We discussed the symptoms of overactive bladder (OAB), which include urinary urgency, urinary frequency, nocturia, with or without urge incontinence.  While we do not know the exact etiology of OAB, several treatment options exist. We discussed management including behavioral therapy (decreasing bladder irritants, urge suppression strategies, timed voids, bladder retraining), physical therapy, medication; for refractory cases posterior tibial nerve stimulation, sacral neuromodulation, and intravesical botulinum toxin injection.  For anticholinergic medications, we discussed the potential side effects of anticholinergics including dry eyes, dry mouth, constipation, cognitive impairment and urinary retention. For Beta-3 agonist medication, we discussed the potential side effect of elevated blood pressure which is more likely to occur in individuals with uncontrolled hypertension. - discussed association with BMI and glycemic control, last HbA1C 5.2 in 09/19/24 - tried pelvic floor PT in [place]  10 years ago, desires to resume with referral placed and encouraged pt to schedule appt - Rx trospium  and Gemtesa  sent to [facility]. Previously discussed [facility] - For treatment of stress urinary incontinence,  non-surgical options include expectant management, weight loss, physical therapy, as well as a pessary.  Surgical options include a midurethral sling, Burch urethropexy, and transurethral injection of a bulking agent. - encouraged to continue tobacco cessation due to coughing, COPD requiring O2

## 2024-09-23 NOTE — Assessment & Plan Note (Signed)
-   reduced from 1PPD to 2-3 cigarettes/day  - encouraged to continue cessation due to COPD with chronic hypoxic respiratory failure on O2 and exacerbation of cough which worsens leakage

## 2024-09-23 NOTE — Assessment & Plan Note (Signed)
-   fibromyalgia on opioids and chronic low back pain with sciatica - The origin of pelvic floor muscle spasm can be multifactorial, including primary, reactive to a different pain source, trauma, or even part of a centralized pain syndrome.Treatment options include pelvic floor physical therapy, local (vaginal) or oral  muscle relaxants, pelvic muscle trigger point injections or centrally acting pain medications.   - referral to pelvic floor PT and encouraged pt to arrange appointment - reassess at follow-up

## 2024-09-23 NOTE — Progress Notes (Signed)
 [facility] Return Visit  SUBJECTIVE  History of Present Illness: Kelly Dillon is a 63 y.o. female seen in follow-up for mixed urinary incontinence, nocturia, tobacco use, fecal incontinence, dyspareunia, and BMI. Plan at last visit was tobacco cessation, trial of Trospium  and Gemtesa , fluid management, referral to pelvic floor PT, and referral to medical weight management.   Spanish interpreter Kelly Dillon [phone] (disconnected), Kelly Dillon [phone] Kelly Dillon  Reports doing much better with Trospium  and Gemtesa , reports still having Gemtesa  samples Leaks 2x/days with urgency resolved, voids 4-5x/night Denies taking medication at bedtime Drinks fluids for pills around 10pm, sleeps within an hour.  Leaks with coughing/sneezing 5-6x/day reduced to 3x/week with tobacco use and emphysema Tobacco use 1 pack/day down to 3 cigarettes/day Pad use: 5-6 adult diapers/day down to 1 diaper at night and 4 pads/day for hygiene with droplets of urine Reports using barrier ointment due to vulvar irritation Drinks: 72oz water/day down to 64-80oz, 12-24oz of coke zero down to 12oz/day, intermittent 24oz gatorade  Tried self directed Kegel exercises, pelvic floor PT around 33yrs ago in [place]   Tried combination Oxybutynin  5mg  and Trospium  20mg  once a day without relief  Reports perineal pressure for a few months, reports 2x in the last week.  Ambulates with walker  Rare FI 2-3x in 15 years Underwent colonoscopy 07/10/24 with 12mm polyp resected with 2 clipos placed and 1 diverticula noted in rectum Last HbA1C 5.2 in 09/19/24 6/20-24/25 ED admission for SI and EtOH abuse, increased wheezing and work of breathing with dyspnea. Productive cough.   Past Medical History: Patient  has a past medical history of Acute on chronic respiratory failure with hypoxia (HCC) (08/27/2021), Chronic pain syndrome, Community acquired pneumonia of right lower lobe of lung (08/27/2021), COPD (chronic  obstructive pulmonary disease) (HCC), Diabetes mellitus without complication (HCC), Fibromyalgia, Hypertension, and Sepsis (HCC) (08/26/2021).   Past Surgical History: She  has a past surgical history that includes Hernia repair; Tubal ligation; Appendectomy; Ventral hernia repair (N/A, 08/15/2018); Ventral hernia repair (N/A, 08/18/2018); Colonoscopy; Esophagogastroduodenoscopy; Colonoscopy with propofol  (N/A, 03/01/2022); Esophagogastroduodenoscopy (N/A, 03/01/2022); Epidural blood patch (03/20/2013); Esophageal dilation (09/20/2019); Oophorectomy; Colonoscopy (N/A, 07/10/2024); Polypectomy (07/10/2024); and Hemostasis clip placement (07/10/2024).   Medications: She has a current medication list which includes the following prescription(s): albuterol , albuterol , aripiprazole , atorvastatin , accu-chek guide me, breztri  aerosphere, breztri  aerosphere, comfort ez pen needles, folic acid , accu-chek guide, onetouch ultrasoft, lithium  carbonate, losartan , methocarbamol , montelukast , mounjaro, centrum silver 50+women, nitroglycerin , novolog  flexpen, oxycodone -acetaminophen , oxycodone -acetaminophen , oxygen-helium, pregabalin , trazodone , tresiba  flextouch, venlafaxine  xr, trospium  chloride, and gemtesa .   Allergies: Patient has no known allergies.   Social History: Patient  reports that she has been smoking cigarettes. She started smoking about 50 years ago. She has a 99.9 pack-year smoking history. She has never used smokeless tobacco. She reports current alcohol use. She reports current drug use.     OBJECTIVE     Physical Exam: Vitals:   09/23/24 0932  BP: 104/73  Pulse: 81   Gen: No apparent distress, A&O x 3.  Detailed Urogynecologic Evaluation: Offered pelvic exam for evaluation of perineal pressure. Pt declined and attributed to muscle pain, desires to proceed with pelvic floor PT      ASSESSMENT AND PLAN    Kelly Dillon is a 63 y.o. with:  1. Tobacco abuse   2. Mixed stress and urge  urinary incontinence   3. Nocturia   4. Incontinence of feces, unspecified fecal incontinence type   5. Dyspareunia, female     Tobacco abuse  Assessment & Plan: - reduced from 1PPD to 2-3 cigarettes/day  - encouraged to continue cessation due to COPD with chronic hypoxic respiratory failure on O2 and exacerbation of cough which worsens leakage   Mixed stress and urge urinary incontinence Assessment & Plan:  >>ASSESSMENT AND PLAN FOR OVERACTIVE BLADDER WRITTEN ON 06/21/2024  7:22 PM BY GUADLUPE DULL T, MD  - 06/21/24 POCT UA negative, PVR 84mL - some relief with oxybutynin  alone - daytime symptoms resolved with Trospium  and Gemtesa , Rx to continue - We discussed the symptoms of overactive bladder (OAB), which include urinary urgency, urinary frequency, nocturia, with or without urge incontinence.  While we do not know the exact etiology of OAB, several treatment options exist. We discussed management including behavioral therapy (decreasing bladder irritants, urge suppression strategies, timed voids, bladder retraining), physical therapy, medication; for refractory cases posterior tibial nerve stimulation, sacral neuromodulation, and intravesical botulinum toxin injection.  For anticholinergic medications, we discussed the potential side effects of anticholinergics including dry eyes, dry mouth, constipation, cognitive impairment and urinary retention. For Beta-3 agonist medication, we discussed the potential side effect of elevated blood pressure which is more likely to occur in individuals with uncontrolled hypertension. - discussed association with BMI and glycemic control, last HbA1C 5.2 in 09/19/24 - tried pelvic floor PT in [place]  10 years ago, desires to resume with referral placed and encouraged pt to schedule appt - Rx trospium  and Gemtesa  sent to [facility]. Previously discussed [facility] - For treatment of stress urinary incontinence,  non-surgical options include expectant  management, weight loss, physical therapy, as well as a pessary.  Surgical options include a midurethral sling, Burch urethropexy, and transurethral injection of a bulking agent. - encouraged to continue tobacco cessation due to coughing, COPD requiring O2  Orders: -     AMB referral to rehabilitation -     Trospium  Chloride ER; Take 1 capsule (60 mg total) by mouth in the morning and at bedtime.  Dispense: 180 capsule; Refill: 3 -     Gemtesa ; Take 1 tablet (75 mg total) by mouth daily.  Dispense: 90 tablet; Refill: 3  Nocturia Assessment & Plan: - avoid fluid intake 3 hours before bedtime and continue fluid management  - trial of Trospium  and Gemtesa  at bedtime - elevated feet during the day or use compression socks to reduce lower extremity swelling - discussed association with cardiac and pulm disorders - due to snoring, pending sleep study to r/o sleep apnea   Orders: -     AMB referral to rehabilitation -     Trospium  Chloride ER; Take 1 capsule (60 mg total) by mouth in the morning and at bedtime.  Dispense: 180 capsule; Refill: 3  Incontinence of feces, unspecified fecal incontinence type -     AMB referral to rehabilitation  Dyspareunia, female Assessment & Plan: - fibromyalgia on opioids and chronic low back pain with sciatica - The origin of pelvic floor muscle spasm can be multifactorial, including primary, reactive to a different pain source, trauma, or even part of a centralized pain syndrome.Treatment options include pelvic floor physical therapy, local (vaginal) or oral  muscle relaxants, pelvic muscle trigger point injections or centrally acting pain medications.   - referral to pelvic floor PT and encouraged pt to arrange appointment - reassess at follow-up   Orders: -     AMB referral to rehabilitation  Time spent: I spent 27 minutes dedicated to the care of this patient on the date of this encounter to include  pre-visit review of records, face-to-face time with  the patient discussing mixed urinary incontinence, nocturia, tobacco use, fecal incontinence, dyspareunia, BMI, and post visit documentation and ordering medication/ testing.    Lianne ONEIDA Gillis, MD

## 2024-09-24 ENCOUNTER — Other Ambulatory Visit: Payer: Self-pay | Admitting: Family

## 2024-09-24 ENCOUNTER — Ambulatory Visit: Admitting: Family

## 2024-09-24 ENCOUNTER — Ambulatory Visit: Admitting: Cardiovascular Disease

## 2024-09-24 ENCOUNTER — Encounter: Payer: Self-pay | Admitting: Cardiovascular Disease

## 2024-09-24 VITALS — BP 115/79 | HR 94 | Ht <= 58 in | Wt 169.8 lb

## 2024-09-24 DIAGNOSIS — Z79899 Other long term (current) drug therapy: Secondary | ICD-10-CM

## 2024-09-24 DIAGNOSIS — I10 Essential (primary) hypertension: Secondary | ICD-10-CM

## 2024-09-24 DIAGNOSIS — Z131 Encounter for screening for diabetes mellitus: Secondary | ICD-10-CM

## 2024-09-24 DIAGNOSIS — I5033 Acute on chronic diastolic (congestive) heart failure: Secondary | ICD-10-CM

## 2024-09-24 DIAGNOSIS — E782 Mixed hyperlipidemia: Secondary | ICD-10-CM | POA: Diagnosis not present

## 2024-09-24 DIAGNOSIS — Z720 Tobacco use: Secondary | ICD-10-CM

## 2024-09-24 DIAGNOSIS — R0789 Other chest pain: Secondary | ICD-10-CM

## 2024-09-24 MED ORDER — OXYCODONE-ACETAMINOPHEN 7.5-325 MG PO TABS: 1.0000 | ORAL_TABLET | Freq: Four times a day (QID) | ORAL | 0 refills | Status: DC | PRN

## 2024-09-24 MED ORDER — METHOCARBAMOL 500 MG PO TABS: 500.0000 mg | ORAL_TABLET | Freq: Three times a day (TID) | ORAL | 0 refills | Status: AC

## 2024-09-24 MED ORDER — EMPAGLIFLOZIN 25 MG PO TABS: 25.0000 mg | ORAL_TABLET | Freq: Every day | ORAL | 3 refills | Status: AC

## 2024-09-24 NOTE — Progress Notes (Signed)
 Cardiology Office Note   Date:  09/24/2024   ID:  Kelly Dillon, Kelly Dillon 03-Feb-1961, MRN [id number]  PCP:  Orlean Alan HERO, FNP  Cardiologist:  Denyse Bathe, MD      History of Present Illness: Kelly Dillon is a 63 y.o. female who presents for  Chief Complaint  Patient presents with   Follow-up    Nst results    63 HF has chest pain relieved with nitroglycerin .      Past Medical History:  Diagnosis Date   Acute on chronic respiratory failure with hypoxia (HCC) 08/27/2021   Chronic pain syndrome    extensive - see problem list   Community acquired pneumonia of right lower lobe of lung 08/27/2021   COPD (chronic obstructive pulmonary disease) (HCC)    Diabetes mellitus without complication (HCC)    Fibromyalgia    went to [facility] for monthly lidocaine  infusions   Hypertension    Sepsis (HCC) 08/26/2021     Past Surgical History:  Procedure Laterality Date   APPENDECTOMY     COLONOSCOPY     COLONOSCOPY N/A 07/10/2024   Procedure: COLONOSCOPY;  Surgeon: Therisa Bi, MD;  Location: [facility] ENDOSCOPY;  Service: Gastroenterology;  Laterality: N/A;  IDDM   Spanish Interpreter needed   COLONOSCOPY WITH PROPOFOL  N/A 03/01/2022   Procedure: COLONOSCOPY WITH PROPOFOL ;  Surgeon: Therisa Bi, MD;  Location: [facility] ENDOSCOPY;  Service: Gastroenterology;  Laterality: N/A;   EPIDURAL BLOOD PATCH  03/20/2013   ESOPHAGEAL DILATION  09/20/2019   ESOPHAGOGASTRODUODENOSCOPY     ESOPHAGOGASTRODUODENOSCOPY N/A 03/01/2022   Procedure: ESOPHAGOGASTRODUODENOSCOPY (EGD);  Surgeon: Therisa Bi, MD;  Location: [facility] ENDOSCOPY;  Service: Gastroenterology;  Laterality: N/A;   HEMOSTASIS CLIP PLACEMENT  07/10/2024   Procedure: CONTROL OF HEMORRHAGE, GI TRACT, ENDOSCOPIC, BY CLIPPING OR OVERSEWING;  Surgeon: Therisa Bi, MD;  Location: [facility] ENDOSCOPY;  Service: Gastroenterology;;   HERNIA REPAIR     OOPHORECTOMY     POLYPECTOMY  07/10/2024   Procedure: POLYPECTOMY, INTESTINE;   Surgeon: Therisa Bi, MD;  Location: [facility] ENDOSCOPY;  Service: Gastroenterology;;   TUBAL LIGATION     VENTRAL HERNIA REPAIR N/A 08/15/2018   Procedure: HERNIA REPAIR VENTRAL ADULT;  Surgeon: Tye Millet, DO;  Location: [facility] ORS;  Service: General;  Laterality: N/A;   VENTRAL HERNIA REPAIR N/A 08/18/2018   Procedure: LAPAROSCOPIC VENTRAL HERNIA;  Surgeon: Tye Millet, DO;  Location: [facility] ORS;  Service: General;  Laterality: N/A;     Current Outpatient Medications  Medication Sig Dispense Refill   albuterol  (PROVENTIL ) (2.5 MG/3ML) 0.083% nebulizer solution Inhale 3 mLs into the lungs every 6 (six) hours as needed. 75 mL 12   albuterol  (VENTOLIN  HFA) 108 (90 Base) MCG/ACT inhaler Inhale 2 puffs into the lungs every 6 (six) hours as needed. 8 g 2   ARIPiprazole  (ABILIFY ) 10 MG tablet Take 1 tablet (10 mg total) by mouth 2 (two) times daily. 60 tablet 0   atorvastatin  (LIPITOR) 40 MG tablet Take 1 tablet (40 mg total) by mouth daily. 90 tablet 1   Blood Glucose Monitoring Suppl (ACCU-CHEK GUIDE ME) w/Device KIT Use to check blood glucose three times daily before meals and at bedtime 1 kit 0   budesonide -glycopyrrolate-formoterol  (BREZTRI  AEROSPHERE) 160-9-4.8 MCG/ACT AERO inhaler Inhale 2 puffs into the lungs in the morning and at bedtime. 10.7 g 11   budesonide -glycopyrrolate-formoterol  (BREZTRI  AEROSPHERE) 160-9-4.8 MCG/ACT AERO inhaler Inhale 2 puffs into the lungs in the morning and at bedtime. 2 each    COMFORT  EZ PEN NEEDLES 32G X 4 MM MISC 1 Needle in the morning, at noon, in the evening, and at bedtime.     empagliflozin (JARDIANCE) 25 MG TABS tablet Take 1 tablet (25 mg total) by mouth daily. 30 tablet 3   folic acid  (FOLVITE ) 1 MG tablet Take 1 tablet (1 mg total) by mouth daily. 30 tablet 0   glucose blood (ACCU-CHEK GUIDE) test strip Use to check blood glucose before meals and at bedtime 100 each 12   Lancets (ONETOUCH ULTRASOFT) lancets Use to check blood sugars twice daily. 200  each 2   lithium  carbonate (LITHOBID ) 300 MG ER tablet Take 1 tablet (300 mg total) by mouth every 12 (twelve) hours. 60 tablet 0   losartan  (COZAAR ) 50 MG tablet Take 1 tablet (50 mg total) by mouth daily. 90 tablet 1   methocarbamol  (ROBAXIN ) 500 MG tablet Take 1 tablet (500 mg total) by mouth 3 (three) times daily. 90 tablet 0   montelukast  (SINGULAIR ) 10 MG tablet Take 1 tablet by mouth daily.     MOUNJARO 5 MG/0.5ML Pen Inject 5 mg into the skin once a week.     Multiple Vitamins-Minerals (CENTRUM SILVER 50+WOMEN) TABS Take 1 capsule by mouth See admin instructions.     nitroGLYCERIN  (NITROSTAT ) 0.4 MG SL tablet Place 1 tablet (0.4 mg total) under the tongue every 5 (five) minutes as needed for chest pain. 100 tablet 3   NOVOLOG  FLEXPEN 100 UNIT/ML FlexPen Inject 7 Units into the skin 3 (three) times daily with meals.     oxyCODONE -acetaminophen  (PERCOCET) 7.5-325 MG tablet Take 1 tablet by mouth every 6 (six) hours as needed for severe pain (pain score 7-10). 30 tablet 0   oxyCODONE -acetaminophen  (PERCOCET) 7.5-325 MG tablet Take 1 tablet by mouth every 6 (six) hours as needed for severe pain (pain score 7-10). 30 tablet 0   OXYGEN 3LPM     pregabalin  (LYRICA ) 200 MG capsule TAKE 1 CAPSULE BY MOUTH IN THE MORNING, AT NOON, AND AT BEDTIME. 90 capsule 0   traZODone  (DESYREL ) 100 MG tablet Take 200 mg by mouth at bedtime.     TRESIBA  FLEXTOUCH 100 UNIT/ML FlexTouch Pen Inject 2 Units into the skin at bedtime.     Trospium  Chloride 60 MG CP24 Take 1 capsule (60 mg total) by mouth in the morning and at bedtime. 180 capsule 3   venlafaxine  XR (EFFEXOR -XR) 150 MG 24 hr capsule TAKE 2 CAPSULES BY MOUTH DAILY. 180 capsule 1   Vibegron  (GEMTESA ) 75 MG TABS Take 1 tablet (75 mg total) by mouth daily. 90 tablet 3   No current facility-administered medications for this visit.    Allergies:   Patient has no known allergies.    Social History:   reports that she has been smoking cigarettes. She  started smoking about 50 years ago. She has a 99.9 pack-year smoking history. She has never used smokeless tobacco. She reports current alcohol use. She reports current drug use.   Family History:  family history includes Lung cancer in her maternal uncle; Stomach cancer in her father.    ROS:     Review of Systems  Constitutional: Negative.   HENT: Negative.    Eyes: Negative.   Respiratory: Negative.    Gastrointestinal: Negative.   Genitourinary: Negative.   Musculoskeletal: Negative.   Skin: Negative.   Neurological: Negative.   Endo/Heme/Allergies: Negative.   Psychiatric/Behavioral: Negative.    All other systems reviewed and are negative.     All  other systems are reviewed and negative.    PHYSICAL EXAM: VS:  BP 115/79   Pulse 94   Ht 4' 9 (1.448 m)   Wt 169 lb 12.8 oz (77 kg)   SpO2 97%   BMI 36.74 kg/m  , BMI Body mass index is 36.74 kg/m. Last weight:  Wt Readings from Last 3 Encounters:  09/24/24 169 lb 12.8 oz (77 kg)  09/20/24 167 lb (75.8 kg)  08/27/24 174 lb (78.9 kg)     Physical Exam Constitutional:      Appearance: Normal appearance.  Cardiovascular:     Rate and Rhythm: Normal rate and regular rhythm.     Heart sounds: Normal heart sounds.  Pulmonary:     Effort: Pulmonary effort is normal.     Breath sounds: Normal breath sounds.  Musculoskeletal:     Right lower leg: No edema.     Left lower leg: No edema.  Neurological:     Mental Status: She is alert.       EKG:   Recent Labs: 05/10/2024: Magnesium  2.3 06/17/2024: ALT 15; BUN 7; Creatinine, Ser 0.86; Hemoglobin 12.6; Platelets 217; Potassium 3.7; Sodium 144; TSH 1.440    Lipid Panel    Component Value Date/Time   CHOL 139 06/17/2024 1529   TRIG 84 06/17/2024 1529   HDL 50 06/17/2024 1529   CHOLHDL 2.8 06/17/2024 1529   LDLCALC 73 06/17/2024 1529      Other studies Reviewed: Additional studies/ records that were reviewed today include:  Review of the above records  demonstrates:       No data to display            ASSESSMENT AND PLAN:    ICD-10-CM   1. Essential hypertension  I10 empagliflozin (JARDIANCE) 25 MG TABS tablet    2. Mixed hyperlipidemia  E78.2 empagliflozin (JARDIANCE) 25 MG TABS tablet    3. Tobacco abuse  Z72.0 empagliflozin (JARDIANCE) 25 MG TABS tablet    4. Other chest pain  R07.89 empagliflozin (JARDIANCE) 25 MG TABS tablet   Stress test normal.    5. CHF (congestive heart failure), NYHA class III, acute on chronic, diastolic (HCC)  I50.33 empagliflozin (JARDIANCE) 25 MG TABS tablet   ECHO had grade 1 diastolic dysfunction, add farxiga.       Problem List Items Addressed This Visit       Cardiovascular and Mediastinum   Essential hypertension - Primary   Relevant Medications   empagliflozin (JARDIANCE) 25 MG TABS tablet     Other   Tobacco abuse   Relevant Medications   empagliflozin (JARDIANCE) 25 MG TABS tablet   Hyperlipidemia   Relevant Medications   empagliflozin (JARDIANCE) 25 MG TABS tablet   Other Visit Diagnoses       Other chest pain       Stress test normal.   Relevant Medications   empagliflozin (JARDIANCE) 25 MG TABS tablet     CHF (congestive heart failure), NYHA class III, acute on chronic, diastolic (HCC)       ECHO had grade 1 diastolic dysfunction, add farxiga.   Relevant Medications   empagliflozin (JARDIANCE) 25 MG TABS tablet          Disposition:   Return in about 3 months (around 12/25/2024).    Total time spent: 30 minutes  Signed,  Denyse Bathe, MD  09/24/2024 10:29 AM    [facility]

## 2024-09-26 ENCOUNTER — Other Ambulatory Visit: Payer: Self-pay | Admitting: Internal Medicine

## 2024-09-26 LAB — TOXASSURE SELECT 13 (MW), URINE

## 2024-10-25 ENCOUNTER — Ambulatory Visit: Payer: Self-pay

## 2024-10-28 ENCOUNTER — Ambulatory Visit: Admitting: Family

## 2024-11-01 ENCOUNTER — Encounter: Admitting: Dietician

## 2024-11-01 ENCOUNTER — Ambulatory Visit: Admitting: Pulmonary Disease

## 2024-11-05 ENCOUNTER — Encounter: Admitting: Dietician

## 2024-11-05 ENCOUNTER — Encounter: Payer: Self-pay | Admitting: Dietician

## 2024-11-05 VITALS — Wt 163.6 lb

## 2024-11-05 DIAGNOSIS — E1165 Type 2 diabetes mellitus with hyperglycemia: Secondary | ICD-10-CM | POA: Diagnosis present

## 2024-11-05 DIAGNOSIS — Z794 Long term (current) use of insulin: Secondary | ICD-10-CM | POA: Insufficient documentation

## 2024-11-05 NOTE — Progress Notes (Signed)
 Diabetes Self-Management Education  Visit Type: Follow-up  Appt. Start Time: 1045 Appt. End Time: 1115  11/05/2024  Kelly Dillon, identified by name and date of birth, is a 63 y.o. female with a diagnosis of Diabetes: Type 2.   ASSESSMENT  History includes: type 2 diabetes, COPD, HTN, HLD Labs noted: 06/17/24: A1c 6.9% Medications include: mounjaro Supplements: MVI   Pt denied need for interpretor services.   Pt reports she went to [place]  for Thanksgiving to spend time with her son. Pt states she hopes to visit him again for Christmas.   Pt reports she has been trying to eat more consistently, even when not hungry. Pt reports she has been eating 3 times per day. Pt states her grandkids have been helping with some of the cooking.   Pt reports it has been difficult caring for her grandchildren. Pt reports financial stress.   Pt reports she has been trying to balance out meals and snacks.   Assessment of Previous Goals Established by Patient:    Goal 1: Eat at least 3 times a day, even if just a snack. - goal met, continue!   Goal 2: switch to a mini can of coke each day. - goal in progress, continue!  Weight 163 lb 9.6 oz (74.2 kg). Body mass index is 35.4 kg/m.   Diabetes Self-Management Education - 11/05/24 1046       Visit Information   Visit Type Follow-up      Initial Visit   Diabetes Type Type 2    Are you currently following a meal plan? No    Are you taking your medications as prescribed? Yes      Health Coping   How would you rate your overall health? Good      Psychosocial Assessment   Patient Belief/Attitude about Diabetes Motivated to manage diabetes    What is the hardest part about your diabetes right now, causing you the most concern, or is the most worrisome to you about your diabetes?   Making healty food and beverage choices    Self-care barriers None    Self-management support Doctor's office    Other persons present Patient    Patient  Concerns Nutrition/Meal planning    Special Needs None    Preferred Learning Style No preference indicated    Learning Readiness Ready      Pre-Education Assessment   Patient understands the diabetes disease and treatment process. Needs Review    Patient understands incorporating nutritional management into lifestyle. Needs Review    Patient undertands incorporating physical activity into lifestyle. Needs Review    Patient understands using medications safely. Needs Review    Patient understands monitoring blood glucose, interpreting and using results Needs Review    Patient understands prevention, detection, and treatment of acute complications. Needs Review    Patient understands prevention, detection, and treatment of chronic complications. Needs Review    Patient understands how to develop strategies to address psychosocial issues. Needs Review    Patient understands how to develop strategies to promote health/change behavior. Needs Review      Complications   Last HgB A1C per patient/outside source 6.9 %    How often do you check your blood sugar? 1-2 times/day      Dietary Intake   Breakfast pancake OR egg and bread OR sandwich    Snack (morning) none    Lunch cream of wheat    Snack (afternoon) none    Dinner rice and meat  Snack (evening) none    Beverage(s) 48-64 oz water      Activity / Exercise   Activity / Exercise Type ADL's      Patient Education   Previous Diabetes Education Yes    Disease Pathophysiology Explored patient's options for treatment of their diabetes    Healthy Eating Role of diet in the treatment of diabetes and the relationship between the three main macronutrients and blood glucose level;Plate Method;Meal timing in regards to the patients' current diabetes medication.;Meal options for control of blood glucose level and chronic complications.    Being Active Helped patient identify appropriate exercises in relation to his/her diabetes, diabetes  complications and other health issue.    Medications Reviewed patients medication for diabetes, action, purpose, timing of dose and side effects.    Chronic complications Relationship between chronic complications and blood glucose control;Identified and discussed with patient  current chronic complications    Diabetes Stress and Support Identified and addressed patients feelings and concerns about diabetes    Lifestyle and Health Coping Lifestyle issues that need to be addressed for better diabetes care      Individualized Goals (developed by patient)   Nutrition General guidelines for healthy choices and portions discussed    Physical Activity Exercise 3-5 times per week;15 minutes per day    Medications take my medication as prescribed    Monitoring  Test my blood glucose as discussed    Problem Solving Eating Pattern    Reducing Risk examine blood glucose patterns;do foot checks daily;treat hypoglycemia with 15 grams of carbs if blood glucose less than 70mg /dL    Health Coping Ask for help with psychological, social, or emotional issues      Patient Self-Evaluation of Goals - Patient rates self as meeting previously set goals (% of time)   Nutrition 50 - 75 % (half of the time)    Physical Activity 50 - 75 % (half of the time)    Medications >75% (most of the time)    Monitoring 50 - 75 % (half of the time)    Problem Solving and behavior change strategies  50 - 75 % (half of the time)    Reducing Risk (treating acute and chronic complications) 50 - 75 % (half of the time)    Health Coping 50 - 75 % (half of the time)      Post-Education Assessment   Patient understands the diabetes disease and treatment process. Comprehends key points    Patient understands incorporating nutritional management into lifestyle. Comprehends key points    Patient undertands incorporating physical activity into lifestyle. Comprehends key points    Patient understands using medications safely. Comphrehends  key points    Patient understands monitoring blood glucose, interpreting and using results Comprehends key points    Patient understands prevention, detection, and treatment of acute complications. Comprehends key points    Patient understands prevention, detection, and treatment of chronic complications. Comprehends key points    Patient understands how to develop strategies to address psychosocial issues. Comprehends key points    Patient understands how to develop strategies to promote health/change behavior. Comprehends key points      Outcomes   Expected Outcomes Demonstrated interest in learning. Expect positive outcomes    Future DMSE 2 months    Program Status Not Completed      Subsequent Visit   Since your last visit have you continued or begun to take your medications as prescribed? Yes    Since your  last visit have you experienced any weight changes? Loss    Weight Loss (lbs) 3          Individualized Plan for Diabetes Self-Management Training:   Learning Objective:  Patient will have a greater understanding of diabetes self-management. Patient education plan is to attend individual and/or group sessions per assessed needs and concerns.   Plan:   Patient Instructions  Continue with previous goals.   Aim to include protein, starch, and veggies at meals when possible.  Resources discussed:   [employer]- free winter coats [employer]- [employer] and resources Saturdays at [employer]: [address] [place]  Expected Outcomes:  Demonstrated interest in learning. Expect positive outcomes  Education material provided: none on this follow up  If problems or questions, patient to contact team via:  Phone  Future DSME appointment: 2 months

## 2024-11-05 NOTE — Patient Instructions (Signed)
 Continue with previous goals.   Aim to include protein, starch, and veggies at meals when possible.  Resources discussed:   [employer]- free winter coats [employer]- [employer] and resources Saturdays at [employer]: [address] [place]

## 2024-11-18 ENCOUNTER — Other Ambulatory Visit: Payer: Self-pay

## 2024-11-24 NOTE — Progress Notes (Deleted)
 " Psychiatric Initial Adult Assessment  Patient Identification: Kelly Dillon MRN:  [id number] Date of Evaluation:  11/24/2024 Referral Source:  Orlean Alan HERO, FNP  Assessment:  Kelly Dillon is a 64 y.o. female with a history of ***bipolar I disorder who presents to [facility] to establish care for psychiatric medication management.  Patient reports ***  The patient's presentation is most consistent with a principal diagnosis of ***, as evidenced by ***.   Relevant Chart Review Notes/Encounters: *** Labs/Imaging: ***   Risk Assessment: A suicide and violence risk assessment was performed as part of this evaluation. There patient is deemed to be at chronic elevated risk for self-harm/suicide given the following factors: {SABSUICIDERISKFACTORS:29780}. These risk factors are mitigated by the following factors: {SABSUICIDEPROTECTIVEFACTORS:29779}. The patient is deemed to be at chronic elevated risk for violence given the following factors: {SABVIOLENCERISKFACTORS:29781}. These risk factors are mitigated by the following factors: {SABVIOLENCEPROTECTIVEFACTORS:29782}. There is no *** acute risk for suicide or violence at this time. The patient was educated about relevant modifiable risk factors including following recommendations for treatment of psychiatric illness and abstaining from substance abuse.  While future psychiatric events cannot be accurately predicted, the patient does not *** currently require  acute inpatient psychiatric care and does not *** currently meet [place]  involuntary commitment criteria.    Plan:  # *** Past medication trials:  Status of problem: new to me Interventions: -- ***  # *** Past medication trials:  Status of problem: new to me Interventions: -- ***  # *** Past medication trials:  Status of problem: new to me Interventions: -- ***  Health Maintenance PCP: Orlean Alan HERO, FNP @ ***   Patient was  given contact information for behavioral health clinic and was instructed to call 911 for emergencies.  Patient and plan of care will be discussed with the Attending MD , who agrees with the above statement and plan.   Subjective:  Chief Complaint: No chief complaint on file.   History of Present Illness:   *** The patient is here for ***. The patient reports a *** history of ***.  The patient's stressors include ***.   Sleep: *** Snoring: *** Caffeine: ***   Psychiatric ROS Depression: *** The patient denied any current symptoms of depression/***The patient reports a *** history of low mood and anhedonia, characterized by {Depression:32915}. Suicidal thoughts are ***. The patient denies any symptoms of ***.   Anxiety: *** The patient denied any current symptoms of anxiety/ ***The patient reports a *** history of excessive anxiety and worry about a number of events, characterized by {HJI:67083}.   Panic attacks: *** The patient denies any current or past history of panic attacks/*** The patient reports experiencing of intense surge of fear, in which the following symptoms develop: {Panic Attacks:32917}  Mania/Hypomania: *** Negative for any past or current symptoms of expansive energy or mood/ The patient reports a *** history of episodes of expansive energy and mood that can last up to *** days characterized by {MANIA:32922}    Last episode reported by patient: ***   PTSD: *** Negative for any history of trauma / ***The patient reports past exposure to ***.  Intrusions s/xs: ***  Hyperarousal s/xs: ***  Avoidance s/xs: ***  Negative effects on cognition and mood: ***  Eating Disorder: The patient denies any current or past history of disordered eating that includes restrictive, binging, purging behaviors, or any other compensatory behaviors / ***   IED: ***/ The patient reports a **  history of impulsive aggression characterized by {PZI:67078}  Psychosis: *** The patient denies  any history of hallucinations, disorganized thinking, paranoia, or delusions. / ***    Safety: Active SI: ***Denied Passive SI: *** Access to firearms: *** Psychosis: as above  ROS   Past Psychiatric History:  Diagnoses: *** Medication trials: *** Previous psychiatrist/therapist: *** Hospitalizations: *** Suicide attempts: *** NSSIB Hx: *** Hx of violence towards others: *** Hx of trauma/abuse: ***  Substance Abuse History: Alcohol:  Hx of withdrawal seizures/Dts: *** Tobacco: *** Cannabis: *** Other Illicit Substance Use: IVDU: denied Detox Hx: *** Rehab Hx: ***  Past Medical History: PCP: Orlean Alan HERO, FNP  Dx: *** ALL: *** Patient has no known allergies.  Head trauma: *** Seizures: ***   Family Psychiatric History: *** Psychiatric disorders: *** Suicide hx: *** Homicide: ***  Social History: Living situation: *** Occupational status: *** Educational history: *** Marital Status: *** Children: *** Legal Hx: *** DUI/DWI: *** Military Hx: *** Developmental Hx: School Performance: Upbringing/Relationship with parents: Major Family stressors:  Past Medical History:  Past Medical History:  Diagnosis Date   Acute on chronic respiratory failure with hypoxia (HCC) 08/27/2021   Chronic pain syndrome    extensive - see problem list   Community acquired pneumonia of right lower lobe of lung 08/27/2021   COPD (chronic obstructive pulmonary disease) (HCC)    Diabetes mellitus without complication (HCC)    Fibromyalgia    went to [facility] clinic for monthly lidocaine  infusions   Hypertension    Sepsis (HCC) 08/26/2021    Past Surgical History:  Procedure Laterality Date   APPENDECTOMY     COLONOSCOPY     COLONOSCOPY N/A 07/10/2024   Procedure: COLONOSCOPY;  Surgeon: Therisa Bi, MD;  Location: [facility] ENDOSCOPY;  Service: Gastroenterology;  Laterality: N/A;  IDDM   Spanish Interpreter needed   COLONOSCOPY WITH PROPOFOL  N/A 03/01/2022   Procedure:  COLONOSCOPY WITH PROPOFOL ;  Surgeon: Therisa Bi, MD;  Location[facility] ENDOSCOPY;  Service: Gastroenterology;  Laterality: N/A;   EPIDURAL BLOOD PATCH  03/20/2013   ESOPHAGEAL DILATION  09/20/2019   ESOPHAGOGASTRODUODENOSCOPY     ESOPHAGOGASTRODUODENOSCOPY N/A 03/01/2022   Procedure: ESOPHAGOGASTRODUODENOSCOPY (EGD);  Surgeon: Therisa Bi, MD;  Location: [facility] ENDOSCOPY;  Service: Gastroenterology;  Laterality: N/A;   HEMOSTASIS CLIP PLACEMENT  07/10/2024   Procedure: CONTROL OF HEMORRHAGE, GI TRACT, ENDOSCOPIC, BY CLIPPING OR OVERSEWING;  Surgeon: Therisa Bi, MD;  Location: [facility] ENDOSCOPY;  Service: Gastroenterology;;   HERNIA REPAIR     OOPHORECTOMY     POLYPECTOMY  07/10/2024   Procedure: POLYPECTOMY, INTESTINE;  Surgeon: Therisa Bi, MD;  Location: [facility] ENDOSCOPY;  Service: Gastroenterology;;   TUBAL LIGATION     VENTRAL HERNIA REPAIR N/A 08/15/2018   Procedure: HERNIA REPAIR VENTRAL ADULT;  Surgeon: Tye Millet, DO;  Location: [facility] ORS;  Service: General;  Laterality: N/A;   VENTRAL HERNIA REPAIR N/A 08/18/2018   Procedure: LAPAROSCOPIC VENTRAL HERNIA;  Surgeon: Tye Millet, DO;  Location: [facility] ORS;  Service: General;  Laterality: N/A;    Family History:  Family History  Problem Relation Age of Onset   Stomach cancer Father    Lung cancer Maternal Uncle    Bladder Cancer Neg Hx    Renal cancer Neg Hx    Uterine cancer Neg Hx     Social History:   Social History   Socioeconomic History   Marital status: Divorced    Spouse name: Not on file   Number of children: Not on file  Years of education: Not on file   Highest education level: Not on file  Occupational History   Not on file  Tobacco Use   Smoking status: Every Day    Current packs/day: 0.50    Average packs/day: 2.0 packs/day for 51.0 years (100.0 ttl pk-yrs)    Types: Cigarettes    Start date: 72   Smokeless tobacco: Never  Vaping Use   Vaping status: Never Used  Substance and Sexual Activity   Alcohol  use: Yes    Comment: 09-23-24 drinks occ / gallon of liqour per day x 2 weeks   Drug use: Yes    Comment: CHRONIC PAIN ,ON PERCOCET   Sexual activity: Not Currently  Other Topics Concern   Not on file  Social History Narrative   Not on file   Social Drivers of Health   Tobacco Use: High Risk (11/05/2024)   Patient History    Smoking Tobacco Use: Every Day    Smokeless Tobacco Use: Never    Passive Exposure: Not on file  Financial Resource Strain: High Risk (06/24/2024)   Received from [facility]   Overall Financial Resource Strain (CARDIA)    Difficulty of Paying Living Expenses: Hard  Food Insecurity: Food Insecurity Present (06/24/2024)   Received from [facility]   Epic    Within the past 12 months, you worried that your food would run out before you got the money to buy more.: Sometimes true    Within the past 12 months, the food you bought just didn't last and you didn't have money to get more.: Never true  Transportation Needs: No Transportation Needs (06/24/2024)   Received from [facility] - Transportation    In the past 12 months, has lack of transportation kept you from medical appointments or from getting medications?: No    Lack of Transportation (Non-Medical): No  Physical Activity: Not on file  Stress: Not on file  Social Connections: Not on file  Depression (PHQ2-9): Low Risk (06/12/2024)   Depression (PHQ2-9)    PHQ-2 Score: 3  Alcohol Screen: Not on file  Housing: High Risk (06/24/2024)   Received from [facility]   Epic    In the last 12 months, was there a time when you were not able to pay the mortgage or rent on time?: Yes    In the past 12 months, how many times have you moved where you were living?: 0    At any time in the past 12 months, were you homeless or living in a shelter (including now)?: No  Utilities: At Risk (06/24/2024)   Received from [facility]    Epic    In the past 12 months has the electric, gas, oil, or water company threatened to shut off services in your home?: Yes  Health Literacy: Not on file    Allergies:  Allergies[1]  Current Medications: Current Outpatient Medications  Medication Sig Dispense Refill   albuterol  (PROVENTIL ) (2.5 MG/3ML) 0.083% nebulizer solution Inhale 3 mLs into the lungs every 6 (six) hours as needed. 75 mL 12   albuterol  (VENTOLIN  HFA) 108 (90 Base) MCG/ACT inhaler Inhale 2 puffs into the lungs every 6 (six) hours as needed. 8 g 2   ARIPiprazole  (ABILIFY ) 10 MG tablet Take 1 tablet (10 mg total) by mouth 2 (two) times daily. 60 tablet 0   atorvastatin  (LIPITOR) 40 MG tablet Take 1 tablet (40 mg total)  by mouth daily. 90 tablet 1   Blood Glucose Monitoring Suppl (ACCU-CHEK GUIDE ME) w/Device KIT Use to check blood glucose three times daily before meals and at bedtime 1 kit 0   budesonide -glycopyrrolate-formoterol  (BREZTRI  AEROSPHERE) 160-9-4.8 MCG/ACT AERO inhaler Inhale 2 puffs into the lungs in the morning and at bedtime. 10.7 g 11   budesonide -glycopyrrolate-formoterol  (BREZTRI  AEROSPHERE) 160-9-4.8 MCG/ACT AERO inhaler Inhale 2 puffs into the lungs in the morning and at bedtime. 2 each    COMFORT EZ PEN NEEDLES 32G X 4 MM MISC 1 Needle in the morning, at noon, in the evening, and at bedtime.     empagliflozin  (JARDIANCE ) 25 MG TABS tablet Take 1 tablet (25 mg total) by mouth daily. 30 tablet 3   folic acid  (FOLVITE ) 1 MG tablet Take 1 tablet (1 mg total) by mouth daily. 30 tablet 0   glucose blood (ACCU-CHEK GUIDE) test strip Use to check blood glucose before meals and at bedtime 100 each 12   Lancets (ONETOUCH ULTRASOFT) lancets Use to check blood sugars twice daily. 200 each 2   lithium  carbonate (LITHOBID ) 300 MG ER tablet Take 1 tablet (300 mg total) by mouth every 12 (twelve) hours. 60 tablet 0   losartan  (COZAAR ) 50 MG tablet Take 1 tablet (50 mg total) by mouth daily. 90 tablet 1   methocarbamol   (ROBAXIN ) 500 MG tablet Take 1 tablet (500 mg total) by mouth 3 (three) times daily. 90 tablet 0   montelukast  (SINGULAIR ) 10 MG tablet Take 1 tablet by mouth daily.     MOUNJARO 5 MG/0.5ML Pen Inject 5 mg into the skin once a week.     Multiple Vitamins-Minerals (CENTRUM SILVER 50+WOMEN) TABS Take 1 capsule by mouth See admin instructions.     nitroGLYCERIN  (NITROSTAT ) 0.4 MG SL tablet Place 1 tablet (0.4 mg total) under the tongue every 5 (five) minutes as needed for chest pain. 100 tablet 3   NOVOLOG  FLEXPEN 100 UNIT/ML FlexPen Inject 7 Units into the skin 3 (three) times daily with meals.     oxyCODONE -acetaminophen  (PERCOCET) 7.5-325 MG tablet Take 1 tablet by mouth every 6 (six) hours as needed for severe pain (pain score 7-10). 30 tablet 0   OXYGEN 3LPM     pregabalin  (LYRICA ) 200 MG capsule TAKE 1 CAPSULE BY MOUTH IN THE MORNING, AT NOON, AND AT BEDTIME. 90 capsule 0   traZODone  (DESYREL ) 100 MG tablet Take 200 mg by mouth at bedtime.     TRESIBA  FLEXTOUCH 100 UNIT/ML FlexTouch Pen Inject 2 Units into the skin at bedtime.     Trospium  Chloride 60 MG CP24 Take 1 capsule (60 mg total) by mouth in the morning and at bedtime. 180 capsule 3   venlafaxine  XR (EFFEXOR -XR) 150 MG 24 hr capsule TAKE 2 CAPSULES BY MOUTH DAILY. 180 capsule 1   Vibegron  (GEMTESA ) 75 MG TABS Take 1 tablet (75 mg total) by mouth daily. 90 tablet 3   No current facility-administered medications for this visit.     Objective: Psychiatric Specialty Exam: General Appearance: Casual, fairly groomed  Eye Contact:  Good    Speech:  Clear, coherent, normal rate, spontaneous  Volume:  Normal   Mood:  see above  Affect:  Appropriate, congruent, full range  Thought Content: Logical, rumination  ***  Suicidal Thoughts: see subjective  Thought Process:  Coherent, goal-directed, circumstantial ***  Orientation:  A&Ox4   Memory:  Immediate good  Judgment:  Fair   Insight:  Fair***  Concentration:  Attention and  concentration good   Recall:  Good  Fund of Knowledge: Good  Language: Good, fluent  Psychomotor Activity: Normal  Akathisia:  NA   AIMS (if indicated): NA   Assets:   {Assets (PAA):22698}  ADL's:  Intact  Cognition: WNL  Sleep: see above  Appetite: see above    Physical Exam    Metabolic Disorder Labs: Lab Results  Component Value Date   HGBA1C 6.9 (H) 06/17/2024   MPG 125.5 05/11/2024   MPG 123 08/27/2021   No results found for: PROLACTIN Lab Results  Component Value Date   CHOL 139 06/17/2024   TRIG 84 06/17/2024   HDL 50 06/17/2024   CHOLHDL 2.8 06/17/2024   LDLCALC 73 06/17/2024   LDLCALC 73 09/11/2023   Lab Results  Component Value Date   TSH 1.440 06/17/2024    Therapeutic Level Labs: No results found for: LITHIUM  No results found for: CBMZ No results found for: VALPROATE   Marlo Masson, MD 1/4/202611:15 AM     [1] No Known Allergies  "

## 2024-11-25 ENCOUNTER — Ambulatory Visit (HOSPITAL_COMMUNITY): Admitting: Student in an Organized Health Care Education/Training Program

## 2024-11-28 ENCOUNTER — Ambulatory Visit: Payer: Self-pay | Admitting: Certified Registered"

## 2024-11-28 ENCOUNTER — Encounter
Admission: RE | Disposition: A | Discharge status: Home / Self Care | Payer: Self-pay | Source: Home / Self Care | Attending: Gastroenterology

## 2024-11-28 ENCOUNTER — Encounter: Payer: Self-pay | Admitting: Gastroenterology

## 2024-11-28 ENCOUNTER — Ambulatory Visit
Admission: RE | Admit: 2024-11-28 | Discharge: 2024-11-28 | Disposition: A | Discharge status: Home / Self Care | Attending: Gastroenterology | Admitting: Gastroenterology

## 2024-11-28 DIAGNOSIS — E669 Obesity, unspecified: Secondary | ICD-10-CM | POA: Insufficient documentation

## 2024-11-28 DIAGNOSIS — Z794 Long term (current) use of insulin: Secondary | ICD-10-CM | POA: Insufficient documentation

## 2024-11-28 DIAGNOSIS — F419 Anxiety disorder, unspecified: Secondary | ICD-10-CM | POA: Diagnosis not present

## 2024-11-28 DIAGNOSIS — G894 Chronic pain syndrome: Secondary | ICD-10-CM | POA: Insufficient documentation

## 2024-11-28 DIAGNOSIS — I10 Essential (primary) hypertension: Secondary | ICD-10-CM | POA: Insufficient documentation

## 2024-11-28 DIAGNOSIS — Z6832 Body mass index (BMI) 32.0-32.9, adult: Secondary | ICD-10-CM | POA: Insufficient documentation

## 2024-11-28 DIAGNOSIS — K209 Esophagitis, unspecified without bleeding: Secondary | ICD-10-CM | POA: Diagnosis not present

## 2024-11-28 DIAGNOSIS — Z7984 Long term (current) use of oral hypoglycemic drugs: Secondary | ICD-10-CM | POA: Diagnosis not present

## 2024-11-28 DIAGNOSIS — Z59868 Other specified financial insecurity: Secondary | ICD-10-CM | POA: Diagnosis not present

## 2024-11-28 DIAGNOSIS — F17210 Nicotine dependence, cigarettes, uncomplicated: Secondary | ICD-10-CM | POA: Insufficient documentation

## 2024-11-28 DIAGNOSIS — R1310 Dysphagia, unspecified: Secondary | ICD-10-CM | POA: Diagnosis present

## 2024-11-28 DIAGNOSIS — Z5941 Food insecurity: Secondary | ICD-10-CM | POA: Insufficient documentation

## 2024-11-28 DIAGNOSIS — E119 Type 2 diabetes mellitus without complications: Secondary | ICD-10-CM | POA: Diagnosis not present

## 2024-11-28 DIAGNOSIS — J449 Chronic obstructive pulmonary disease, unspecified: Secondary | ICD-10-CM | POA: Insufficient documentation

## 2024-11-28 DIAGNOSIS — F319 Bipolar disorder, unspecified: Secondary | ICD-10-CM | POA: Diagnosis not present

## 2024-11-28 DIAGNOSIS — Z9981 Dependence on supplemental oxygen: Secondary | ICD-10-CM | POA: Insufficient documentation

## 2024-11-28 HISTORY — PX: ESOPHAGOGASTRODUODENOSCOPY: SHX5428

## 2024-11-28 HISTORY — PX: BIOPSY OF SKIN SUBCUTANEOUS TISSUE AND/OR MUCOUS MEMBRANE: SHX6741

## 2024-11-28 HISTORY — PX: ESOPHAGEAL DILATION: SHX303

## 2024-11-28 LAB — GLUCOSE, CAPILLARY: Glucose-Capillary: 119 mg/dL — ABNORMAL HIGH (ref 70–99)

## 2024-11-28 SURGERY — EGD (ESOPHAGOGASTRODUODENOSCOPY)
Anesthesia: General

## 2024-11-28 MED ORDER — GLYCOPYRROLATE 0.2 MG/ML IJ SOLN
INTRAMUSCULAR | Status: DC | PRN
  Administered 2024-11-28: .2 mg via INTRAVENOUS

## 2024-11-28 MED ORDER — SODIUM CHLORIDE 0.9 % IV SOLN: INTRAVENOUS | Status: DC

## 2024-11-28 MED ORDER — PROPOFOL 10 MG/ML IV BOLUS
INTRAVENOUS | Status: DC | PRN
  Administered 2024-11-28: 50 mg via INTRAVENOUS
  Administered 2024-11-28: 20 mg via INTRAVENOUS

## 2024-11-28 MED ORDER — LIDOCAINE HCL (CARDIAC) PF 100 MG/5ML IV SOSY
PREFILLED_SYRINGE | INTRAVENOUS | Status: DC | PRN
  Administered 2024-11-28: 20 mg via INTRAVENOUS

## 2024-11-28 MED ORDER — PHENYLEPHRINE 80 MCG/ML (10ML) SYRINGE FOR IV PUSH (FOR BLOOD PRESSURE SUPPORT)
PREFILLED_SYRINGE | INTRAVENOUS | Status: DC | PRN
  Administered 2024-11-28: 160 ug via INTRAVENOUS

## 2024-11-28 MED ORDER — PROPOFOL 500 MG/50ML IV EMUL
INTRAVENOUS | Status: DC | PRN
  Administered 2024-11-28: 125 ug/kg/min via INTRAVENOUS

## 2024-11-28 NOTE — Transfer of Care (Signed)
 Immediate Anesthesia Transfer of Care Note  Patient: Kelly Dillon  Procedure(s) Performed: EGD (ESOPHAGOGASTRODUODENOSCOPY) DILATION, ESOPHAGUS  Patient Location: PACU  Anesthesia Type:General  Level of Consciousness: sedated and drowsy  Airway & Oxygen Therapy: Patient Spontanous Breathing and Patient connected to face mask oxygen  Post-op Assessment: Report given to RN and Post -op Vital signs reviewed and stable  Post vital signs: stable  Last Vitals:  Vitals Value Taken Time  BP 101/66 11/28/24 09:25  Temp    Pulse 74 11/28/24 09:26  Resp 19 11/28/24 09:26  SpO2 99 % 11/28/24 09:26  Vitals shown include unfiled device data.  Last Pain:  Vitals:   11/28/24 0827  TempSrc: Temporal         Complications: No notable events documented.

## 2024-11-28 NOTE — Anesthesia Preprocedure Evaluation (Signed)
 "                                  Anesthesia Evaluation  Patient identified by MRN, date of birth, ID band Patient awake    Reviewed: Allergy & Precautions, NPO status , Patient's Chart, lab work & pertinent test results  History of Anesthesia Complications Negative for: history of anesthetic complications  Airway Mallampati: II  TM Distance: >3 FB Neck ROM: Full    Dental  (+) Edentulous Upper, Edentulous Lower   Pulmonary shortness of breath and at rest, COPD (on 3L home O2),  COPD inhaler and oxygen dependent, Current Smoker and Patient abstained from smoking.   Pulmonary exam normal breath sounds clear to auscultation       Cardiovascular hypertension, Normal cardiovascular exam Rhythm:Regular Rate:Normal  ECG 05/13/24: NSR; ST changes, unchanged from prior   Neuro/Psych  PSYCHIATRIC DISORDERS Anxiety Depression Bipolar Disorder   Chronic pain negative neurological ROS     GI/Hepatic Neg liver ROS,GERD  ,,  Endo/Other  diabetes, Type 2  Obesity   Renal/GU negative Renal ROS  negative genitourinary   Musculoskeletal   Abdominal   Peds  Hematology negative hematology ROS (+)   Anesthesia Other Findings Past Medical History: 08/27/2021: Acute on chronic respiratory failure with hypoxia (HCC) No date: Chronic pain syndrome     Comment:  extensive - see problem list 08/27/2021: Community acquired pneumonia of right lower lobe of lung No date: COPD (chronic obstructive pulmonary disease) (HCC) No date: Diabetes mellitus without complication (HCC) No date: Fibromyalgia     Comment:  went to [facility] pain clinic for monthly lidocaine  infusions No date: Hypertension 08/26/2021: Sepsis [facility])  Past Surgical History: No date: APPENDECTOMY No date: COLONOSCOPY 07/10/2024: COLONOSCOPY; N/A     Comment:  Procedure: COLONOSCOPY;  Surgeon: Therisa Bi, MD;                Location: [facility] ENDOSCOPY;  Service: Gastroenterology;                Laterality: N/A;   IDDM   Spanish Interpreter needed 03/01/2022: COLONOSCOPY WITH PROPOFOL ; N/A     Comment:  Procedure: COLONOSCOPY WITH PROPOFOL ;  Surgeon: Therisa Bi, MD;  Location: [facility] ENDOSCOPY;  Service:               Gastroenterology;  Laterality: N/A; 03/20/2013: EPIDURAL BLOOD PATCH 09/20/2019: ESOPHAGEAL DILATION No date: ESOPHAGOGASTRODUODENOSCOPY 03/01/2022: ESOPHAGOGASTRODUODENOSCOPY; N/A     Comment:  Procedure: ESOPHAGOGASTRODUODENOSCOPY (EGD);  Surgeon:               Therisa Bi, MD;  Location: [facility] ENDOSCOPY;  Service:               Gastroenterology;  Laterality: N/A; 07/10/2024: HEMOSTASIS CLIP PLACEMENT     Comment:  Procedure: CONTROL OF HEMORRHAGE, GI TRACT, ENDOSCOPIC,               BY CLIPPING OR OVERSEWING;  Surgeon: Therisa Bi, MD;                Location: [facility] ENDOSCOPY;  Service: Gastroenterology;; No date: HERNIA REPAIR No date: OOPHORECTOMY 07/10/2024: POLYPECTOMY     Comment:  Procedure: POLYPECTOMY, INTESTINE;  Surgeon: Therisa Bi, MD;  Location: [facility] ENDOSCOPY;  Service:  Gastroenterology;; No date: TUBAL LIGATION 08/15/2018: VENTRAL HERNIA REPAIR; N/A     Comment:  Procedure: HERNIA REPAIR VENTRAL ADULT;  Surgeon: Tye Millet, DO;  Location: [facility] ORS;  Service: General;                Laterality: N/A; 08/18/2018: VENTRAL HERNIA REPAIR; N/A     Comment:  Procedure: LAPAROSCOPIC VENTRAL HERNIA;  Surgeon: Tye Millet, DO;  Location: [facility] ORS;  Service: General;                Laterality: N/A;  BMI    Body Mass Index: 32.85 kg/m      Reproductive/Obstetrics negative OB ROS                              Anesthesia Physical Anesthesia Plan  ASA: 4  Anesthesia Plan: General   Post-op Pain Management: Minimal or no pain anticipated   Induction: Intravenous  PONV Risk Score and Plan: 2 and Propofol  infusion and TIVA  Airway Management Planned: Nasal  Cannula  Additional Equipment: None  Intra-op Plan:   Post-operative Plan:   Informed Consent: I have reviewed the patients History and Physical, chart, labs and discussed the procedure including the risks, benefits and alternatives for the proposed anesthesia with the patient or authorized representative who has indicated his/her understanding and acceptance.     Dental advisory given  Plan Discussed with: CRNA and Surgeon  Anesthesia Plan Comments: (Discussed risks of anesthesia with patient, including possibility of difficulty with spontaneous ventilation under anesthesia necessitating airway intervention, PONV, and rare risks such as cardiac or respiratory or neurological events, and allergic reactions. Discussed the role of CRNA in patient's perioperative care. Patient understands.)        Anesthesia Quick Evaluation  "

## 2024-11-28 NOTE — Op Note (Signed)
 [facility] Gastroenterology Patient Name: Kelly Dillon Procedure Date: 11/28/2024 8:59 AM MRN: [id number] Account #: [id number] Date of Birth: 1961/03/06 Admit Type: Outpatient Age: 64 Room: [facility] ENDO ROOM 2 Gender: Female Note Status: Finalized Instrument Name: Barnie GI Scope [id number] Procedure:             Upper GI endoscopy Indications:           Dysphagia Providers:             Ruel Kung MD, MD Referring MD:          Alan Arrant FNP Medicines:             Monitored Anesthesia Care Complications:         No immediate complications. Procedure:             Pre-Anesthesia Assessment:                        - Prior to the procedure, a History and Physical was                         performed, and patient medications, allergies and                         sensitivities were reviewed. The patient's tolerance                         of previous anesthesia was reviewed.                        - The risks and benefits of the procedure and the                         sedation options and risks were discussed with the                         patient. All questions were answered and informed                         consent was obtained.                        - ASA Grade Assessment: II - A patient with mild                         systemic disease.                        After obtaining informed consent, the endoscope was                         passed under direct vision. Throughout the procedure,                         the patient's blood pressure, pulse, and oxygen                         saturations were monitored continuously. The Endoscope                         was introduced through the  mouth, and advanced to the                         third part of duodenum. The upper GI endoscopy was                         accomplished with ease. The patient tolerated the                         procedure well. Findings:      The examined duodenum was normal.      The  gastroesophageal flap valve was visualized endoscopically and       classified as Hill Grade III (minimal fold, loose to endoscope, hiatal       hernia likely).      LA Grade A (one or more mucosal breaks less than 5 mm, not extending       between tops of 2 mucosal folds) esophagitis with no bleeding was found       at the gastroesophageal junction. A TTS dilator was passed through the       scope. Dilation with a 12-13.5-15 mm balloon dilator was performed to 15       mm. The dilation site was examined and showed no change.      Normal mucosa was found in the upper third of the esophagus. Biopsies       were taken with a cold forceps for histology. Impression:            - Normal examined duodenum.                        - Gastroesophageal flap valve classified as Hill Grade                         III (minimal fold, loose to endoscope, hiatal hernia                         likely).                        - LA Grade A esophagitis with no bleeding. Dilated.                        - Normal mucosa was found in the upper third of the                         esophagus. Biopsied. Recommendation:        - Discharge patient to home (with escort).                        - Resume previous diet.                        - Continue present medications.                        - Await pathology results.                        - Commence on prilosec 40 mg a day , if dysphagia  persists can repeat egd in 8-10 weeks on ppi and                         consider dilating to 18 mmg which i didnt do due to                         present esophagitis . If even after that dysphagia                         persists consider esophageal manometry .Its possible                         mounjaro is causing more reflux and dysphagia Procedure Code(s):     --- Professional ---                        [phone], Esophagogastroduodenoscopy, flexible,                         transoral; with  transendoscopic balloon dilation of                         esophagus (less than 30 mm diameter)                        43239, 59, Esophagogastroduodenoscopy, flexible,                         transoral; with biopsy, single or multiple Diagnosis Code(s):     --- Professional ---                        K20.90, Esophagitis, unspecified without bleeding                        R13.10, Dysphagia, unspecified CPT copyright 2022 American Medical Association. All rights reserved. The codes documented in this report are preliminary and upon coder review may  be revised to meet current compliance requirements. Ruel Kung, MD Ruel Kung MD, MD 11/28/2024 9:23:47 AM This report has been signed electronically. Number of Addenda: 0 Note Initiated On: 11/28/2024 8:59 AM Estimated Blood Loss:  Estimated blood loss: none.      [facility]

## 2024-11-28 NOTE — Anesthesia Postprocedure Evaluation (Signed)
"   Anesthesia Post Note  Patient: Sanoe Hazan  Procedure(s) Performed: EGD (ESOPHAGOGASTRODUODENOSCOPY) DILATION, ESOPHAGUS  Patient location during evaluation: PACU Anesthesia Type: General Level of consciousness: awake and alert Pain management: pain level controlled Vital Signs Assessment: post-procedure vital signs reviewed and stable Respiratory status: spontaneous breathing, nonlabored ventilation, respiratory function stable and patient connected to nasal cannula oxygen Cardiovascular status: blood pressure returned to baseline and stable Postop Assessment: no apparent nausea or vomiting Anesthetic complications: no   No notable events documented.   Last Vitals:  Vitals:   11/28/24 0934 11/28/24 0942  BP: (!) 84/63 97/67  Pulse: 74 73  Resp: 20 (!) 25  Temp:    SpO2: 100% 99%    Last Pain:  Vitals:   11/28/24 0942  TempSrc:   PainSc: 0-No pain                 Debby Mines      "

## 2024-11-28 NOTE — H&P (Signed)
 "  Ruel Kung , MD [address], [place], [place], [address] Phone: [phone] Fax: [phone]  Primary Care Physician:  Orlean Alan HERO, FNP   Pre-Procedure History & Physical: HPI:  Kelly Dillon is a 64 y.o. female is here for an endoscopy    Past Medical History:  Diagnosis Date   Acute on chronic respiratory failure with hypoxia (HCC) 08/27/2021   Chronic pain syndrome    extensive - see problem list   Community acquired pneumonia of right lower lobe of lung 08/27/2021   COPD (chronic obstructive pulmonary disease) (HCC)    Diabetes mellitus without complication (HCC)    Fibromyalgia    went to [facility] pain clinic for monthly lidocaine  infusions   Hypertension    Sepsis (HCC) 08/26/2021    Past Surgical History:  Procedure Laterality Date   APPENDECTOMY     COLONOSCOPY     COLONOSCOPY N/A 07/10/2024   Procedure: COLONOSCOPY;  Surgeon: Kung Ruel, MD;  Location: [facility] ENDOSCOPY;  Service: Gastroenterology;  Laterality: N/A;  IDDM   Spanish Interpreter needed   COLONOSCOPY WITH PROPOFOL  N/A 03/01/2022   Procedure: COLONOSCOPY WITH PROPOFOL ;  Surgeon: Kung Ruel, MD;  Location: [facility] ENDOSCOPY;  Service: Gastroenterology;  Laterality: N/A;   EPIDURAL BLOOD PATCH  03/20/2013   ESOPHAGEAL DILATION  09/20/2019   ESOPHAGOGASTRODUODENOSCOPY     ESOPHAGOGASTRODUODENOSCOPY N/A 03/01/2022   Procedure: ESOPHAGOGASTRODUODENOSCOPY (EGD);  Surgeon: Kung Ruel, MD;  Location: [facility] ENDOSCOPY;  Service: Gastroenterology;  Laterality: N/A;   HEMOSTASIS CLIP PLACEMENT  07/10/2024   Procedure: CONTROL OF HEMORRHAGE, GI TRACT, ENDOSCOPIC, BY CLIPPING OR OVERSEWING;  Surgeon: Kung Ruel, MD;  Location: [facility] ENDOSCOPY;  Service: Gastroenterology;;   HERNIA REPAIR     OOPHORECTOMY     POLYPECTOMY  07/10/2024   Procedure: POLYPECTOMY, INTESTINE;  Surgeon: Kung Ruel, MD;  Location: [facility] ENDOSCOPY;  Service: Gastroenterology;;   TUBAL LIGATION     VENTRAL HERNIA REPAIR  N/A 08/15/2018   Procedure: HERNIA REPAIR VENTRAL ADULT;  Surgeon: Tye Millet, DO;  Location: [facility] ORS;  Service: General;  Laterality: N/A;   VENTRAL HERNIA REPAIR N/A 08/18/2018   Procedure: LAPAROSCOPIC VENTRAL HERNIA;  Surgeon: Tye Millet, DO;  Location: [facility] ORS;  Service: General;  Laterality: N/A;    Prior to Admission medications  Medication Sig Start Date End Date Taking? Authorizing Provider  albuterol  (PROVENTIL ) (2.5 MG/3ML) 0.083% nebulizer solution Inhale 3 mLs into the lungs every 6 (six) hours as needed. 08/14/23   Scoggins, Amber, NP  albuterol  (VENTOLIN  HFA) 108 (90 Base) MCG/ACT inhaler Inhale 2 puffs into the lungs every 6 (six) hours as needed. 08/16/24   Tamea Dedra CROME, MD  ARIPiprazole  (ABILIFY ) 10 MG tablet Take 1 tablet (10 mg total) by mouth 2 (two) times daily. 05/14/24   Josette Ade, MD  atorvastatin  (LIPITOR) 40 MG tablet Take 1 tablet (40 mg total) by mouth daily. 08/14/23   Scoggins, Hospital Doctor, NP  Blood Glucose Monitoring Suppl (ACCU-CHEK GUIDE ME) w/Device KIT Use to check blood glucose three times daily before meals and at bedtime 09/25/23   Orlean Alan HERO, FNP  budesonide -glycopyrrolate -formoterol  (BREZTRI  AEROSPHERE) 160-9-4.8 MCG/ACT AERO inhaler Inhale 2 puffs into the lungs in the morning and at bedtime. 08/16/24   Tamea Dedra CROME, MD  budesonide -glycopyrrolate -formoterol  (BREZTRI  AEROSPHERE) 160-9-4.8 MCG/ACT AERO inhaler Inhale 2 puffs into the lungs in the morning and at bedtime. 08/16/24   Tamea Dedra CROME, MD  COMFORT EZ PEN NEEDLES 32G X 4 MM MISC 1 Needle in  the morning, at noon, in the evening, and at bedtime. 05/13/22   [provider]  empagliflozin  (JARDIANCE ) 25 MG TABS tablet Take 1 tablet (25 mg total) by mouth daily. 09/24/24   Fernand Denyse LABOR, MD  folic acid  (FOLVITE ) 1 MG tablet Take 1 tablet (1 mg total) by mouth daily. 05/14/24   Josette Ade, MD  glucose blood (ACCU-CHEK GUIDE) test strip Use to check blood glucose  before meals and at bedtime 09/25/23   Orlean Alan HERO, FNP  Lancets [facility] ULTRASOFT) lancets Use to check blood sugars twice daily. 11/10/23   Orlean Alan HERO, FNP  lithium  carbonate (LITHOBID ) 300 MG ER tablet Take 1 tablet (300 mg total) by mouth every 12 (twelve) hours. 05/14/24   Josette Ade, MD  losartan  (COZAAR ) 50 MG tablet Take 1 tablet (50 mg total) by mouth daily. 01/12/24   Orlean Alan HERO, FNP  methocarbamol  (ROBAXIN ) 500 MG tablet Take 1 tablet (500 mg total) by mouth 3 (three) times daily. 09/24/24   Orlean Alan HERO, FNP  montelukast  (SINGULAIR ) 10 MG tablet Take 1 tablet by mouth daily.    [provider]  MOUNJARO 5 MG/0.5ML Pen Inject 5 mg into the skin once a week. 08/20/24   [provider]  Multiple Vitamins-Minerals (CENTRUM SILVER 50+WOMEN) TABS Take 1 capsule by mouth See admin instructions.    [provider]  nitroGLYCERIN  (NITROSTAT ) 0.4 MG SL tablet Place 1 tablet (0.4 mg total) under the tongue every 5 (five) minutes as needed for chest pain. 08/14/23 09/24/24  Scoggins, Amber, NP  NOVOLOG  FLEXPEN 100 UNIT/ML FlexPen Inject 7 Units into the skin 3 (three) times daily with meals. 02/26/24   [provider]  oxyCODONE -acetaminophen  (PERCOCET) 7.5-325 MG tablet Take 1 tablet by mouth every 6 (six) hours as needed for severe pain (pain score 7-10). 09/24/24   Orlean Alan HERO, FNP  OXYGEN 3LPM    [provider]  pregabalin  (LYRICA ) 200 MG capsule TAKE 1 CAPSULE BY MOUTH IN THE MORNING, AT NOON, AND AT BEDTIME. 07/09/24   Fernand Fredy RAMAN, MD  traZODone  (DESYREL ) 100 MG tablet Take 200 mg by mouth at bedtime.    [provider]  TRESIBA  FLEXTOUCH 100 UNIT/ML FlexTouch Pen Inject 2 Units into the skin at bedtime. 05/14/24   Josette Ade, MD  Trospium  Chloride 60 MG CP24 Take 1 capsule (60 mg total) by mouth in the morning and at bedtime. 09/23/24   Guadlupe Lianne DASEN, MD  venlafaxine  XR (EFFEXOR -XR) 150 MG 24 hr capsule  TAKE 2 CAPSULES BY MOUTH DAILY. 09/12/24   Orlean Alan HERO, FNP  Vibegron  (GEMTESA ) 75 MG TABS Take 1 tablet (75 mg total) by mouth daily. 09/23/24   Guadlupe Lianne DASEN, MD    Allergies as of 11/26/2024   (No Known Allergies)    Family History  Problem Relation Age of Onset   Stomach cancer Father    Lung cancer Maternal Uncle    Bladder Cancer Neg Hx    Renal cancer Neg Hx    Uterine cancer Neg Hx     Social History   Socioeconomic History   Marital status: Divorced    Spouse name: Not on file   Number of children: Not on file   Years of education: Not on file   Highest education level: Not on file  Occupational History   Not on file  Tobacco Use   Smoking status: Every Day    Current packs/day: 0.50    Average packs/day:  2.0 packs/day for 51.0 years (100.0 ttl pk-yrs)    Types: Cigarettes    Start date: 48   Smokeless tobacco: Never  Vaping Use   Vaping status: Never Used  Substance and Sexual Activity   Alcohol use: Yes    Comment: 09-23-24 drinks occ / gallon of liqour per day x 2 weeks   Drug use: Yes    Comment: CHRONIC PAIN ,ON PERCOCET   Sexual activity: Not Currently  Other Topics Concern   Not on file  Social History Narrative   Not on file   Social Drivers of Health   Tobacco Use: High Risk (11/05/2024)   Patient History    Smoking Tobacco Use: Every Day    Smokeless Tobacco Use: Never    Passive Exposure: Not on file  Financial Resource Strain: High Risk (06/24/2024)   Received from [facility]   Overall Financial Resource Strain (CARDIA)    Difficulty of Paying Living Expenses: Hard  Food Insecurity: Food Insecurity Present (06/24/2024)   Received from [facility]   Epic    Within the past 12 months, you worried that your food would run out before you got the money to buy more.: Sometimes true    Within the past 12 months, the food you bought just didn't last and you didn't have money to get more.: Never true   Transportation Needs: No Transportation Needs (06/24/2024)   Received [facility] - Transportation    In the past 12 months, has lack of transportation kept you from medical appointments or from getting medications?: No    Lack of Transportation (Non-Medical): No  Physical Activity: Not on file  Stress: Not on file  Social Connections: Not on file  Intimate Partner Violence: Not At Risk (05/15/2024)   Epic    Fear of Current or Ex-Partner: No    Emotionally Abused: No    Physically Abused: No    Sexually Abused: No  Depression (PHQ2-9): Low Risk (06/12/2024)   Depression (PHQ2-9)    PHQ-2 Score: 3  Alcohol Screen: Not on file  Housing: High Risk (06/24/2024)   Received from [facility]   Epic    In the last 12 months, was there a time when you were not able to pay the mortgage or rent on time?: Yes    In the past 12 months, how many times have you moved where you were living?: 0    At any time in the past 12 months, were you homeless or living in a shelter (including now)?: No  Utilities: At Risk (06/24/2024)   Received from [facility]   Epic    In the past 12 months has the electric, gas, oil, or water company threatened to shut off services in your home?: Yes  Health Literacy: Not on file    Review of Systems: See HPI, otherwise negative ROS  Physical Exam: There were no vitals taken for this visit. General:   Alert,  pleasant and cooperative in NAD Head:  Normocephalic and atraumatic. Neck:  Supple; no masses or thyromegaly. Lungs:  Clear throughout to auscultation, normal respiratory effort.    Heart:  +S1, +S2, Regular rate and rhythm, No edema. Abdomen:  Soft, nontender and nondistended. Normal bowel sounds, without guarding, and without rebound.   Neurologic:  Alert and  oriented x4;  grossly normal neurologically.  Impression/Plan: Kelly Dillon is here for an endoscopy  to be  performed for   evaluation of dysphagia    Risks, benefits, limitations, and alternatives regarding endoscopy have been reviewed with the patient.  Questions have been answered.  All parties agreeable.   Ruel Kung, MD  11/28/2024, 8:20 AM  "

## 2024-11-29 LAB — SURGICAL PATHOLOGY

## 2024-12-02 ENCOUNTER — Encounter: Payer: Self-pay | Admitting: Family

## 2024-12-02 ENCOUNTER — Ambulatory Visit
Admission: RE | Admit: 2024-12-02 | Discharge: 2024-12-02 | Disposition: A | Source: Ambulatory Visit | Attending: Family | Admitting: Family

## 2024-12-02 ENCOUNTER — Ambulatory Visit
Admission: RE | Admit: 2024-12-02 | Discharge: 2024-12-02 | Disposition: A | Discharge status: Home / Self Care | Attending: Family | Admitting: Family

## 2024-12-02 ENCOUNTER — Ambulatory Visit: Admitting: Family

## 2024-12-02 VITALS — BP 88/64 | HR 95 | Temp 96.7°F | Ht <= 58 in | Wt 159.2 lb

## 2024-12-02 DIAGNOSIS — M25572 Pain in left ankle and joints of left foot: Secondary | ICD-10-CM

## 2024-12-02 DIAGNOSIS — J069 Acute upper respiratory infection, unspecified: Secondary | ICD-10-CM

## 2024-12-02 DIAGNOSIS — G894 Chronic pain syndrome: Secondary | ICD-10-CM | POA: Diagnosis not present

## 2024-12-02 DIAGNOSIS — M79605 Pain in left leg: Secondary | ICD-10-CM

## 2024-12-02 DIAGNOSIS — M25561 Pain in right knee: Secondary | ICD-10-CM

## 2024-12-02 DIAGNOSIS — G8929 Other chronic pain: Secondary | ICD-10-CM

## 2024-12-02 DIAGNOSIS — M79601 Pain in right arm: Secondary | ICD-10-CM

## 2024-12-02 DIAGNOSIS — I10 Essential (primary) hypertension: Secondary | ICD-10-CM | POA: Diagnosis not present

## 2024-12-02 DIAGNOSIS — R0602 Shortness of breath: Secondary | ICD-10-CM | POA: Diagnosis present

## 2024-12-02 DIAGNOSIS — M79604 Pain in right leg: Secondary | ICD-10-CM | POA: Diagnosis not present

## 2024-12-02 DIAGNOSIS — M542 Cervicalgia: Secondary | ICD-10-CM

## 2024-12-02 DIAGNOSIS — R1084 Generalized abdominal pain: Secondary | ICD-10-CM | POA: Diagnosis not present

## 2024-12-02 DIAGNOSIS — M25571 Pain in right ankle and joints of right foot: Secondary | ICD-10-CM

## 2024-12-02 DIAGNOSIS — M25562 Pain in left knee: Secondary | ICD-10-CM

## 2024-12-02 DIAGNOSIS — M79602 Pain in left arm: Secondary | ICD-10-CM

## 2024-12-02 LAB — POCT XPERT XPRESS SARS COVID-2/FLU/RSV
FLU A: NEGATIVE
FLU B: NEGATIVE
RSV RNA, PCR: NEGATIVE
SARS Coronavirus 2: NEGATIVE

## 2024-12-02 MED ORDER — LEVOFLOXACIN 500 MG PO TABS: 500.0000 mg | ORAL_TABLET | Freq: Every day | ORAL | 0 refills | Status: AC

## 2024-12-02 NOTE — Progress Notes (Signed)
 "  Acute Office Visit  Subjective:     Patient ID: Kelly Dillon, female    DOB: 01-11-1961, 64 y.o.   MRN: [id number]  Patient is in today for  Chief Complaint  Patient presents with   Acute Visit    Head cold, green mucus, fatigue, headache    Was in [place]  for Christmas, was really cold there.  Has been coughing up green/yellow sputum since then.    URI  This is a new problem. The current episode started 1 to 4 weeks ago. The problem has been unchanged. There has been no fever. Associated symptoms include congestion, coughing, rhinorrhea, sinus pain and a sore throat. She has tried acetaminophen , decongestant, antihistamine, eating, increased fluids, NSAIDs, steam and sleep for the symptoms. The treatment provided no relief.     Review of Systems  HENT:  Positive for congestion, rhinorrhea, sinus pain and sore throat.   Respiratory:  Positive for cough.   All other systems reviewed and are negative.       Objective:    BP (!) 88/64   Pulse 95   Temp (!) 96.7 F (35.9 C) (Tympanic)   Ht 4' 9 (1.448 m)   Wt 159 lb 3.2 oz (72.2 kg)   SpO2 99%   BMI 34.45 kg/m   Physical Exam Vitals and nursing note reviewed.  Constitutional:      Appearance: Normal appearance. She is normal weight.  HENT:     Head: Normocephalic.     Nose: Congestion and rhinorrhea present.  Eyes:     Extraocular Movements: Extraocular movements intact.     Conjunctiva/sclera: Conjunctivae normal.     Pupils: Pupils are equal, round, and reactive to light.  Cardiovascular:     Rate and Rhythm: Normal rate.  Pulmonary:     Effort: Pulmonary effort is normal.     Breath sounds: Wheezing present.  Neurological:     General: No focal deficit present.     Mental Status: She is alert and oriented to person, place, and time. Mental status is at baseline.  Psychiatric:        Mood and Affect: Mood normal.        Behavior: Behavior normal.        Thought Content: Thought content normal.         Judgment: Judgment normal.     No results found for any visits on 12/02/24.  Recent Results (from the past 2160 hours)  ECHOCARDIOGRAM COMPLETE     Status: None   Collection Time: 09/19/24  1:54 PM  Result Value Ref Range   Ao pk vel 1.20 m/s   AV Area VTI 1.93 cm2   AR max vel 1.85 cm2   AV Mean grad 3.0 mmHg   AV Peak grad 5.8 mmHg   Single Plane A2C EF 55.8 %   Single Plane A4C EF 62.0 %   Calc EF 58.1 %   S' Lateral 2.10 cm   AV Area mean vel 1.73 cm2   Area-P 1/2 3.85 cm2   MV VTI 1.98 cm2   Est EF 60 - 65%   ToxASSURE Select 13 (MW), Urine     Status: None   Collection Time: 09/24/24  3:31 PM  Result Value Ref Range   Summary FINAL     Comment: ==================================================================== ToxASSURE Select 13 (MW) ==================================================================== Specimen Alert Note: Urinary creatinine is low; ability to detect some drugs may be compromised. Interpret results with caution. (Creatinine) ==================================================================== Test  Result       Flag       Units    NO DRUGS DETECTED. ==================================================================== Test                      Result    Flag   Units      Ref Range   Creatinine              13        LL     mg/dL      >=79 ==================================================================== Declared Medications:  Medication list was not provided. ==================================================================== For clinical consultation, please call [phone]. ====================================================================   Surgical pathology     Status: None   Collection Time: 11/28/24 12:00 AM  Result Value Ref Range   SURGICAL PATHOLOGY      SURGICAL PATHOLOGY [facility] [address] [place], [place] [address] Telephone [phone] or [phone] Fax [phone]  REPORT OF SURGICAL PATHOLOGY   Accession #: [id number] Patient Name: Kelly Dillon Visit # : [id number]  MRN: [id number] Physician: Therisa Bi DOB/Age 03-Apr-1961 (Age: 8) Gender: F Collected Date: 11/28/2024 Received Date: 11/28/2024  FINAL DIAGNOSIS       1. Esophagus, biopsy, cbx :       - SQUAMOUS MUCOSA WITH SPONGIOSIS AND REACTIVE CHANGES COMPATIBLE WITH REFLUX IN      THE APPROPRIATE CLINICAL SETTING.      - NEGATIVE FOR INTRAEPITHELIAL EOSINOPHILS, DYSPLASIA, AND MALIGNANCY.       DATE SIGNED OUT: 11/29/2024 ELECTRONIC SIGNATURE : Janel Md, Rexene , Pathologist, Electronic Signature  MICROSCOPIC DESCRIPTION  CASE COMMENTS STAINS USED IN DIAGNOSIS: H&E    CLINICAL HISTORY  SPECIMEN(S) OBTAINED 1. Esophagus, biopsy, Cbx  SPECIMEN COMMENTS: 1. Eosinophilic esophagitis SPECIMEN CLIN ICAL INFORMATION:    Gross Description 1. In formalin are three pink gray soft tissues which range from 0.3 to 0.4 cm. Submitted in one block.   (SSW:kh 11/28/24)        Report signed out from the following location(s) [place] [address] [place], [place] [address] CLIA #: [id number]  [facility] [address] [place], [place] [address] CLIA #: [id number]   Glucose, capillary     Status: Abnormal   Collection Time: 11/28/24  8:44 AM  Result Value Ref Range   Glucose-Capillary 119 (H) 70 - 99 mg/dL    Comment: Glucose reference range applies only to samples taken after fasting for at least 8 hours.    Allergies as of 12/02/2024   No Known Allergies      Medication List        Accurate as of December 02, 2024  9:19 AM. If you have any questions, ask your nurse or doctor.          Accu-Chek Guide Me w/Device Kit Use to check blood glucose three times daily before meals and at bedtime   Accu-Chek Guide test strip Generic drug: glucose blood Use to check blood glucose before meals and at  bedtime   albuterol  (2.5 MG/3ML) 0.083% nebulizer solution Commonly known as: PROVENTIL  Inhale 3 mLs into the lungs every 6 (six) hours as needed.   albuterol  108 (90 Base) MCG/ACT inhaler Commonly known as: VENTOLIN  HFA Inhale 2 puffs into the lungs every 6 (six) hours as needed.   ARIPiprazole  10 MG tablet Commonly known as: ABILIFY  Tome 1 tableta (10 mg en total) por va oral 2 (dos) veces  al da. (Take 1 tablet (10 mg total) by mouth 2 (two) times daily.)   atorvastatin  40 MG tablet Commonly known as: LIPITOR Take 1 tablet (40 mg total) by mouth daily.   Breztri  Aerosphere 160-9-4.8 MCG/ACT Aero inhaler Generic drug: budesonide -glycopyrrolate -formoterol  Inhale 2 puffs into the lungs in the morning and at bedtime.   Breztri  Aerosphere 160-9-4.8 MCG/ACT Aero inhaler Generic drug: budesonide -glycopyrrolate -formoterol  Inhale 2 puffs into the lungs in the morning and at bedtime.   Centrum Silver 50+Women Tabs Take 1 capsule by mouth See admin instructions.   Comfort EZ Pen Needles 32G X 4 MM Misc Generic drug: Insulin  Pen Needle 1 Needle in the morning, at noon, in the evening, and at bedtime.   empagliflozin  25 MG Tabs tablet Commonly known as: JARDIANCE  Take 1 tablet (25 mg total) by mouth daily.   folic acid  1 MG tablet Commonly known as: FOLVITE  Tome 1 tableta (1 mg en total) por va oral diariamente. (Take 1 tablet (1 mg total) by mouth daily.)   Gemtesa  75 MG Tabs Generic drug: Vibegron  Take 1 tablet (75 mg total) by mouth daily.   lithium  carbonate 300 MG ER tablet Commonly known as: LITHOBID  Tome 1 tableta (300 mg en total) por va oral cada 12 (doce) horas. (Take 1 tablet (300 mg total) by mouth every 12 (twelve) hours.)   losartan  50 MG tablet Commonly known as: COZAAR  Take 1 tablet (50 mg total) by mouth daily.   methocarbamol  500 MG tablet Commonly known as: ROBAXIN  Tome 1 tableta (500 mg en total) por va oral 3 (tres) veces al [facility]. (Take 1  tablet (500 mg total) by mouth 3 (three) times daily.)   montelukast  10 MG tablet Commonly known as: SINGULAIR  Take 1 tablet by mouth daily.   Mounjaro 5 MG/0.5ML Pen Generic drug: tirzepatide Inject 5 mg into the skin once a week.   nitroGLYCERIN  0.4 MG SL tablet Commonly known as: Nitrostat  Place 1 tablet (0.4 mg total) under the tongue every 5 (five) minutes as needed for chest pain.   NovoLOG  FlexPen 100 UNIT/ML FlexPen Generic drug: insulin  aspart Inject 7 Units into the skin 3 (three) times daily with meals.   onetouch ultrasoft lancets Use to check blood sugars twice daily.   oxyCODONE -acetaminophen  7.5-325 MG tablet Commonly known as: PERCOCET Take 1 tablet by mouth every 6 (six) hours as needed for severe pain (pain score 7-10).   OXYGEN 3LPM   pregabalin  200 MG capsule Commonly known as: LYRICA  TAKE 1 CAPSULE BY MOUTH IN THE MORNING, AT NOON, AND AT BEDTIME.   traZODone  100 MG tablet Commonly known as: DESYREL  Take 200 mg by mouth at bedtime.   Tresiba  FlexTouch 100 UNIT/ML FlexTouch Pen Generic drug: insulin  degludec Inject 2 Units into the skin at bedtime.   Trospium  Chloride 60 MG Cp24 Take 1 capsule (60 mg total) by mouth in the morning and at bedtime.   venlafaxine  XR 150 MG 24 hr capsule Commonly known as: EFFEXOR -XR TAKE 2 CAPSULES BY MOUTH DAILY.            Assessment & Plan:   Assessment & Plan Shortness of breath XR chest ordered today.  Will call with results when available.  Will send medication based on this .  Upper respiratory tract infection, unspecified type COVID/Flu/RSV test performed in office today.  Will call with results when available.   Will send meds based on result.   Essential hypertension Patient stable.  Well controlled with current therapy.   Continue current  meds.   Chronic ankle pain, bilateral (Secondary Area of Pain) (L>R) Chronic pain syndrome Chronic neck pain(midline) Chronic pain of both lower  extremities (L>R) Chronic pain of both upper extremities Chronic generalized abdominal pain (Primary Area of Pain) Chronic pain of both knees (Tertiary Area of Pain) (L>R) Sending refill of pain medication for pt.  She has been given contact information for the pain clinic we previously referred her to.   Will only send for 1 month.     Return as previously scheduled.  Total time spent: 20 minutes  ALAN CHRISTELLA ARRANT, FNP  12/02/2024   This document may have been prepared by [facility] Voice Recognition software and as such may include unintentional dictation errors.  "

## 2024-12-02 NOTE — Assessment & Plan Note (Signed)
 Sending refill of pain medication for pt.  She has been given contact information for the pain clinic we previously referred her to.   Will only send for 1 month.
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
## 2024-12-02 NOTE — Assessment & Plan Note (Signed)
 XR chest ordered today.  Will call with results when available.  Will send medication based on this .

## 2024-12-02 NOTE — Assessment & Plan Note (Signed)
 Patient stable.  Well controlled with current therapy.   Continue current meds.

## 2024-12-03 ENCOUNTER — Encounter: Attending: Obstetrics | Admitting: Physical Therapy

## 2024-12-03 ENCOUNTER — Other Ambulatory Visit: Payer: Self-pay

## 2024-12-03 ENCOUNTER — Encounter: Payer: Self-pay | Admitting: Physical Therapy

## 2024-12-03 DIAGNOSIS — R1030 Lower abdominal pain, unspecified: Secondary | ICD-10-CM | POA: Insufficient documentation

## 2024-12-03 DIAGNOSIS — R102 Pelvic and perineal pain unspecified side: Secondary | ICD-10-CM | POA: Insufficient documentation

## 2024-12-03 DIAGNOSIS — R159 Full incontinence of feces: Secondary | ICD-10-CM | POA: Diagnosis present

## 2024-12-03 DIAGNOSIS — R3915 Urgency of urination: Secondary | ICD-10-CM | POA: Insufficient documentation

## 2024-12-03 DIAGNOSIS — M6281 Muscle weakness (generalized): Secondary | ICD-10-CM | POA: Insufficient documentation

## 2024-12-10 ENCOUNTER — Other Ambulatory Visit: Payer: Self-pay | Admitting: Family

## 2024-12-10 MED ORDER — OXYCODONE-ACETAMINOPHEN 7.5-325 MG PO TABS: 1.0000 | ORAL_TABLET | Freq: Four times a day (QID) | ORAL | 0 refills | Status: AC | PRN

## 2024-12-17 ENCOUNTER — Encounter: Admitting: Physical Therapy

## 2024-12-18 ENCOUNTER — Ambulatory Visit: Payer: Self-pay | Admitting: Gastroenterology

## 2024-12-19 NOTE — Progress Notes (Unsigned)
 [facility] Return Visit  SUBJECTIVE  History of Present Illness: Kelly Dillon is a 64 y.o. female seen in follow-up for mixed urinary incontinence, nocturia, tobacco use, fecal incontinence, dyspareunia, and BMI. Plan at last visit was tobacco cessation, continue Trospium  and Gemtesa , fluid management, start pelvic floor PT, and referral to medical weight management.   Spanish interpreter ***  Reports doing much better with Trospium  and Gemtesa , ***reports still having Gemtesa  samples UUI 2x/days resolved, voids 4-5x/night Denies taking medication at bedtime Drinks fluids for pills around 10pm, sleeps within an hour.  Leaks with coughing/sneezing 5-6x/day reduced to 3x/week with tobacco use and emphysema Tobacco use 1 pack/day down to ***3 cigarettes/day Pad use: 5-6 adult diapers/day down to 1 diaper at night and 4 pads/day for hygiene with droplets of urine Reports using barrier ointment due to vulvar irritation Drinks: 72oz water/day down to 64-80oz, 12-24oz of coke zero down to 12oz/day, intermittent 24oz gatorade  Tried self directed Kegel exercises, pelvic floor PT around 5yrs ago in [place]   Tried combination Oxybutynin  5mg  and Trospium  20mg  once a day without relief  Reports perineal pressure for a few months, reports 2x in the last week.  Ambulates with walker  Rare FI 2-3x in 15 years Underwent colonoscopy 07/10/24 with 12mm polyp resected with 2 clipos placed and 1 diverticula noted in rectum Last HbA1C 5.2 in 09/19/24 6/20-24/25 ED admission for SI and EtOH abuse, increased wheezing and work of breathing with dyspnea. Productive cough.   Past Medical History: Patient  has a past medical history of Acute on chronic respiratory failure with hypoxia (HCC) (08/27/2021), Chronic pain syndrome, Community acquired pneumonia of right lower lobe of lung (08/27/2021), COPD (chronic obstructive pulmonary disease) (HCC), Diabetes mellitus without complication  (HCC), Fibromyalgia, Hypertension, and Sepsis (HCC) (08/26/2021).   Past Surgical History: She  has a past surgical history that includes Hernia repair; Tubal ligation; Appendectomy; Ventral hernia repair (N/A, 08/15/2018); Ventral hernia repair (N/A, 08/18/2018); Colonoscopy; Esophagogastroduodenoscopy; Colonoscopy with propofol  (N/A, 03/01/2022); Esophagogastroduodenoscopy (N/A, 03/01/2022); Epidural blood patch (03/20/2013); Esophageal dilation (09/20/2019); Oophorectomy; Colonoscopy (N/A, 07/10/2024); Polypectomy (07/10/2024); Hemostasis clip placement (07/10/2024); Esophagogastroduodenoscopy (N/A, 11/28/2024); Esophageal dilation (11/28/2024); and Biopsy of skin subcutaneous tissue and/or mucous membrane (11/28/2024).   Medications: She has a current medication list which includes the following prescription(s): albuterol , albuterol , aripiprazole , atorvastatin , accu-chek guide me, breztri  aerosphere, breztri  aerosphere, comfort ez pen needles, empagliflozin , folic acid , accu-chek guide, onetouch ultrasoft, lithium  carbonate, losartan , methocarbamol , montelukast , mounjaro, centrum silver 50+women, nitroglycerin , novolog  flexpen, oxycodone -acetaminophen , oxygen-helium, pregabalin , trazodone , tresiba  flextouch, trospium  chloride, venlafaxine  xr, and gemtesa .   Allergies: Patient has no known allergies.   Social History: Patient  reports that she has been smoking cigarettes. She started smoking about 51 years ago. She has a 100 pack-year smoking history. She has never used smokeless tobacco. She reports current alcohol use. She reports current drug use.     OBJECTIVE     Physical Exam: There were no vitals filed for this visit.  Gen: No apparent distress, A&O x 3. ***      ASSESSMENT AND PLAN    Kelly Dillon is a 64 y.o. with:  No diagnosis found.   There are no diagnoses linked to this encounter. Time spent: I spent *** minutes dedicated to the care of this patient on the date of this encounter  to include pre-visit review of records, face-to-face time with the patient discussing mixed urinary incontinence, nocturia, tobacco use, fecal incontinence, dyspareunia, BMI, and post visit documentation and ordering medication/ testing.  Kelly ONEIDA Gillis, MD

## 2024-12-20 ENCOUNTER — Ambulatory Visit: Admitting: Obstetrics

## 2024-12-24 ENCOUNTER — Encounter: Admitting: Physical Therapy

## 2024-12-26 ENCOUNTER — Ambulatory Visit: Payer: Self-pay

## 2024-12-27 ENCOUNTER — Other Ambulatory Visit: Payer: Self-pay | Admitting: Pulmonary Disease

## 2024-12-31 ENCOUNTER — Encounter: Admitting: Physical Therapy

## 2024-12-31 ENCOUNTER — Ambulatory Visit: Admitting: Cardiovascular Disease

## 2025-01-07 ENCOUNTER — Encounter: Admitting: Physical Therapy

## 2025-01-07 ENCOUNTER — Ambulatory Visit: Admitting: Obstetrics

## 2025-01-08 ENCOUNTER — Encounter: Admitting: Dietician

## 2025-01-20 ENCOUNTER — Ambulatory Visit (HOSPITAL_COMMUNITY): Admitting: Student in an Organized Health Care Education/Training Program

## 2025-01-30 ENCOUNTER — Ambulatory Visit: Admitting: Family
